# Patient Record
Sex: Male | Born: 1950
Health system: Southern US, Community
[De-identification: ages and names within clinical notes are randomized; demographics above are authoritative.]

## PROBLEM LIST (undated history)

## (undated) DIAGNOSIS — Z860100 Personal history of colon polyps, unspecified: Secondary | ICD-10-CM

## (undated) DIAGNOSIS — T7840XA Allergy, unspecified, initial encounter: Secondary | ICD-10-CM

## (undated) DIAGNOSIS — C801 Malignant (primary) neoplasm, unspecified: Secondary | ICD-10-CM

## (undated) DIAGNOSIS — E119 Type 2 diabetes mellitus without complications: Secondary | ICD-10-CM

## (undated) DIAGNOSIS — L989 Disorder of the skin and subcutaneous tissue, unspecified: Secondary | ICD-10-CM

## (undated) DIAGNOSIS — E785 Hyperlipidemia, unspecified: Secondary | ICD-10-CM

## (undated) DIAGNOSIS — N529 Male erectile dysfunction, unspecified: Secondary | ICD-10-CM

## (undated) DIAGNOSIS — D649 Anemia, unspecified: Secondary | ICD-10-CM

## (undated) DIAGNOSIS — I10 Essential (primary) hypertension: Secondary | ICD-10-CM

## (undated) DIAGNOSIS — K219 Gastro-esophageal reflux disease without esophagitis: Secondary | ICD-10-CM

## (undated) DIAGNOSIS — J309 Allergic rhinitis, unspecified: Secondary | ICD-10-CM

## (undated) DIAGNOSIS — M1990 Unspecified osteoarthritis, unspecified site: Secondary | ICD-10-CM

## (undated) DIAGNOSIS — E669 Obesity, unspecified: Secondary | ICD-10-CM

## (undated) HISTORY — DX: Essential (primary) hypertension: I10

## (undated) HISTORY — PX: TONSILLECTOMY AND ADENOIDECTOMY: SHX28

## (undated) HISTORY — DX: Personal history of colonic polyps: Z86.010

## (undated) HISTORY — DX: Anemia, unspecified: D64.9

## (undated) HISTORY — DX: Male erectile dysfunction, unspecified: N52.9

## (undated) HISTORY — DX: Gastro-esophageal reflux disease without esophagitis: K21.9

## (undated) HISTORY — DX: Type 2 diabetes mellitus without complications: E11.9

## (undated) HISTORY — PX: POLYPECTOMY: SHX149

## (undated) HISTORY — PX: HERNIA REPAIR: SHX51

## (undated) HISTORY — PX: EYE SURGERY: SHX253

## (undated) HISTORY — DX: Obesity, unspecified: E66.9

## (undated) HISTORY — DX: Allergy, unspecified, initial encounter: T78.40XA

## (undated) HISTORY — DX: Hyperlipidemia, unspecified: E78.5

## (undated) HISTORY — PX: APPENDECTOMY: SHX54

## (undated) HISTORY — DX: Malignant (primary) neoplasm, unspecified: C80.1

## (undated) HISTORY — DX: Unspecified osteoarthritis, unspecified site: M19.90

## (undated) HISTORY — DX: Personal history of colon polyps, unspecified: Z86.0100

## (undated) HISTORY — DX: Allergic rhinitis, unspecified: J30.9

## (undated) HISTORY — PX: JOINT REPLACEMENT: SHX530

## (undated) HISTORY — DX: Disorder of the skin and subcutaneous tissue, unspecified: L98.9

## (undated) HISTORY — PX: REPLACEMENT TOTAL KNEE: SUR1224

---

## 1990-08-17 HISTORY — PX: OTHER SURGICAL HISTORY: SHX169

## 2002-01-06 ENCOUNTER — Ambulatory Visit (HOSPITAL_COMMUNITY): Admission: RE | Admit: 2002-01-06 | Discharge: 2002-01-06 | Payer: Self-pay | Admitting: Gastroenterology

## 2002-01-06 ENCOUNTER — Encounter (INDEPENDENT_AMBULATORY_CARE_PROVIDER_SITE_OTHER): Payer: Self-pay

## 2003-04-24 ENCOUNTER — Encounter: Admission: RE | Admit: 2003-04-24 | Discharge: 2003-07-23 | Payer: Self-pay | Admitting: Family Medicine

## 2004-04-26 ENCOUNTER — Emergency Department (HOSPITAL_COMMUNITY): Admission: EM | Admit: 2004-04-26 | Discharge: 2004-04-26 | Payer: Self-pay | Admitting: Emergency Medicine

## 2006-08-17 DIAGNOSIS — C801 Malignant (primary) neoplasm, unspecified: Secondary | ICD-10-CM

## 2006-08-17 HISTORY — DX: Malignant (primary) neoplasm, unspecified: C80.1

## 2006-08-17 HISTORY — PX: OTHER SURGICAL HISTORY: SHX169

## 2006-09-23 ENCOUNTER — Ambulatory Visit (HOSPITAL_BASED_OUTPATIENT_CLINIC_OR_DEPARTMENT_OTHER): Admission: RE | Admit: 2006-09-23 | Discharge: 2006-09-23 | Payer: Self-pay | Admitting: Orthopedic Surgery

## 2007-04-14 DIAGNOSIS — N529 Male erectile dysfunction, unspecified: Secondary | ICD-10-CM

## 2007-04-14 DIAGNOSIS — L989 Disorder of the skin and subcutaneous tissue, unspecified: Secondary | ICD-10-CM

## 2007-04-14 HISTORY — DX: Disorder of the skin and subcutaneous tissue, unspecified: L98.9

## 2007-04-14 HISTORY — DX: Male erectile dysfunction, unspecified: N52.9

## 2007-04-20 ENCOUNTER — Ambulatory Visit: Payer: Self-pay | Admitting: Family Medicine

## 2007-08-18 HISTORY — PX: COLONOSCOPY: SHX174

## 2007-08-18 LAB — HM COLONOSCOPY

## 2010-06-20 ENCOUNTER — Encounter: Admission: RE | Admit: 2010-06-20 | Discharge: 2010-06-25 | Payer: Self-pay | Admitting: Orthopedic Surgery

## 2010-06-25 ENCOUNTER — Encounter: Payer: Self-pay | Admitting: Orthopedic Surgery

## 2010-07-17 ENCOUNTER — Encounter: Payer: Self-pay | Admitting: Orthopedic Surgery

## 2010-08-17 ENCOUNTER — Encounter: Payer: Self-pay | Admitting: Orthopedic Surgery

## 2011-01-02 NOTE — Op Note (Signed)
Joseph Collier, Joseph Collier                ACCOUNT NO.:  [id number]   MEDICAL RECORD NO.:  [id number]          PATIENT TYPE:  AMB   LOCATION:  DSC                          FACILITY:  [facility]   PHYSICIAN:  Rodney A. Mortenson, M.D.DATE OF BIRTH:  [birth year]   DATE OF PROCEDURE:  09/23/2006  DATE OF DISCHARGE:                               OPERATIVE REPORT   JUSTIFICATION:  A 60 year old male who has had intermittent problems  with his left knee for a number of years, but several months ago as he  got out of a chair, he had sudden severe pain along the medial joint  line.  This has persisted along the joint line especially with  weightbearing.  He has trouble going up and down stairs.  Examination  shows hyperflexion of the left knee causes severe pain along medial  joint line.  An MRI was done that shows a vertical displaced tear of the  body and posterior horn of the medial meniscus and displaced fragments.  There is tricompartmental degenerative changes, marked degree in the  medial tibial femoral space.  Because of persistent pain and discomfort,  it is felt that arthroscopic evaluation and treatment was indicated and  necessary.  It was explained to him the arthritic problems could not be  addressed through the arthroscope, but the torn meniscus can be  addressed.  Questions answered and encouraged.  Complications discussed  extensively.  The patient wishes to proceed.   JUSTIFICATION FOR OUTPATIENT SURGERY:  Minimal morbidity.   PREOPERATIVE DIAGNOSIS:  Tear medial of meniscus, left knee.   POSTOPERATIVE DIAGNOSIS:  Complex tear, medial meniscus, left knee;  severe osteoarthritis, medial femoral condyle, left knee.   OPERATION:  Arthroscopy, subtotal medial meniscectomy and debridement of  femoral condyle and tibial plateau.   SURGEON:  Lenard Galloway. Chaney Malling, M.D.   ANESTHESIA:  MAC.   PATHOLOGY:  With the arthroscope in the knee, careful examination was  undertaken.  Patellofemoral  joint appeared fairly normal.  In the  lateral compartment there was a divot in the lateral femoral condyle but  this had a base of some cartilage.  The lateral meniscus appeared normal  as did the lateral tibial plateau.  The ACL was normal.  In the medial  compartment there was total loss of articular cartilage of the  weightbearing area of the medial femoral condyle and the medial tibial  plateau.  There is extensive tearing of the posterior one-half of the  medial meniscus, very complex and multiple tears in multiple directions.   PROCEDURE:  The patient placed on the operating table in supine position  with a pneumatic tourniquet about the left upper thigh.  Left leg placed  in the leg holder.  The entire left lower extremity prepped with  DuraPrep, draped out in the usual manner.  Findings described above.  Infusion cannula was placed in the superior medial pouch and the knee  distended with saline.  Anteromedial, anterolateral portals made.  The  arthroscope was introduced.  Most of the pathology was seen in the  medial compartment.  Through both medial and lateral portals  a series of  baskets was inserted and a very aggressive debridement of the complex  tear of the posterior half the medial meniscus was accomplished.  This  was followed with intra-articular shaver.  A smooth stable rim was then  remaining.  All debris was removed.  A chondroplasty was done of the  medial femoral condyle and medial tibial plateau where was total loss of  articular cartilage and fraying of cartilage off the margins of the  lesion.  Once this was accomplished to my satisfaction, the knee was  filled with Marcaine.  A large bulky pressure compressive dressing  applied.  The patient then returned recovery room in excellent  condition.  Technically this procedure went extremely well.  Some time  in the future, this patient may require a total knee.   DISPOSITION:  1. To my office on Wednesday.  2.  Percocet for pain.           ______________________________  Lenard Galloway. Chaney Malling, M.D.     RAM/MEDQ  D:  09/23/2006  T:  09/23/2006  Job:  [id number]

## 2011-01-02 NOTE — Op Note (Signed)
[facility]  Patient:    Joseph Collier, Joseph Collier Visit Number: [id number] MRN: [id number]          Service Type: END Location: ENDO Attending Physician:  Louie Bun Dictated by:   Everardo All Madilyn Fireman, M.D. Proc. Date: 01/06/02 Admit Date:  01/06/2002 Discharge Date: 01/06/2002   CC:         Dr. Viviann Spare Dobb   Operative Report  INDICATIONS FOR PROCEDURE:  The patient with history of recent rectal bleeding, diarrhea. He is 60 years old and has had no prior colon screening.  PROCEDURE:  The patient was placed in the left lateral decubitus position and placed on the pulse monitor with continuous low-flow oxygen delivered by nasal cannula. He was sedated with 60 mg IV Demerol and 7 mg IV Versed. The Olympus video colonoscope is inserted into the rectum and advanced to the cecum, confirmed by transillumination of McBurneys point and visualization of the ileocecal valve and appendiceal orifice. The prep was excellent. The cecum appeared normal. In the ascending colon there was a sessile, irregular shaped polyp that was removed by snare. This was approximately 1.2 x 0.6 cm in diameter and there was an adjacent 0.6 cm polyp that was fulgurated by hot biopsy. The remainder of the ascending, transverse, descending, sigmoid, and rectum all appeared normal with no further polyps, masses, diverticula, or other mucosal abnormalities. Retroflex view of the anus revealed no obvious internal hemorrhoids. The colonoscope is then withdrawn and the patient returned to the recovery room in stable condition. He tolerated the procedure well and there were no immediate complications.  IMPRESSION:  Two ascending colon polyps were removed by snare and hot biopsy respectively.  PLAN:  Await histology for determination of method and interval for future colon screening. Dictated by:   Everardo All Madilyn Fireman, M.D. Attending Physician:  Louie Bun DD:  01/06/02 TD:  01/09/02 Job:  [id number]

## 2011-09-15 LAB — HEMOGLOBIN A1C: Hgb A1c MFr Bld: 6.1 % — AB (ref 4.0–6.0)

## 2011-09-15 LAB — LIPID PANEL
HDL: 39 mg/dL (ref 35–70)
Triglycerides: 163 mg/dL — AB (ref 40–160)

## 2011-10-07 DIAGNOSIS — Z9889 Other specified postprocedural states: Secondary | ICD-10-CM | POA: Insufficient documentation

## 2012-04-20 ENCOUNTER — Encounter: Payer: Self-pay | Admitting: *Deleted

## 2012-04-21 ENCOUNTER — Encounter: Payer: Self-pay | Admitting: Family Medicine

## 2012-04-25 ENCOUNTER — Encounter: Payer: Self-pay | Admitting: Family Medicine

## 2012-04-25 ENCOUNTER — Ambulatory Visit (INDEPENDENT_AMBULATORY_CARE_PROVIDER_SITE_OTHER): Payer: 59 | Admitting: Family Medicine

## 2012-04-25 VITALS — BP 126/72 | HR 70 | Temp 98.2°F | Resp 16 | Ht 69.0 in | Wt 255.0 lb

## 2012-04-25 DIAGNOSIS — Z23 Encounter for immunization: Secondary | ICD-10-CM

## 2012-04-25 DIAGNOSIS — E7800 Pure hypercholesterolemia, unspecified: Secondary | ICD-10-CM

## 2012-04-25 DIAGNOSIS — R209 Unspecified disturbances of skin sensation: Secondary | ICD-10-CM

## 2012-04-25 DIAGNOSIS — I10 Essential (primary) hypertension: Secondary | ICD-10-CM

## 2012-04-25 DIAGNOSIS — E119 Type 2 diabetes mellitus without complications: Secondary | ICD-10-CM

## 2012-04-25 DIAGNOSIS — R202 Paresthesia of skin: Secondary | ICD-10-CM

## 2012-04-25 DIAGNOSIS — E785 Hyperlipidemia, unspecified: Secondary | ICD-10-CM

## 2012-04-25 LAB — CBC WITH DIFFERENTIAL/PLATELET
Basophils Absolute: 0.1 10*3/uL (ref 0.0–0.1)
Basophils Relative: 1 % (ref 0–1)
HCT: 39.4 % (ref 39.0–52.0)
Hemoglobin: 13.2 g/dL (ref 13.0–17.0)
Lymphocytes Relative: 24 % (ref 12–46)
Lymphs Abs: 2.1 10*3/uL (ref 0.7–4.0)
MCHC: 33.5 g/dL (ref 30.0–36.0)
MCV: 88.5 fL (ref 78.0–100.0)
Neutro Abs: 5.5 10*3/uL (ref 1.7–7.7)
Neutrophils Relative %: 63 % (ref 43–77)
Platelets: 346 10*3/uL (ref 150–400)

## 2012-04-25 LAB — POCT GLYCOSYLATED HEMOGLOBIN (HGB A1C): Hemoglobin A1C: 6.5

## 2012-04-25 NOTE — Progress Notes (Signed)
[address]   [place], [place]  [address]   [phone]  Subjective:    Patient ID: Joseph Collier, male    DOB: [birth year], 61 y.o.   MRN: [id number]  HPI This 61 y.o. male presents for evaluation of the following:    1.  DMII: Last visit three months ago; management at last visit included increasing Glipizide to 10mg  daily.  No side effects to medications; good compliance with medications; good symptoms control.  Not checking sugars since last visit.  Three meters but not checking.  Did not schedule eye exam but will schedule after visit.    2.  Dyslipidemia: Three month follow-up; no changes in management at last visit.  Elevated triglycerides; taking Fish Oil one daily.  Fasting.  Denies CP/palp/SOB/leg swelling/diaphoresis; denies HA/focal weakness/paresthesias/dizziness.  Reports good compliance with medication, good symptom control, and good tolerance of medication.  3.  HTN:  Three month follow-up.  No changes made to management at last visit.  Not checking blood pressure at home.  Good compliance with medication; good tolerance to medication; good symptom control.   4.  Elevated LFTs: New at last visit; advised to decrease alcohol intake and avoid acetaminophen.   Decreased alcohol after last visit; decreased to 3 drinks per week.  Did start exercising; walking some; also has project.   5.  B tingling arms: three month follow-up.   intermittent; cannot cause to occur.  B fingers.  No weakness.  No associated neck pain.    Review of Systems  Constitutional: Negative for fever, chills and fatigue.  HENT: Negative for neck pain and neck stiffness.   Respiratory: Negative for cough, chest tightness, shortness of breath, wheezing and stridor.   Cardiovascular: Negative for chest pain, palpitations and leg swelling.  Gastrointestinal: Negative for nausea, vomiting, abdominal pain, diarrhea and constipation.  Skin: Negative for color change, pallor and rash.  Neurological: Positive  for numbness. Negative for dizziness, syncope, facial asymmetry, weakness, light-headedness and headaches.    Past Medical History  Diagnosis Date  . Type II or unspecified type diabetes mellitus without mention of complication, not stated as uncontrolled   . Essential hypertension, benign   . Other and unspecified hyperlipidemia   . Skin lesion 04/14/2007    melanoma  . Contact with or exposure to unspecified communicable disease   . Anemia, unspecified   . Ingrowing nail   . Obesity, unspecified   . Esophageal reflux   . Impotence of organic origin 04/14/2007  . Tobacco use disorder   . Allergic rhinitis, cause unspecified   . Personal history of colonic polyps   . Essential hypertension, benign     Past Surgical History  Procedure Date  . Appendectomy   . Hernia repair     age 76  . Melanoma of forearm resection 1992    Left        [name]  . Tonsillectomy and adenoidectomy   . Arthroscopy of knee 2008    Left Knee    Prior to Admission medications   Medication Sig Start Date End Date Taking? Authorizing Provider  aspirin EC 81 MG tablet Take 81 mg by mouth daily.   Yes Historical Provider, MD  cetirizine (ZYRTEC) 10 MG tablet Take 10 mg by mouth daily as needed.   Yes Historical Provider, MD  glipiZIDE (GLUCOTROL XL) 5 MG 24 hr tablet Take 5 mg by mouth daily.   Yes Historical Provider, MD  lisinopril (PRINIVIL,ZESTRIL) 40 MG tablet Take 40 mg  by mouth daily.   Yes Historical Provider, MD  Saxagliptin-Metformin (KOMBIGLYZE XR) 2.12-998 MG TB24 Take by mouth. Two daily with evening meal.   Yes Historical Provider, MD  simvastatin (ZOCOR) 20 MG tablet Take 20 mg by mouth every other day.   Yes Historical Provider, MD  tadalafil (CIALIS) 20 MG tablet Take 20 mg by mouth daily as needed.   Yes Historical Provider, MD    Allergies  Allergen Reactions  . Penicillins Hives and Swelling  . Tetracyclines & Related Hives and Swelling    History   Social History  . Marital  Status: Married    Spouse Name: N/A    Number of Children: 1  . Years of Education: 12   Occupational History  . outside sales     engineered woods since 2000   Social History Main Topics  . Smoking status: Former Smoker -- 1.0 packs/day for 25 years    Types: Cigarettes  . Smokeless tobacco: Not on file   Comment: quit 1991  . Alcohol Use: Yes     moderate: drinks hard liquor 7 drinks per month in 08/2011  . Drug Use: No  . Sexually Active: Not on file   Other Topics Concern  . Not on file   Social History Narrative   Married x 22 years, happily, second marriage. 1 son 79 yo. Exercise: Light, walking, 3 times a week. Caffeine use: carbonated beverages; consumes a minimal amount. Always uses seat belts, Guns in the home stored in locked cabinet. Smoke Alarms in the home.    Family History  Problem Relation Age of Onset  . Coronary artery disease Father   . Hypertension Father   . Cancer Mother   . Hypertension Brother        Objective:   Physical Exam  Nursing note and vitals reviewed. Constitutional: He is oriented to person, place, and time. He appears well-developed and well-nourished. No distress.  HENT:  Head: Normocephalic and atraumatic.  Eyes: Conjunctivae normal and EOM are normal. Pupils are equal, round, and reactive to light.  Neck: Normal range of motion. Neck supple. No thyromegaly present.  Cardiovascular: Normal rate, regular rhythm, normal heart sounds and intact distal pulses.  Exam reveals no gallop and no friction rub.   No murmur heard. Pulmonary/Chest: Effort normal and breath sounds normal. No respiratory distress. He has no wheezes. He has no rales.  Abdominal: Soft. Bowel sounds are normal. He exhibits no distension. There is no tenderness. There is no rebound and no guarding.  Musculoskeletal: Normal range of motion. He exhibits no edema and no tenderness.       Cervical back: He exhibits normal range of motion, no tenderness, no bony  tenderness, no swelling, no pain and no spasm.       FULL ROM CERVICAL SPINE WITHOUT PAIN OR LIMITATION; FULL ROM B SHOULDERS WITHOUT PAIN OR LIMITATION; MOTOR 5/5 BUE; GRIP 5/5.  TINEL AND PHALENS NEGATIVE.  Lymphadenopathy:    He has no cervical adenopathy.  Neurological: He is alert and oriented to person, place, and time. He has normal reflexes. No cranial nerve deficit. He exhibits normal muscle tone. Coordination normal.  Skin: Skin is warm and dry. He is not diaphoretic.  Psychiatric: He has a normal mood and affect. His behavior is normal. Judgment and thought content normal.      INFLUENZA VACCINE ADMINISTERED.    Assessment & Plan:   1. Type II or unspecified type diabetes mellitus without mention of complication, not  stated as uncontrolled  POCT glycosylated hemoglobin (Hb A1C)  2. Essential hypertension, benign  CBC with Differential, Comprehensive metabolic panel, CK  3. Other and unspecified hyperlipidemia  Lipid panel  4. Paresthesias    5.  Elevated LFTs 6.  Flu vaccine  1.  DMII: controlled; no change in medications; obtain labs. F/u 3 months. 2.  HTN: controlled; no change in medications; obtain labs.  Goal BP< 130/80 due DMII. 3.  Hyperlipidemia: controlled; no change in medications; obtain labs. 4.  Paresthesias B hand: persistent but intermittent.  Pt declined Cervical spine imaging, referral for NCS.  If worsens, will warrant further evaluation.  5. Liver function tests elevated: New at last visit; repeat today; likely secondary to alcohol overuse.  Has decreased intake of alcohol since last visit and has also lost weight. 6.  S/p flu vaccine.

## 2012-04-26 LAB — COMPREHENSIVE METABOLIC PANEL
AST: 17 U/L (ref 0–37)
Albumin: 4.2 g/dL (ref 3.5–5.2)
Alkaline Phosphatase: 39 U/L (ref 39–117)
CO2: 26 mEq/L (ref 19–32)
Calcium: 9.8 mg/dL (ref 8.4–10.5)
Creat: 0.89 mg/dL (ref 0.50–1.35)
Glucose, Bld: 141 mg/dL — ABNORMAL HIGH (ref 70–99)
Potassium: 4.7 mEq/L (ref 3.5–5.3)
Sodium: 137 mEq/L (ref 135–145)
Total Bilirubin: 0.3 mg/dL (ref 0.3–1.2)
Total Protein: 6.6 g/dL (ref 6.0–8.3)

## 2012-04-26 LAB — LIPID PANEL: Total CHOL/HDL Ratio: 2.5 Ratio

## 2012-05-04 NOTE — Progress Notes (Signed)
Reviewed and agree.

## 2012-07-25 ENCOUNTER — Ambulatory Visit: Payer: 59 | Admitting: Family Medicine

## 2012-09-05 ENCOUNTER — Encounter: Payer: Self-pay | Admitting: Family Medicine

## 2012-09-05 ENCOUNTER — Ambulatory Visit (INDEPENDENT_AMBULATORY_CARE_PROVIDER_SITE_OTHER): Payer: 59 | Admitting: Family Medicine

## 2012-09-05 VITALS — BP 131/97 | HR 94 | Temp 98.4°F | Resp 16 | Ht 69.5 in | Wt 259.0 lb

## 2012-09-05 DIAGNOSIS — R05 Cough: Secondary | ICD-10-CM

## 2012-09-05 DIAGNOSIS — E7800 Pure hypercholesterolemia, unspecified: Secondary | ICD-10-CM

## 2012-09-05 DIAGNOSIS — E1169 Type 2 diabetes mellitus with other specified complication: Secondary | ICD-10-CM | POA: Insufficient documentation

## 2012-09-05 DIAGNOSIS — E119 Type 2 diabetes mellitus without complications: Secondary | ICD-10-CM

## 2012-09-05 DIAGNOSIS — M79675 Pain in left toe(s): Secondary | ICD-10-CM

## 2012-09-05 DIAGNOSIS — L600 Ingrowing nail: Secondary | ICD-10-CM

## 2012-09-05 DIAGNOSIS — E785 Hyperlipidemia, unspecified: Secondary | ICD-10-CM | POA: Insufficient documentation

## 2012-09-05 DIAGNOSIS — I10 Essential (primary) hypertension: Secondary | ICD-10-CM | POA: Insufficient documentation

## 2012-09-05 LAB — LIPID PANEL
Cholesterol: 107 mg/dL (ref 0–200)
LDL Cholesterol: 49 mg/dL (ref 0–99)
Total CHOL/HDL Ratio: 2.9 Ratio
Triglycerides: 105 mg/dL (ref <150)

## 2012-09-05 LAB — COMPREHENSIVE METABOLIC PANEL
AST: 20 U/L (ref 0–37)
Albumin: 4.6 g/dL (ref 3.5–5.2)
BUN: 18 mg/dL (ref 6–23)
CO2: 23 mEq/L (ref 19–32)
Calcium: 9.8 mg/dL (ref 8.4–10.5)
Creat: 1.01 mg/dL (ref 0.50–1.35)
Potassium: 4.9 mEq/L (ref 3.5–5.3)
Sodium: 140 mEq/L (ref 135–145)
Total Bilirubin: 0.6 mg/dL (ref 0.3–1.2)
Total Protein: 6.9 g/dL (ref 6.0–8.3)

## 2012-09-05 LAB — CBC WITH DIFFERENTIAL/PLATELET
Basophils Absolute: 0.1 10*3/uL (ref 0.0–0.1)
Eosinophils Relative: 2 % (ref 0–5)
Hemoglobin: 14.8 g/dL (ref 13.0–17.0)
MCH: 29.7 pg (ref 26.0–34.0)
MCHC: 34.6 g/dL (ref 30.0–36.0)
RBC: 4.99 MIL/uL (ref 4.22–5.81)

## 2012-09-05 LAB — HEMOGLOBIN A1C: Hgb A1c MFr Bld: 7 % — ABNORMAL HIGH (ref <5.7)

## 2012-09-05 MED ORDER — AZITHROMYCIN 250 MG PO TABS: ORAL_TABLET | ORAL | Status: DC

## 2012-09-05 MED ORDER — FLUTICASONE PROPIONATE 50 MCG/ACT NA SUSP: 2.0000 | Freq: Every day | NASAL | Status: DC

## 2012-09-05 NOTE — Assessment & Plan Note (Signed)
New.  RTC tomorrow for nail resection.  Continue with daily soaks.

## 2012-09-05 NOTE — Progress Notes (Signed)
[address]   [place], [place]  [address]   [phone]  Subjective:    Patient ID: Joseph Collier, male    DOB: Sep 15, 1950, 62 y.o.   MRN: [id number]  HPIThis 62 y.o. male presents for evaluation of the following:  1.  Ingrown Toenail:  Intermittent issue for past two years.  Recurrent issue over past five months ago.  L first toe.  Soaking and cleaning it; soaking every third day.  Very painful; bleeding throughout the day.  2.  DMII;  Three month follow-up; checking sugars sporadically 104-140 fasting and random.  S/p flu vaccine last visit.  Denies polyuria, polydipsia.  3.  HTN:  Elevated this morning.  Stressful morning.  Three month follow-up.  No changes to management made at last visit.  Not checking blood pressure at home.  Reports compliance with medication; good tolerance to medication; good symptom control.  Denies chest pain, palpitations, shortness of breath, leg swelling. Denies HA, dizziness, focal weakness, paresthesias.  4.  Elevated LFTs: resolved after last visit.  Has decreased alcohol intake.  5.  Hyperlipidemia:  Fasting this morning.  Three month follow-up; no changes to management made at last visit.  Good compliance; good tolerance; good symptom control.  6.  Coughing:  Cough up sputum; onset two months ago.  Intermittent issue.  Dark yellow.  Daily event.  No fever/chills/sweats.  Occurs in morning and sometimes in afternoon.  No nasal congestion; no rhinorrhea.  No SOB.  No hemoptysis.  No sinus pressure.  +ear congestion.  Similar issues last year and then resolved.      Review of Systems  Constitutional: Negative for fever, chills, diaphoresis and fatigue.  HENT: Positive for congestion and postnasal drip. Negative for rhinorrhea.   Eyes: Negative for visual disturbance.  Respiratory: Positive for cough. Negative for shortness of breath, wheezing and stridor.   Cardiovascular: Negative for chest pain, palpitations and leg swelling.  Gastrointestinal:  Negative for nausea and vomiting.  Skin: Positive for color change and wound. Negative for pallor and rash.  Neurological: Positive for numbness. Negative for dizziness, syncope, facial asymmetry, speech difficulty, weakness, light-headedness and headaches.       Persistent intermittent tingling L hand; unchanged from last visit.        Past Medical History  Diagnosis Date  . Type II or unspecified type diabetes mellitus without mention of complication, not stated as uncontrolled   . Essential hypertension, benign   . Other and unspecified hyperlipidemia   . Skin lesion 04/14/2007    melanoma  . Contact with or exposure to unspecified communicable disease   . Anemia, unspecified   . Ingrowing nail   . Obesity, unspecified   . Esophageal reflux   . Impotence of organic origin 04/14/2007  . Tobacco use disorder   . Allergic rhinitis, cause unspecified   . Personal history of colonic polyps   . Essential hypertension, benign     Past Surgical History  Procedure Date  . Appendectomy   . Hernia repair     age 71  . Melanoma of forearm resection 1992    Left        [name]  . Tonsillectomy and adenoidectomy   . Arthroscopy of knee 2008    Left Knee    Prior to Admission medications   Medication Sig Start Date End Date Taking? Authorizing Provider  aspirin EC 81 MG tablet Take 81 mg by mouth daily.   Yes Historical Provider, MD  cetirizine (ZYRTEC)  10 MG tablet Take 10 mg by mouth daily as needed.   Yes Historical Provider, MD  glipiZIDE (GLUCOTROL XL) 5 MG 24 hr tablet Take 5 mg by mouth daily.   Yes Historical Provider, MD  lisinopril (PRINIVIL,ZESTRIL) 40 MG tablet Take 40 mg by mouth daily.   Yes Historical Provider, MD  Saxagliptin-Metformin (KOMBIGLYZE XR) 2.12-998 MG TB24 Take by mouth. Two daily with evening meal.   Yes Historical Provider, MD  simvastatin (ZOCOR) 20 MG tablet Take 20 mg by mouth every other day.   Yes Historical Provider, MD  tadalafil (CIALIS) 20 MG  tablet Take 20 mg by mouth daily as needed.   Yes Historical Provider, MD  azithromycin (ZITHROMAX) 250 MG tablet Two tablets po q d x 1 day, then one tablet daily x 4 days 09/05/12   Ethelda Chick, MD  fluticasone [facility]) 50 MCG/ACT nasal spray Place 2 sprays into the nose daily. 09/05/12   Ethelda Chick, MD    Allergies  Allergen Reactions  . Penicillins Hives and Swelling  . Tetracyclines & Related Hives and Swelling    History   Social History  . Marital Status: Married    Spouse Name: N/A    Number of Children: 1  . Years of Education: 12   Occupational History  . outside sales     engineered woods since 2000   Social History Main Topics  . Smoking status: Former Smoker -- 1.0 packs/day for 25 years    Types: Cigarettes  . Smokeless tobacco: Not on file     Comment: quit 1991  . Alcohol Use: Yes     Comment: moderate: drinks hard liquor 7 drinks per month in 08/2011  . Drug Use: No  . Sexually Active: Not on file   Other Topics Concern  . Not on file   Social History Narrative   Married x 22 years, happily, second marriage. 1 son 1 yo. Exercise: Light, walking, 3 times a week. Caffeine use: carbonated beverages; consumes a minimal amount. Always uses seat belts, Guns in the home stored in locked cabinet. Smoke Alarms in the home.    Family History  Problem Relation Age of Onset  . Coronary artery disease Father   . Hypertension Father   . Cancer Mother   . Hypertension Brother     Objective:   Physical Exam  Nursing note and vitals reviewed. Constitutional: He is oriented to person, place, and time. He appears well-developed and well-nourished. No distress.  HENT:  Head: Normocephalic and atraumatic.  Right Ear: External ear normal.  Left Ear: External ear normal.  Nose: Nose normal.  Mouth/Throat: Oropharynx is clear and moist.  Eyes: Conjunctivae normal and EOM are normal. Pupils are equal, round, and reactive to light.  Neck: Normal range of  motion. Neck supple. No JVD present. No thyromegaly present.  Cardiovascular: Normal rate, regular rhythm, normal heart sounds and intact distal pulses.  Exam reveals no gallop and no friction rub.   No murmur heard. Pulmonary/Chest: Effort normal and breath sounds normal. He has no wheezes. He has no rales.  Lymphadenopathy:    He has no cervical adenopathy.  Neurological: He is alert and oriented to person, place, and time. No cranial nerve deficit. He exhibits normal muscle tone. Coordination normal.  Skin: He is not diaphoretic. There is erythema.       First toenail with swelling, erythema, no active drainage along lateral edge of nail L.  Assessment & Plan:   1. Type II or unspecified type diabetes mellitus without mention of complication, not stated as uncontrolled  Hemoglobin A1c  2. Pure hypercholesterolemia  Lipid panel  3. Essential hypertension, benign  CBC with Differential, CK, Comprehensive metabolic panel  4. Pain in toe of left foot    5. Ingrown toenail    6. Cough

## 2012-09-05 NOTE — Assessment & Plan Note (Signed)
Moderately controlled today; obtain labs; no change in medication today; if remains elevated, will warrant additional agent.

## 2012-09-05 NOTE — Assessment & Plan Note (Signed)
Controlled; obtain labs; continue current medications. 

## 2012-09-05 NOTE — Assessment & Plan Note (Signed)
New. Duration two months; secondary to sinusitis, PND.  Rx for Flonase, Zpack provided.

## 2012-09-05 NOTE — Progress Notes (Signed)
Reviewed and agree.

## 2012-09-05 NOTE — Patient Instructions (Addendum)
1. Type II or unspecified type diabetes mellitus without mention of complication, not stated as uncontrolled  Hemoglobin A1c  2. Pure hypercholesterolemia  Lipid panel  3. Essential hypertension, benign  CBC with Differential, CK, Comprehensive metabolic panel  4. Pain in toe of left foot    5. Ingrown toenail    6. Cough

## 2012-09-05 NOTE — Assessment & Plan Note (Signed)
New. Secondary to ingrown toenail.

## 2012-09-06 ENCOUNTER — Ambulatory Visit (INDEPENDENT_AMBULATORY_CARE_PROVIDER_SITE_OTHER): Payer: 59 | Admitting: Family Medicine

## 2012-09-06 ENCOUNTER — Encounter: Payer: Self-pay | Admitting: Family Medicine

## 2012-09-06 VITALS — BP 118/74 | HR 91 | Temp 98.7°F | Resp 16 | Ht 70.0 in | Wt 262.2 lb

## 2012-09-06 DIAGNOSIS — L600 Ingrowing nail: Secondary | ICD-10-CM

## 2012-09-06 MED ORDER — HYDROCODONE-ACETAMINOPHEN 5-325 MG PO TABS: ORAL_TABLET | ORAL | Status: DC

## 2012-09-06 NOTE — Patient Instructions (Addendum)
1. Ingrown toenail     1.  Keep bandage on foot during the day for the next week. 2.  Keep yellow gauze on toenail for next ten days.  If yellow gauze falls off before 10 days, it is fine; I would keep bandaid covering the nailbed during the day for one week.   3.  Return immediately for increasing pain, increasing swelling. 4.  Elevate foot while at rest for next 72 hours. 5.  Remove bandage before showering; leave yellow gauze in place with showers.  Avoid taking bath for next week.

## 2012-09-06 NOTE — Progress Notes (Signed)
   [address]   [place], [place]  [address]   [phone]  Subjective:    Patient ID: Joseph Collier, male    DOB: 1950-11-24, 62 y.o.   MRN: [id number]  HPIThis 62 y.o. male presents for toenail resection for ingrown toenail first toe.  Evaluated yesterday; scheduled for procedure today; swelling and pain has worsened since yesterday.     Review of Systems  Constitutional: Negative for fever, chills, diaphoresis and fatigue.  Skin: Positive for color change and wound. Negative for pallor and rash.       Objective:   Physical Exam  Constitutional: He appears well-developed and well-nourished. No distress.  Skin: He is not diaphoretic. There is erythema.       Lateral edge of toenail first with erythema, swelling, induration; no fluctuants.    Psychiatric: He has a normal mood and affect. His behavior is normal.     PROCEDURE: VERBAL CONSENT OBTAINED.  DIGITAL NERVE BLOCK TO FIRST TOE WITH 2% LIDOCAINE PLAIN AT BASE OF FIRST TOE.  TOE PREPPED IN STERILE FASHION.  TOENAIL REMOVER USED TO REMOVE FIRST TOENAIL IN TWO SEPARATE PIECES; PRESSURE APPLIED WITH GOOD HEMOSTASIS.  GAUZE APPLIED TO NAIL BED.  MEDIUM DRESSING PLACED BY CYNTHIA, CMA.  PT TOLERATED PROCEDURE WELL.       Assessment & Plan:   1. Ingrown toenail     1. Ingrown Toenail:  Persistent; s/p toenail resection full in office.  Local wound care reviewed in detail.  Rx for Hydrocodone 5/325 provided for PRN use.  RTC for fever, drainage, increasing pain, increasing swelling, or any concerns.

## 2012-09-07 ENCOUNTER — Encounter: Payer: Self-pay | Admitting: Family Medicine

## 2012-09-08 NOTE — Progress Notes (Signed)
Reviewed and agree.

## 2012-12-05 ENCOUNTER — Ambulatory Visit: Payer: 59 | Admitting: Family Medicine

## 2013-01-24 ENCOUNTER — Encounter: Payer: Self-pay | Admitting: Family Medicine

## 2013-01-24 ENCOUNTER — Ambulatory Visit (INDEPENDENT_AMBULATORY_CARE_PROVIDER_SITE_OTHER): Payer: 59 | Admitting: Family Medicine

## 2013-01-24 VITALS — BP 124/80 | HR 90 | Temp 98.6°F | Resp 16 | Ht 70.0 in | Wt 261.6 lb

## 2013-01-24 DIAGNOSIS — E119 Type 2 diabetes mellitus without complications: Secondary | ICD-10-CM

## 2013-01-24 DIAGNOSIS — I10 Essential (primary) hypertension: Secondary | ICD-10-CM

## 2013-01-24 DIAGNOSIS — E7800 Pure hypercholesterolemia, unspecified: Secondary | ICD-10-CM

## 2013-01-24 DIAGNOSIS — R0982 Postnasal drip: Secondary | ICD-10-CM

## 2013-01-24 DIAGNOSIS — K219 Gastro-esophageal reflux disease without esophagitis: Secondary | ICD-10-CM

## 2013-01-24 DIAGNOSIS — N529 Male erectile dysfunction, unspecified: Secondary | ICD-10-CM

## 2013-01-24 LAB — CBC WITH DIFFERENTIAL/PLATELET
Lymphs Abs: 2.3 10*3/uL (ref 0.7–4.0)
MCHC: 34.8 g/dL (ref 30.0–36.0)
Neutro Abs: 5.6 10*3/uL (ref 1.7–7.7)
Neutrophils Relative %: 62 % (ref 43–77)
RDW: 13.5 % (ref 11.5–15.5)
WBC: 9.1 10*3/uL (ref 4.0–10.5)

## 2013-01-24 LAB — COMPREHENSIVE METABOLIC PANEL
BUN: 16 mg/dL (ref 6–23)
Calcium: 10.7 mg/dL — ABNORMAL HIGH (ref 8.4–10.5)
Creat: 0.99 mg/dL (ref 0.50–1.35)
Glucose, Bld: 237 mg/dL — ABNORMAL HIGH (ref 70–99)
Sodium: 136 mEq/L (ref 135–145)

## 2013-01-24 LAB — LIPID PANEL
Cholesterol: 118 mg/dL (ref 0–200)
LDL Cholesterol: 33 mg/dL (ref 0–99)
VLDL: 50 mg/dL — ABNORMAL HIGH (ref 0–40)

## 2013-01-24 LAB — HEMOGLOBIN A1C
Hgb A1c MFr Bld: 8.1 % — ABNORMAL HIGH (ref <5.7)
Mean Plasma Glucose: 186 mg/dL — ABNORMAL HIGH (ref <117)

## 2013-01-24 MED ORDER — TADALAFIL 20 MG PO TABS: 20.0000 mg | ORAL_TABLET | Freq: Every day | ORAL | Status: DC | PRN

## 2013-01-24 MED ORDER — LISINOPRIL 40 MG PO TABS: 40.0000 mg | ORAL_TABLET | Freq: Every day | ORAL | Status: DC

## 2013-01-24 MED ORDER — SAXAGLIPTIN-METFORMIN ER 2.5-1000 MG PO TB24: 2.0000 | ORAL_TABLET | Freq: Every day | ORAL | Status: DC

## 2013-01-24 MED ORDER — IPRATROPIUM BROMIDE 0.03 % NA SOLN: 2.0000 | Freq: Two times a day (BID) | NASAL | Status: DC

## 2013-01-24 MED ORDER — SIMVASTATIN 20 MG PO TABS: 20.0000 mg | ORAL_TABLET | ORAL | Status: DC

## 2013-01-24 MED ORDER — GLIPIZIDE ER 10 MG PO TB24: 10.0000 mg | ORAL_TABLET | Freq: Every day | ORAL | Status: DC

## 2013-01-24 MED ORDER — OMEPRAZOLE 20 MG PO CPDR: 20.0000 mg | DELAYED_RELEASE_CAPSULE | Freq: Every day | ORAL | Status: DC

## 2013-01-24 NOTE — Progress Notes (Signed)
[address]   [place], [place]  [address]   [phone]  Subjective:    Patient ID: Joseph Collier, male    DOB: 12-May-1951, 62 y.o.   MRN: [id number]  HPI This 62 y.o. male presents for six month follow-up:  1. HTN:  Not checking blood pressure at home.  No changes to management made at last visit.  Reports good compliance, good tolerance to medication; reports good symptom control. Denies CP/palp/SOB/leg swelling.  Not exercising.  2.  DMII:  Not checking sugars.  Reports compliance with medication; good tolerance to medication; good symptom control.  No recent eye exam; cancelled recent appointment due to insurance issues.  3. Hyperlipidemia:  Fasting.  Reports good compliance with medication; good tolerance to medication; good symptom control. Denies HA/dizziness/paresthesias/focal weakness.  4. Coughing: s/p Zpack and Flonase at last visit; no improvement at all.  Cough is better/gone.  Flonase did not slow down PND at all. Yellow drainage.  No nasal congestion; no rhinorrhea.  No ear pain or congestion.  No SOB.  Clearing throat due to PND.  Small amount of PND. Taking Zyrtec as needed; took Zyrtec in February without improvement in PND.   5. Heartburn: daily; increased drinking since last visit; drinking 6-7 drinks per day.     Review of Systems  Constitutional: Negative for fever, chills, diaphoresis, activity change, appetite change, fatigue and unexpected weight change.  HENT: Negative for ear pain, congestion, sore throat, rhinorrhea, sneezing, trouble swallowing, voice change and sinus pressure.   Respiratory: Negative for cough, shortness of breath, wheezing and stridor.   Cardiovascular: Negative for chest pain, palpitations and leg swelling.  Gastrointestinal: Negative for nausea, vomiting, abdominal pain, diarrhea, constipation, blood in stool, abdominal distention and anal bleeding.  Endocrine: Negative for cold intolerance, heat intolerance, polydipsia, polyphagia and  polyuria.  Skin: Negative for color change, pallor, rash and wound.  Neurological: Negative for dizziness, tremors, seizures, syncope, facial asymmetry, speech difficulty, weakness, light-headedness, numbness and headaches.        Past Medical History  Diagnosis Date  . Type II or unspecified type diabetes mellitus without mention of complication, not stated as uncontrolled   . Essential hypertension, benign   . Other and unspecified hyperlipidemia   . Skin lesion 04/14/2007    melanoma  . Contact with or exposure to unspecified communicable disease   . Anemia, unspecified   . Ingrowing nail   . Obesity, unspecified   . Esophageal reflux   . Impotence of organic origin 04/14/2007  . Tobacco use disorder   . Allergic rhinitis, cause unspecified   . Personal history of colonic polyps   . Essential hypertension, benign     Past Surgical History  Procedure Laterality Date  . Appendectomy    . Hernia repair      age 73  . Melanoma of forearm resection  1992    Left        [name]  . Tonsillectomy and adenoidectomy    . Arthroscopy of knee  2008    Left Knee    Prior to Admission medications   Medication Sig Start Date End Date Taking? Authorizing Provider  aspirin EC 81 MG tablet Take 81 mg by mouth daily.   Yes Historical Provider, MD  cetirizine (ZYRTEC) 10 MG tablet Take 10 mg by mouth daily as needed.   Yes Historical Provider, MD  glipiZIDE (GLUCOTROL XL) 5 MG 24 hr tablet Take 10 mg by mouth daily.    Yes Historical  Provider, MD  lisinopril (PRINIVIL,ZESTRIL) 40 MG tablet Take 40 mg by mouth daily.   Yes Historical Provider, MD  Saxagliptin-Metformin (KOMBIGLYZE XR) 2.12-998 MG TB24 Take by mouth. Two daily with evening meal.   Yes Historical Provider, MD  simvastatin (ZOCOR) 20 MG tablet Take 20 mg by mouth every other day.   Yes Historical Provider, MD  tadalafil (CIALIS) 20 MG tablet Take 20 mg by mouth daily as needed.   Yes Historical Provider, MD  fluticasone  (FLONASE) 50 MCG/ACT nasal spray Place 2 sprays into the nose daily. 09/05/12   Ethelda Chick, MD    Allergies  Allergen Reactions  . Penicillins Hives and Swelling  . Tetracyclines & Related Hives and Swelling    History   Social History  . Marital Status: Married    Spouse Name: N/A    Number of Children: 1  . Years of Education: 12   Occupational History  . outside sales     engineered woods since 2000   Social History Main Topics  . Smoking status: Former Smoker -- 1.00 packs/day for 25 years    Types: Cigarettes  . Smokeless tobacco: Not on file     Comment: quit 1991  . Alcohol Use: Yes     Comment: moderate: drinks hard liquor 7 drinks per month in 08/2011  . Drug Use: No  . Sexually Active: Not on file   Other Topics Concern  . Not on file   Social History Narrative   Married x 22 years, happily, second marriage. 1 son 69 yo. Exercise: Light, walking, 3 times a week. Caffeine use: carbonated beverages; consumes a minimal amount. Always uses seat belts, Guns in the home stored in locked cabinet. Smoke Alarms in the home.    Family History  Problem Relation Age of Onset  . Coronary artery disease Father   . Hypertension Father   . Cancer Mother   . Hypertension Brother     Objective:   Physical Exam  Nursing note and vitals reviewed. Constitutional: He appears well-developed and well-nourished. No distress.  HENT:  Head: Normocephalic and atraumatic.  Right Ear: External ear normal.  Left Ear: External ear normal.  Nose: Nose normal.  Mouth/Throat: Oropharynx is clear and moist.  Eyes: Conjunctivae and EOM are normal. Pupils are equal, round, and reactive to light.  Neck: Normal range of motion. Neck supple. No tracheal deviation present. No thyromegaly present.  Cardiovascular: Normal rate, regular rhythm, normal heart sounds and intact distal pulses.  Exam reveals no gallop and no friction rub.   No murmur heard. Pulmonary/Chest: Effort normal and  breath sounds normal. He has no wheezes. He has no rales.  Abdominal: Soft. Bowel sounds are normal. He exhibits no distension. There is no tenderness. There is no rebound and no guarding.  Lymphadenopathy:    He has no cervical adenopathy.  Skin: Skin is warm and dry. No rash noted. He is not diaphoretic. No erythema. No pallor.  Well-healed first toenail.  Psychiatric: He has a normal mood and affect. His behavior is normal. Judgment and thought content normal.        Assessment & Plan:  Type II or unspecified type diabetes mellitus without mention of complication, not stated as uncontrolled - Plan: Hemoglobin A1c, Saxagliptin-Metformin (KOMBIGLYZE XR) 2.12-998 MG TB24, glipiZIDE (GLUCOTROL XL) 10 MG 24 hr tablet  Essential hypertension, benign - Plan: CBC with Differential, CK, Comprehensive metabolic panel, lisinopril (PRINIVIL,ZESTRIL) 40 MG tablet  Pure hypercholesterolemia - Plan: Lipid panel,  simvastatin (ZOCOR) 20 MG tablet  GERD (gastroesophageal reflux disease) - Plan: omeprazole (PRILOSEC) 20 MG capsule  Erectile dysfunction - Plan: tadalafil (CIALIS) 20 MG tablet  Post-nasal drip - Plan: ipratropium (ATROVENT) 0.03 % nasal spray   1.  DMII: moderately controlled; obtain labs; refills provided. 2.  HTN: controlled; obtain labs; refill provided. 3.  Hyperlipidemia: controlled; obtain labs; refill provided. 4.  GERD: new.  Rx for Omeprazole provided; decrease alcohol intake.  Dietary modification and weight loss encouraged. 5.  PND: persistent despite recent Zpack, Flonase and Zyrtec. Rx for Atrovent nasal spray provided. 6. ED: stable; refill of Cialis provided.  Meds ordered this encounter  Medications  . tadalafil (CIALIS) 20 MG tablet    Sig: Take 1 tablet (20 mg total) by mouth daily as needed.    Dispense:  10 tablet    Refill:  11  . simvastatin (ZOCOR) 20 MG tablet    Sig: Take 1 tablet (20 mg total) by mouth every other day.    Dispense:  30 tablet     Refill:  11  . Saxagliptin-Metformin (KOMBIGLYZE XR) 2.12-998 MG TB24    Sig: Take 2 tablets by mouth daily after supper. Two daily with evening meal.    Dispense:  60 tablet    Refill:  11  . lisinopril (PRINIVIL,ZESTRIL) 40 MG tablet    Sig: Take 1 tablet (40 mg total) by mouth daily.    Dispense:  30 tablet    Refill:  11  . glipiZIDE (GLUCOTROL XL) 10 MG 24 hr tablet    Sig: Take 1 tablet (10 mg total) by mouth daily.    Dispense:  30 tablet    Refill:  11  . omeprazole (PRILOSEC) 20 MG capsule    Sig: Take 1 capsule (20 mg total) by mouth daily.    Dispense:  30 capsule    Refill:  11  . ipratropium (ATROVENT) 0.03 % nasal spray    Sig: Place 2 sprays into the nose 2 (two) times daily.    Dispense:  30 mL    Refill:  5

## 2013-01-27 ENCOUNTER — Encounter: Payer: Self-pay | Admitting: Family Medicine

## 2013-05-01 ENCOUNTER — Ambulatory Visit (INDEPENDENT_AMBULATORY_CARE_PROVIDER_SITE_OTHER): Payer: 59 | Admitting: Family Medicine

## 2013-05-01 ENCOUNTER — Encounter: Payer: Self-pay | Admitting: Family Medicine

## 2013-05-01 VITALS — BP 126/66 | HR 83 | Temp 99.0°F | Resp 16 | Ht 69.0 in | Wt 243.2 lb

## 2013-05-01 DIAGNOSIS — Z23 Encounter for immunization: Secondary | ICD-10-CM

## 2013-05-01 DIAGNOSIS — E119 Type 2 diabetes mellitus without complications: Secondary | ICD-10-CM

## 2013-05-01 DIAGNOSIS — Z0000 Encounter for general adult medical examination without abnormal findings: Secondary | ICD-10-CM

## 2013-05-01 LAB — CBC WITH DIFFERENTIAL/PLATELET
HCT: 42.8 % (ref 39.0–52.0)
Hemoglobin: 15.2 g/dL (ref 13.0–17.0)
Lymphocytes Relative: 23 % (ref 12–46)
Lymphs Abs: 2.2 10*3/uL (ref 0.7–4.0)
MCV: 84.9 fL (ref 78.0–100.0)
Monocytes Absolute: 0.9 10*3/uL (ref 0.1–1.0)
Monocytes Relative: 9 % (ref 3–12)
Neutro Abs: 6.4 10*3/uL (ref 1.7–7.7)
Neutrophils Relative %: 66 % (ref 43–77)
RDW: 13.3 % (ref 11.5–15.5)

## 2013-05-01 LAB — POCT URINALYSIS DIPSTICK
Blood, UA: NEGATIVE
Glucose, UA: NEGATIVE
Spec Grav, UA: >=1.03
Urobilinogen, UA: 0.2
pH, UA: 5.5

## 2013-05-01 LAB — COMPREHENSIVE METABOLIC PANEL
AST: 19 U/L (ref 0–37)
Albumin: 4.7 g/dL (ref 3.5–5.2)
Alkaline Phosphatase: 44 U/L (ref 39–117)
CO2: 26 mEq/L (ref 19–32)
Calcium: 9.9 mg/dL (ref 8.4–10.5)
Glucose, Bld: 186 mg/dL — ABNORMAL HIGH (ref 70–99)
Total Bilirubin: 0.9 mg/dL (ref 0.3–1.2)
Total Protein: 7.2 g/dL (ref 6.0–8.3)

## 2013-05-01 LAB — LIPID PANEL
HDL: 33 mg/dL — ABNORMAL LOW (ref >39)
Total CHOL/HDL Ratio: 2.5 Ratio
VLDL: 14 mg/dL (ref 0–40)

## 2013-05-01 LAB — PSA: PSA: 0.8 ng/mL (ref <=4.00)

## 2013-05-01 NOTE — Progress Notes (Signed)
[address]   [place], [place]  [address]   [phone]  Subjective:    Patient ID: Joseph Collier, male    DOB: 1950-12-07, 62 y.o.   MRN: [id number]  HPI This 62 y.o. male presents for evaluation of CPE.   Last CPE- 1/13. Colonoscopy- 2009 Dr. Madilyn Fireman (no abnormalities).   Tetanus- 2013 pneumovax- 2009 Flu vaccine- had today Eye- hasn't been in 12 years, saw Dr. Shea Evans.  Agreeable to referral.  +glasses. Dentist- goes every 6 months. Surgery- no new in past year.  Lost 18 pounds in past three months by decreasing alcohol, fatty foods and increasing exercise. Doesn't check blood sugars or BP at home. Feels better with weight loss.  Heartburn relieved with daily Prilosec and weight loss. Cialis working ok. Married 24 years. Son teaching in [place] since July. Coming home for Christmas.  His work picking up.   Alcohol- sometimes a couple of beers per night, no whiskey.  Exercise- walking every day for 2 miles and building.   Review of Systems Occasional ringing in ears, not new. Sometimes feels "a little rush" for a few seconds when bending down. No headache No mouth sores, neck swelling,  No chest pain, no palpitations, no SOB, cough improved, some phlegm. Occasional neck pain, no shoulder pain. Fell off ladder. Left great toenail doing well after removal last year- still having problem with bill. Bowels ok, no constipation, no diarrhea, no frequent stomach pain, no fevers, no night sweats.  Occasional snoring. Getting up to void 1x night. Stream maybe a little weaker. Goes to bed earliest 9:30, maybe 2:30 am on weekends. Sleeps 6 hours regularly. Feels rested. Never had sleep study.  Emotionally, "so far, so good." No anger or anxiety.  No prostate CA in family. Mother lung CA, Father heart attack.     Past Medical History  Diagnosis Date  . Type II or unspecified type diabetes mellitus without mention of complication, not stated as uncontrolled   . Essential  hypertension, benign   . Other and unspecified hyperlipidemia   . Skin lesion 04/14/2007    melanoma  . Anemia, unspecified   . Obesity, unspecified   . Esophageal reflux   . Impotence of organic origin 04/14/2007  . Allergic rhinitis, cause unspecified   . Personal history of colonic polyps   . Essential hypertension, benign   . Cancer 08/17/2006    Melanoma    Past Surgical History  Procedure Laterality Date  . Appendectomy    . Hernia repair      age 83  . Melanoma of forearm resection  1992    Left        [name]  . Tonsillectomy and adenoidectomy    . Arthroscopy of knee  2008    Left Knee  . Colonoscopy  08/18/2007    normal. Madilyn Fireman.    Prior to Admission medications   Medication Sig Start Date End Date Taking? Authorizing Provider  aspirin EC 81 MG tablet Take 81 mg by mouth daily.   Yes Historical Provider, MD  cetirizine (ZYRTEC) 10 MG tablet Take 10 mg by mouth daily as needed.   Yes Historical Provider, MD  glipiZIDE (GLUCOTROL XL) 10 MG 24 hr tablet Take 1 tablet (10 mg total) by mouth daily. 01/24/13  Yes Ethelda Chick, MD  ipratropium (ATROVENT) 0.03 % nasal spray Place 2 sprays into the nose 2 (two) times daily. 01/24/13  Yes Ethelda Chick, MD  lisinopril (PRINIVIL,ZESTRIL) 40 MG tablet Take  1 tablet (40 mg total) by mouth daily. 01/24/13  Yes Ethelda Chick, MD  omeprazole (PRILOSEC) 20 MG capsule Take 1 capsule (20 mg total) by mouth daily. 01/24/13  Yes Ethelda Chick, MD  Saxagliptin-Metformin (KOMBIGLYZE XR) 2.12-998 MG TB24 Take 2 tablets by mouth daily after supper. Two daily with evening meal. 01/24/13  Yes Ethelda Chick, MD  simvastatin (ZOCOR) 20 MG tablet Take 1 tablet (20 mg total) by mouth every other day. 01/24/13  Yes Ethelda Chick, MD  tadalafil (CIALIS) 20 MG tablet Take 1 tablet (20 mg total) by mouth daily as needed. 01/24/13  Yes Ethelda Chick, MD  fluticasone (FLONASE) 50 MCG/ACT nasal spray Place 2 sprays into the nose daily. 09/05/12   Ethelda Chick, MD    Allergies  Allergen Reactions  . Penicillins Hives and Swelling  . Tetracyclines & Related Hives and Swelling    History   Social History  . Marital Status: Married    Spouse Name: Iona Hansen    Number of Children: 1  . Years of Education: 12   Occupational History  . outside sales     engineered woods since 2000  .     Social History Main Topics  . Smoking status: Former Smoker -- 1.00 packs/day for 25 years    Types: Cigarettes  . Smokeless tobacco: Not on file     Comment: quit 1991  . Alcohol Use: 2.4 oz/week    4 Cans of beer per week     Comment: moderate: drinks hard liquor 7 drinks per month in 08/2011/4 drinks 4 a week  . Drug Use: No  . Sexual Activity: Yes    Birth Control/ Protection: Post-menopausal   Other Topics Concern  . Not on file   Social History Narrative   Marital status:  Married x 22 years, happily, second marriage.       Children:  1 son 74 yo. 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      Lives with wife.      Employment:  Airline pilot.       Exercise: Light, walking, 3 times a week.       Caffeine use: carbonated beverages; consumes a minimal amount.        Always uses seat belts,       Guns in the home stored in locked cabinet.       Smoke Alarms in the home.        Education: [employer].    Family History  Problem Relation Age of Onset  . Coronary artery disease Father   . Hypertension Father   . Heart disease Father   . Cancer Mother 38    lung  . Hypertension Brother     Objective:   Physical Exam  Nursing note and vitals reviewed. Constitutional: He is oriented to person, place, and time. He appears well-developed and well-nourished. No distress.  HENT:  Head: Normocephalic and atraumatic.  Right Ear: External ear normal.  Left Ear: External ear normal.  Nose: Nose normal.  Mouth/Throat: Oropharynx is clear and moist.  Eyes: Conjunctivae and EOM are normal. Pupils are equal, round, and reactive to light.   Neck: Normal range of motion. Neck supple. No JVD present. Carotid bruit is not present. No thyromegaly present.  Cardiovascular: Normal rate, regular rhythm, normal heart sounds and intact distal pulses.  Exam reveals no gallop and no friction rub.   No murmur heard. Pulmonary/Chest: Effort normal and  breath sounds normal. He has no wheezes. He has no rales.  Abdominal: Soft. Bowel sounds are normal. He exhibits no distension and no mass. There is no tenderness. There is no rebound and no guarding. Hernia confirmed negative in the right inguinal area and confirmed negative in the left inguinal area.  Genitourinary: Rectum normal, prostate normal, testes normal and penis normal. Right testis shows no mass, no swelling and no tenderness. Left testis shows no mass, no swelling and no tenderness. Circumcised.  Musculoskeletal: Normal range of motion.  Lymphadenopathy:    He has no cervical adenopathy.       Right: No inguinal adenopathy present.       Left: No inguinal adenopathy present.  Neurological: He is alert and oriented to person, place, and time. He has normal reflexes. No cranial nerve deficit. He exhibits normal muscle tone. Coordination normal.  Skin: Skin is warm and dry. No rash noted. He is not diaphoretic. No erythema. No pallor.  Psychiatric: He has a normal mood and affect. His behavior is normal. Judgment and thought content normal.   INFLUENZA VACCINE ADMINISTERED IN OFFICE.  EKG: NSR; NO ST CHANGES.  Results for orders placed in visit on 05/01/13  POCT URINALYSIS DIPSTICK      Result Value Range   Color, UA yellow     Clarity, UA clear     Glucose, UA neg     Bilirubin, UA neg     Ketones, UA 40     Spec Grav, UA >=1.030     Blood, UA neg     pH, UA 5.5     Protein, UA neg     Urobilinogen, UA 0.2     Nitrite, UA neg     Leukocytes, UA Negative         Assessment & Plan:  Need for prophylactic vaccination and inoculation against influenza - Plan: Flu Vaccine  QUAD 36+ mos IM, Microalbumin, urine, Vitamin B12, Vit D  25 hydroxy (rtn osteoporosis monitoring), CBC with Differential, CK, Comprehensive metabolic panel, Hemoglobin A1c, Lipid panel, PSA, TSH, EKG 12-Lead, POCT urinalysis dipstick  Routine general medical examination at a health care facility  Type II or unspecified type diabetes mellitus without mention of complication, not stated as uncontrolled - Plan: Ambulatory referral to Ophthalmology  1.  CPE:  Anticipatory guidance --- further weight loss, exercise, alcohol limitation.  Colonoscopy UTD.  Immunizations UTD: recommend contacting insurance regarding Zostavax coverage.  Obtain labs.  2.  S/p flu vaccine. 3.  DMII: worsening; has lost 18 pounds in past three months.  Refer for diabetic eye exam; educated on importance of yearly eye exam.

## 2013-05-01 NOTE — Patient Instructions (Signed)
1.  Call insurance regarding shingles vaccine (zostavax) coverage.

## 2013-05-01 NOTE — Progress Notes (Signed)
  Subjective:    Patient ID: Joseph Collier, male    DOB: [birth year], 62 y.o.   MRN: [id number]  HPI    Review of Systems  Constitutional: Negative.   HENT: Positive for tinnitus.   Eyes: Negative.   Respiratory: Negative.   Cardiovascular: Negative.   Gastrointestinal: Negative.   Endocrine: Negative.   Genitourinary: Negative.   Musculoskeletal: Negative.   Skin: Negative.   Allergic/Immunologic: Negative.   Neurological: Positive for dizziness.  Hematological: Negative.   Psychiatric/Behavioral: Negative.        Objective:   Physical Exam        Assessment & Plan:

## 2013-05-02 LAB — VITAMIN B12: Vitamin B-12: 524 pg/mL (ref 211–911)

## 2013-05-02 LAB — VITAMIN D 25 HYDROXY (VIT D DEFICIENCY, FRACTURES): Vit D, 25-Hydroxy: 50 ng/mL (ref 30–89)

## 2013-05-04 ENCOUNTER — Encounter: Payer: Self-pay | Admitting: Family Medicine

## 2013-07-31 ENCOUNTER — Ambulatory Visit (INDEPENDENT_AMBULATORY_CARE_PROVIDER_SITE_OTHER): Payer: 59 | Admitting: Family Medicine

## 2013-07-31 ENCOUNTER — Encounter: Payer: Self-pay | Admitting: Family Medicine

## 2013-07-31 ENCOUNTER — Ambulatory Visit: Payer: 59

## 2013-07-31 VITALS — BP 122/68 | HR 80 | Temp 98.3°F | Resp 16 | Ht 69.5 in | Wt 235.8 lb

## 2013-07-31 DIAGNOSIS — E7800 Pure hypercholesterolemia, unspecified: Secondary | ICD-10-CM

## 2013-07-31 DIAGNOSIS — M542 Cervicalgia: Secondary | ICD-10-CM

## 2013-07-31 DIAGNOSIS — E119 Type 2 diabetes mellitus without complications: Secondary | ICD-10-CM

## 2013-07-31 DIAGNOSIS — Z23 Encounter for immunization: Secondary | ICD-10-CM

## 2013-07-31 DIAGNOSIS — I10 Essential (primary) hypertension: Secondary | ICD-10-CM

## 2013-07-31 LAB — LIPID PANEL
Cholesterol: 105 mg/dL (ref 0–200)
HDL: 35 mg/dL — ABNORMAL LOW (ref >39)
LDL Cholesterol: 56 mg/dL (ref 0–99)
Total CHOL/HDL Ratio: 3 Ratio
Triglycerides: 68 mg/dL (ref <150)
VLDL: 14 mg/dL (ref 0–40)

## 2013-07-31 LAB — CBC WITH DIFFERENTIAL/PLATELET
Eosinophils Absolute: 0.2 10*3/uL (ref 0.0–0.7)
Eosinophils Relative: 2 % (ref 0–5)
Hemoglobin: 14.2 g/dL (ref 13.0–17.0)
Lymphs Abs: 2.3 10*3/uL (ref 0.7–4.0)
MCH: 29.6 pg (ref 26.0–34.0)
MCV: 85.2 fL (ref 78.0–100.0)
Monocytes Relative: 11 % (ref 3–12)
Platelets: 307 10*3/uL (ref 150–400)
RBC: 4.79 MIL/uL (ref 4.22–5.81)

## 2013-07-31 LAB — COMPLETE METABOLIC PANEL WITH GFR
BUN: 14 mg/dL (ref 6–23)
CO2: 27 mEq/L (ref 19–32)
Creat: 0.96 mg/dL (ref 0.50–1.35)
GFR, Est African American: >89 mL/min
GFR, Est Non African American: 86 mL/min
Glucose, Bld: 127 mg/dL — ABNORMAL HIGH (ref 70–99)
Total Bilirubin: 0.8 mg/dL (ref 0.3–1.2)

## 2013-07-31 LAB — HEMOGLOBIN A1C: Mean Plasma Glucose: 131 mg/dL — ABNORMAL HIGH (ref <117)

## 2013-07-31 MED ORDER — ZOSTER VACCINE LIVE 19400 UNT/0.65ML ~~LOC~~ SOLR: 0.6500 mL | Freq: Once | SUBCUTANEOUS | Status: DC

## 2013-07-31 MED ORDER — MELOXICAM 15 MG PO TABS: 15.0000 mg | ORAL_TABLET | Freq: Every day | ORAL | Status: DC

## 2013-07-31 MED ORDER — CYCLOBENZAPRINE HCL 5 MG PO TABS: 5.0000 mg | ORAL_TABLET | Freq: Every day | ORAL | Status: DC

## 2013-07-31 NOTE — Patient Instructions (Signed)
1. Call insurance regarding shingles vaccine coverage; if your insurance covers the shingles vaccine, you can take prescription to local pharmacy to receive vaccine.

## 2013-07-31 NOTE — Progress Notes (Signed)
Subjective:    Patient ID: Joseph Collier, male    DOB: [birth year], 62 y.o.   MRN: [id number]  HPI This 62 y.o. male presents for three month follow-up:  1.  DMII:  No changes to management at last visit; not checking sugars; HgbA1c still elevated; recommended continued weight loss; if HgbA1c still elevated at this visit, recommend adding insulin.  S/p eye exam; no daibetic retinopathy.  Dr. London Sheer.  Not eating big cheeseburgers anymore; eating English muffin and water for breakfast; fruit for lunch (banana, apple, orange); normal supper at home.  Water or diet Dr. Reino Kent.  Drinking no whiskey; 6-8 beers per week.    2. HTN:  Three month follow-up; no changes to management made at last visit; reports compliance with medication; good tolerance to medication; good symptom control. Denies CP/palp/SOB/leg swelling.  3. Hyperlipidemia:  Three month follow-up; no changes to management made at last visit; reports good tolerance to medication, good compliance with medication; good symptom control.  Denies HA/vision changes/new paresthesias/focal weakness.  4.  Health Maintenance: s/p flu vaccine 04/2013 at CPE.  Did not check with insurance regarding Zostavax.  5. Cold: onset one week ago; now improving.  Tons of phlegm.  No sinus pressure now.    6.  Neck pain:  L sided; no radiation into arms; persistent L hand n/t.  No weakness.  Taking Ibuprofen twice daily; also using BenGay; applying heat to area.  Soreness.  7. GERD: well controlled on Omeprazole; denies n/v/d/c; denies abdominal pain; denies melena or bloody stools.    Review of Systems  Constitutional: Negative for fever, chills, diaphoresis and fatigue.  HENT: Positive for congestion, postnasal drip and rhinorrhea. Negative for ear pain, sinus pressure, sore throat, trouble swallowing and voice change.   Respiratory: Negative for cough, shortness of breath and wheezing.   Cardiovascular: Negative for chest pain, palpitations and leg  swelling.  Gastrointestinal: Negative for nausea, vomiting, abdominal pain and diarrhea.   Past Medical History  Diagnosis Date  . Type II or unspecified type diabetes mellitus without mention of complication, not stated as uncontrolled   . Essential hypertension, benign   . Other and unspecified hyperlipidemia   . Skin lesion 04/14/2007    melanoma  . Anemia, unspecified   . Obesity, unspecified   . Esophageal reflux   . Impotence of organic origin 04/14/2007  . Allergic rhinitis, cause unspecified   . Personal history of colonic polyps   . Essential hypertension, benign   . Cancer 08/17/2006    Melanoma   Allergies  Allergen Reactions  . Penicillins Hives and Swelling  . Tetracyclines & Related Hives and Swelling   Current Outpatient Prescriptions on File Prior to Visit  Medication Sig Dispense Refill  . aspirin EC 81 MG tablet Take 81 mg by mouth daily.      . cetirizine (ZYRTEC) 10 MG tablet Take 10 mg by mouth daily as needed.      Marland Kitchen glipiZIDE (GLUCOTROL XL) 10 MG 24 hr tablet Take 1 tablet (10 mg total) by mouth daily.  30 tablet  11  . ipratropium (ATROVENT) 0.03 % nasal spray Place 2 sprays into the nose 2 (two) times daily.  30 mL  5  . lisinopril (PRINIVIL,ZESTRIL) 40 MG tablet Take 1 tablet (40 mg total) by mouth daily.  30 tablet  11  . omeprazole (PRILOSEC) 20 MG capsule Take 1 capsule (20 mg total) by mouth daily.  30 capsule  11  . Saxagliptin-Metformin (KOMBIGLYZE XR)  2.12-998 MG TB24 Take 2 tablets by mouth daily after supper. Two daily with evening meal.  60 tablet  11  . simvastatin (ZOCOR) 20 MG tablet Take 1 tablet (20 mg total) by mouth every other day.  30 tablet  11  . tadalafil (CIALIS) 20 MG tablet Take 1 tablet (20 mg total) by mouth daily as needed.  10 tablet  11   No current facility-administered medications on file prior to visit.   History   Social History  . Marital Status: Married    Spouse Name: Iona Hansen    Number of Children: 1  . Years of  Education: 12   Occupational History  . outside sales     engineered woods since 2000  .     Social History Main Topics  . Smoking status: Former Smoker -- 1.00 packs/day for 25 years    Types: Cigarettes  . Smokeless tobacco: Not on file     Comment: quit 1991  . Alcohol Use: 2.4 oz/week    4 Cans of beer per week     Comment: moderate: drinks hard liquor 7 drinks per month in 08/2011/4 drinks 4 a week  . Drug Use: No  . Sexual Activity: Yes    Birth Control/ Protection: Post-menopausal   Other Topics Concern  . Not on file   Social History Narrative   Marital status:  Married x 22 years, happily, second marriage.       Children:  1 son 23 yo. 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      Lives with wife.      Employment:  Airline pilot.       Exercise: Light, walking, 3 times a week.       Caffeine use: carbonated beverages; consumes a minimal amount.        Always uses seat belts,       Guns in the home stored in locked cabinet.       Smoke Alarms in the home.        Education: [employer].       Objective:   Physical Exam  Nursing note and vitals reviewed. Constitutional: He is oriented to person, place, and time. He appears well-developed and well-nourished. No distress.  HENT:  Head: Normocephalic and atraumatic.  Right Ear: External ear normal.  Left Ear: External ear normal.  Nose: Nose normal.  Mouth/Throat: Oropharynx is clear and moist.  Eyes: Conjunctivae and EOM are normal. Pupils are equal, round, and reactive to light.  Neck: Normal range of motion. Neck supple. Muscular tenderness present. No spinous process tenderness present. Carotid bruit is not present. No rigidity. No edema, no erythema and normal range of motion present. No mass and no thyromegaly present.  Cardiovascular: Normal rate, regular rhythm, normal heart sounds and intact distal pulses.   No murmur heard. Pulmonary/Chest: Effort normal and breath sounds normal. He has no wheezes. He has  no rales.  Abdominal: Soft. Bowel sounds are normal. He exhibits no distension and no mass. There is no tenderness. There is no rebound and no guarding.  Musculoskeletal:       Cervical back: He exhibits tenderness and pain. He exhibits normal range of motion, no bony tenderness, no swelling and no spasm.  +TTP L paraspinal region.  Pain with rotation side to side and lateral bending; no pain with flexion/extension; motor 5/5 BUE. Grip 5/5.  Lymphadenopathy:    He has no cervical adenopathy.  Neurological: He is alert and oriented  to person, place, and time.  Skin: Skin is warm and dry. No rash noted. He is not diaphoretic.  Psychiatric: He has a normal mood and affect. His behavior is normal.   [facility] reading (PRIMARY) by  Dr. Katrinka Blazing. C-SPINE:  Degenerative changes present C5-6.      Assessment & Plan:  Type II or unspecified type diabetes mellitus without mention of complication, not stated as uncontrolled - Plan: HM Diabetes Foot Exam, COMPLETE METABOLIC PANEL WITH GFR, Hemoglobin A1c  Pure hypercholesterolemia - Plan: Lipid panel  Essential hypertension, benign - Plan: CBC with Differential, COMPLETE METABOLIC PANEL WITH GFR  Need for Zostavax administration - Plan: zoster vaccine live, PF, (ZOSTAVAX) 45409 UNT/0.65ML injection  Neck pain on left side - Plan: DG Cervical Spine Complete  1. DMII: uncontrolled yet has further weight loss since last visit; repeat labs; patient very reluctant to start insulin therapy.  S/p eye exam since last visit; no diabetic retinopathy. 2.  HTN: controlled; obtain labs; no change in management. 3.  Hyperlipidemia: controlled; no change in management; obtain labs. 4.  Neck pain/strain:  New.  Rx for Meloxicam and Flexeril provided; continue heat to area qhs for fifteen minutes; home exercise program reviewed and recommended to perform daily.   5.  Zostavax rx provided. 6. GERD: controlled on current medication.  Meds ordered this encounter    Medications  . PRESCRIPTION MEDICATION    Sig: Clindamycin 150 mg taking 4 tabs for dental work.  . zoster vaccine live, PF, (ZOSTAVAX) 81191 UNT/0.65ML injection    Sig: Inject 19,400 Units into the skin once.    Dispense:  1 each    Refill:  0  . meloxicam (MOBIC) 15 MG tablet    Sig: Take 1 tablet (15 mg total) by mouth daily.    Dispense:  30 tablet    Refill:  0  . cyclobenzaprine (FLEXERIL) 5 MG tablet    Sig: Take 1 tablet (5 mg total) by mouth at bedtime.    Dispense:  30 tablet    Refill:  0

## 2013-08-13 ENCOUNTER — Encounter: Payer: Self-pay | Admitting: Family Medicine

## 2013-08-24 ENCOUNTER — Other Ambulatory Visit: Payer: Self-pay | Admitting: Family Medicine

## 2013-08-25 NOTE — Telephone Encounter (Signed)
Spoke to pt. He states the problem has resolved while taking these two medications. He is going to stop taking them and see if the neck pain persists. He will RTC or contact us if the pain returns. He does not want the Rx refilled at this time.

## 2013-10-30 ENCOUNTER — Ambulatory Visit (INDEPENDENT_AMBULATORY_CARE_PROVIDER_SITE_OTHER): Payer: 59 | Admitting: Family Medicine

## 2013-10-30 ENCOUNTER — Encounter: Payer: Self-pay | Admitting: Family Medicine

## 2013-10-30 VITALS — BP 116/62 | HR 76 | Temp 98.2°F | Resp 16 | Ht 69.0 in | Wt 234.0 lb

## 2013-10-30 DIAGNOSIS — M542 Cervicalgia: Secondary | ICD-10-CM

## 2013-10-30 DIAGNOSIS — E119 Type 2 diabetes mellitus without complications: Secondary | ICD-10-CM

## 2013-10-30 DIAGNOSIS — I10 Essential (primary) hypertension: Secondary | ICD-10-CM

## 2013-10-30 DIAGNOSIS — E7800 Pure hypercholesterolemia, unspecified: Secondary | ICD-10-CM

## 2013-10-30 DIAGNOSIS — M5030 Other cervical disc degeneration, unspecified cervical region: Secondary | ICD-10-CM

## 2013-10-30 LAB — COMPLETE METABOLIC PANEL WITH GFR
ALT: 20 U/L (ref 0–53)
AST: 17 U/L (ref 0–37)
Albumin: 4.6 g/dL (ref 3.5–5.2)
Alkaline Phosphatase: 40 U/L (ref 39–117)
BUN: 15 mg/dL (ref 6–23)
CALCIUM: 9.7 mg/dL (ref 8.4–10.5)
CO2: 29 meq/L (ref 19–32)
Chloride: 103 mEq/L (ref 96–112)
Creat: 0.89 mg/dL (ref 0.50–1.35)
GFR, Est Non African American: >89 mL/min
Glucose, Bld: 140 mg/dL — ABNORMAL HIGH (ref 70–99)
Potassium: 4.9 mEq/L (ref 3.5–5.3)
SODIUM: 140 meq/L (ref 135–145)
TOTAL PROTEIN: 6.7 g/dL (ref 6.0–8.3)
Total Bilirubin: 0.5 mg/dL (ref 0.2–1.2)

## 2013-10-30 LAB — LIPID PANEL
Cholesterol: 115 mg/dL (ref 0–200)
HDL: 40 mg/dL (ref >39)
LDL CALC: 63 mg/dL (ref 0–99)
TRIGLYCERIDES: 58 mg/dL (ref <150)
Total CHOL/HDL Ratio: 2.9 Ratio
VLDL: 12 mg/dL (ref 0–40)

## 2013-10-30 LAB — POCT GLYCOSYLATED HEMOGLOBIN (HGB A1C): HEMOGLOBIN A1C: 6

## 2013-10-30 LAB — CBC WITH DIFFERENTIAL/PLATELET
BASOS ABS: 0 10*3/uL (ref 0.0–0.1)
Basophils Relative: 0 % (ref 0–1)
Eosinophils Absolute: 0.2 10*3/uL (ref 0.0–0.7)
Eosinophils Relative: 2 % (ref 0–5)
HCT: 42.2 % (ref 39.0–52.0)
Hemoglobin: 14.6 g/dL (ref 13.0–17.0)
Lymphocytes Relative: 27 % (ref 12–46)
Lymphs Abs: 2 10*3/uL (ref 0.7–4.0)
MCH: 29.7 pg (ref 26.0–34.0)
MCHC: 34.6 g/dL (ref 30.0–36.0)
MCV: 85.8 fL (ref 78.0–100.0)
Monocytes Absolute: 0.7 10*3/uL (ref 0.1–1.0)
Monocytes Relative: 9 % (ref 3–12)
NEUTROS ABS: 4.7 10*3/uL (ref 1.7–7.7)
NEUTROS PCT: 62 % (ref 43–77)
Platelets: 292 10*3/uL (ref 150–400)
RBC: 4.92 MIL/uL (ref 4.22–5.81)
RDW: 13.4 % (ref 11.5–15.5)
WBC: 7.5 10*3/uL (ref 4.0–10.5)

## 2013-10-30 MED ORDER — PREDNISONE 20 MG PO TABS: ORAL_TABLET | ORAL | Status: DC

## 2013-10-30 MED ORDER — GLIPIZIDE ER 5 MG PO TB24: 5.0000 mg | ORAL_TABLET | Freq: Every day | ORAL | Status: DC

## 2013-10-30 NOTE — Progress Notes (Signed)
Subjective:   Patient ID: Joseph Collier, male    DOB: [birth year], 63 y.o.   MRN: [id number]  HPI  This chart was scribed for [facility]. Joseph Julian, MD, by Joseph Collier, ED Scribe. This patient was seen in room 21 and the patient's care was started at 9:47 AM.  HPI Comments: Joseph Collier is a 63 y.o. male who presents to the [facility] for a three month follow-up appointment. His last appointment was a three-month follow-up for DM-II that occurred on 07/31/2013. During that time, no changes were made to DM-II management and patient was recommended to continue weight loss. Additionally, no changes were made to patient's HTN, cholesterol, or hyperlipidemia management as patient reported good compliance with treatment plan and no complications to medications or concerning symptoms. Additionally, during this previous visit, patient reported neck pain and was prescribed meloxicam 15mg  and cyclobenzaprine 5mg . Patient was also prescribed a zoster vaccine for shingles.  Patient reports today with some planned weight loss; he has lost one additional pound in the past three months. He reports his HTN is under control and HbgA1c today recorded as 6.0 today. Patient reports he is checking his blood glucose 3-4 times per day, with an average reading of 100 in the mornings. He reports that 3-4 times/ month, he feels weak during the afternoon and reads a low blood glucose recording around 67. He states this usually occurs after working outside or with exertion, or when he accidentally skips a meal. He states that he records his BP 1/month. He denies any CP, palpitations, SOB or prolonged coughing.  Patient reports that his neck pain is still present. He states that with meloxicam and flexeril, his pain improved; however, when he finished the prescription, his pain returned and has been constant and non changing. He states that there is no radiation, but that there is a numbness in his upper  extremities with associated paresthesias that occurs a few times per month. He states that x-ray findings indicated a possible arthritic condition. Patient denies any weakness in the extremities.   He denies any other med conditions. He walks every day and states he has reduced his walking regimen slightly. Patient reports that he is planning on walking 3-4 miles per day when the weather warms. Patient states he takes zyrtec for seasonal allergies. Patient states his insurance will not cover the zoster vaccination.   Past Medical History  Diagnosis Date  . Type II or unspecified type diabetes mellitus without mention of complication, not stated as uncontrolled   . Essential hypertension, benign   . Other and unspecified hyperlipidemia   . Skin lesion 04/14/2007    melanoma  . Anemia, unspecified   . Obesity, unspecified   . Esophageal reflux   . Impotence of organic origin 04/14/2007  . Allergic rhinitis, cause unspecified   . Personal history of colonic polyps   . Essential hypertension, benign   . Cancer 08/17/2006    Melanoma    Past Surgical History  Procedure Laterality Date  . Appendectomy    . Hernia repair      age 39  . Melanoma of forearm resection  1992    Left        Joseph Collier  . Tonsillectomy and adenoidectomy    . Arthroscopy of knee  2008    Left Knee  . Colonoscopy  08/18/2007    normal. Joseph Collier.    Family History  Problem Relation Age of Onset  . Coronary  artery disease Father   . Hypertension Father   . Heart disease Father   . Cancer Mother 56    lung  . Hypertension Brother     History   Social History  . Marital Status: Married    Spouse Name: Joseph Collier    Number of Children: 1  . Years of Education: 12   Occupational History  . outside sales     engineered woods since 2000  .     Social History Main Topics  . Smoking status: Former Smoker -- 1.00 packs/day for 25 years    Types: Cigarettes  . Smokeless tobacco: Not on file     Comment: quit 1991    . Alcohol Use: 2.4 oz/week    4 Cans of beer per week     Comment: moderate: drinks hard liquor 7 drinks per month in 08/2011/4 drinks 4 a week  . Drug Use: No  . Sexual Activity: Yes    Birth Control/ Protection: Post-menopausal   Other Topics Concern  . Not on file   Social History Narrative   Marital status:  Married x 22 years, happily, second marriage.       Children:  1 son 57 yo. 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      Lives with wife.      Employment:  Press photographer.       Exercise: Light, walking, 3 times a week.       Caffeine use: carbonated beverages; consumes a minimal amount.        Always uses seat belts,       Guns in the home stored in locked cabinet.       Smoke Alarms in the home.        Education: [employer].    Allergies  Allergen Reactions  . Penicillins Hives and Swelling  . Tetracyclines & Related Hives and Swelling    Patient Active Problem List   Diagnosis Date Noted  . Type II or unspecified type diabetes mellitus without mention of complication, not stated as uncontrolled 09/05/2012  . Pure hypercholesterolemia 09/05/2012  . Essential hypertension, benign 09/05/2012  . Pain in toe of left foot 09/05/2012  . Ingrown toenail 09/05/2012  . Cough 09/05/2012      Current Outpatient Prescriptions on File Prior to Visit  Medication Sig Dispense Refill  . aspirin EC 81 MG tablet Take 81 mg by mouth daily.      . cetirizine (ZYRTEC) 10 MG tablet Take 10 mg by mouth daily as needed.      Marland Kitchen ipratropium (ATROVENT) 0.03 % nasal spray Place 2 sprays into the nose 2 (two) times daily.  30 mL  5  . lisinopril (PRINIVIL,ZESTRIL) 40 MG tablet Take 1 tablet (40 mg total) by mouth daily.  30 tablet  11  . omeprazole (PRILOSEC) 20 MG capsule Take 1 capsule (20 mg total) by mouth daily.  30 capsule  11  . PRESCRIPTION MEDICATION Clindamycin 150 mg taking 4 tabs for dental work.      . Saxagliptin-Metformin (KOMBIGLYZE XR) 2.12-998 MG TB24 Take 2 tablets  by mouth daily after supper. Two daily with evening meal.  60 tablet  11  . simvastatin (ZOCOR) 20 MG tablet Take 1 tablet (20 mg total) by mouth every other day.  30 tablet  11  . tadalafil (CIALIS) 20 MG tablet Take 1 tablet (20 mg total) by mouth daily as needed.  10 tablet  11  . cyclobenzaprine (FLEXERIL)  5 MG tablet Take 1 tablet (5 mg total) by mouth at bedtime.  30 tablet  0  . meloxicam (MOBIC) 15 MG tablet Take 1 tablet (15 mg total) by mouth daily.  30 tablet  0  . zoster vaccine live, PF, (ZOSTAVAX) 16109 UNT/0.65ML injection Inject 19,400 Units into the skin once.  1 each  0   No current facility-administered medications on file prior to visit.   Triage Vitals: BP 116/62  Pulse 76  Temp(Src) 98.2 F (36.8 C) (Oral)  Resp 16  Ht 5\' 9"  (1.753 m)  Wt 234 lb (106.142 kg)  BMI 34.54 kg/m2  SpO2 97%  Review of Systems  Constitutional: Negative for fever and chills.  HENT: Positive for congestion and sneezing.   Eyes: Negative for visual disturbance.  Respiratory: Negative for cough and shortness of breath.   Cardiovascular: Negative for chest pain, palpitations and leg swelling.  Gastrointestinal: Negative for nausea, vomiting, abdominal pain, diarrhea and constipation.  Endocrine: Negative for cold intolerance, heat intolerance, polydipsia, polyphagia and polyuria.  Musculoskeletal: Positive for neck pain.  Skin: Negative for rash.  Neurological: Positive for numbness. Negative for dizziness, facial asymmetry, weakness and headaches.  All other systems reviewed and are negative.   Objective:  Physical Exam  Nursing note and vitals reviewed. Constitutional: He is oriented to person, place, and time. He appears well-developed and well-nourished. No distress.  HENT:  Head: Normocephalic and atraumatic.  Right Ear: External ear normal.  Left Ear: External ear normal.  Nose: Nose normal.  Mouth/Throat: Oropharynx is clear and moist.  Eyes: Conjunctivae and EOM are  normal. Pupils are equal, round, and reactive to light.  Neck: Full passive range of motion without pain. Neck supple. Muscular tenderness present. No spinous process tenderness present. No tracheal deviation and normal range of motion present. No thyromegaly present.  Cardiovascular: Normal rate, regular rhythm, normal heart sounds and intact distal pulses.  Exam reveals no gallop and no friction rub.   No murmur heard. Pulmonary/Chest: Effort normal and breath sounds normal. No respiratory distress. He has no wheezes. He has no rales.  Abdominal: Soft. Bowel sounds are normal. He exhibits no distension. There is no tenderness. There is no rebound.  Musculoskeletal: Normal range of motion. He exhibits tenderness.       Right shoulder: Normal.       Left shoulder: Normal.       Cervical back: He exhibits tenderness and pain. He exhibits normal range of motion and no spasm.  TTP to L trapezius region. No midline tenderness of cervical spine. FROM cervical spine  Lymphadenopathy:    He has no cervical adenopathy.  Neurological: He is alert and oriented to person, place, and time. No cranial nerve deficit. He exhibits normal muscle tone. Coordination normal.  Skin: Skin is warm and dry. No rash noted. He is not diaphoretic.  L first toe mild swelling and erythema along nail bed. Mild callous formation along heel region, bilaterally.   Psychiatric: He has a normal mood and affect. His behavior is normal.    Assessment & Plan:  Type II or unspecified type diabetes mellitus without mention of complication, not stated as uncontrolled - Plan: POCT glycosylated hemoglobin (Hb A1C), HM Diabetes Foot Exam, glipiZIDE (GLUCOTROL XL) 5 MG 24 hr tablet  Pure hypercholesterolemia - Plan: Lipid panel  Essential hypertension, benign - Plan: COMPLETE METABOLIC PANEL WITH GFR, CBC with Differential  Neck pain on left side  Degenerative disc disease, cervical   1. DMII: controlled  with HgbA1c of 6.0;  decrease Glipizide to 5mg  daily to avoid hypoglycemia.  Obtain labs. 2.  HTN: controlled; obtain labs; continue current medications. 3.  Hyperlipidemia: controlled; obtain labs; continue current medications. 4.  DDD cervical spine with persistent pain:  Unchanged; now with intermittent radiculopathy with paresthesias.  Pt declined referral to ortho for further management due to expense; will reevaluate at follow-up in three months.  Rx for Prednisone provided.   Meds ordered this encounter  Medications  . glipiZIDE (GLUCOTROL XL) 5 MG 24 hr tablet    Sig: Take 1 tablet (5 mg total) by mouth daily.    Dispense:  30 tablet    Refill:  11  . predniSONE (DELTASONE) 20 MG tablet    Sig: Three tablets daily x 1 day then two tablets daily x 5 days then one tablet daily x 5 days    Dispense:  18 tablet    Refill:  0    COORDINATION OF CARE: 9:52 AM-Discussed reducing medications (glipizide 10mg  to 5mg ) following positive lab results of HTN and HbgA1C. Discussed seeing a specialist for neck pain and paresthesias. Patient discussed financial limitations of seeking a specialist currently. Discussed trying Prednisone as an alternative treatment plan. Will F/U with patient in three months. Treatment plan discussed with patient and patient agrees.  I personally performed the services described in this documentation, which was scribed in my presence. The recorded information has been reviewed and is accurate.  Reginia Forts, M.D.  [facility] [address] [place], [place]  [address] [phone] phone [phone] fax

## 2013-11-03 ENCOUNTER — Encounter: Payer: Self-pay | Admitting: Family Medicine

## 2014-01-22 ENCOUNTER — Other Ambulatory Visit: Payer: Self-pay | Admitting: Family Medicine

## 2014-01-23 NOTE — Telephone Encounter (Signed)
Dr Tamala Julian, you saw pt in March for chronic issues but don't see this med discussed. Can we RF?

## 2014-01-25 ENCOUNTER — Other Ambulatory Visit: Payer: Self-pay | Admitting: Family Medicine

## 2014-02-05 ENCOUNTER — Ambulatory Visit (INDEPENDENT_AMBULATORY_CARE_PROVIDER_SITE_OTHER): Payer: 59 | Admitting: Family Medicine

## 2014-02-05 ENCOUNTER — Other Ambulatory Visit: Payer: Self-pay | Admitting: Family Medicine

## 2014-02-05 ENCOUNTER — Encounter: Payer: Self-pay | Admitting: Family Medicine

## 2014-02-05 VITALS — BP 113/68 | HR 86 | Temp 98.5°F | Resp 16 | Ht 69.0 in | Wt 231.4 lb

## 2014-02-05 DIAGNOSIS — N529 Male erectile dysfunction, unspecified: Secondary | ICD-10-CM

## 2014-02-05 DIAGNOSIS — K219 Gastro-esophageal reflux disease without esophagitis: Secondary | ICD-10-CM

## 2014-02-05 DIAGNOSIS — E119 Type 2 diabetes mellitus without complications: Secondary | ICD-10-CM

## 2014-02-05 DIAGNOSIS — R0982 Postnasal drip: Secondary | ICD-10-CM

## 2014-02-05 DIAGNOSIS — I10 Essential (primary) hypertension: Secondary | ICD-10-CM

## 2014-02-05 DIAGNOSIS — L570 Actinic keratosis: Secondary | ICD-10-CM

## 2014-02-05 DIAGNOSIS — Z85820 Personal history of malignant melanoma of skin: Secondary | ICD-10-CM

## 2014-02-05 DIAGNOSIS — E7800 Pure hypercholesterolemia, unspecified: Secondary | ICD-10-CM

## 2014-02-05 DIAGNOSIS — M5030 Other cervical disc degeneration, unspecified cervical region: Secondary | ICD-10-CM

## 2014-02-05 LAB — CBC WITH DIFFERENTIAL/PLATELET
Basophils Absolute: 0 10*3/uL (ref 0.0–0.1)
Basophils Relative: 0 % (ref 0–1)
Eosinophils Absolute: 0.2 10*3/uL (ref 0.0–0.7)
Eosinophils Relative: 2 % (ref 0–5)
HCT: 43 % (ref 39.0–52.0)
Hemoglobin: 15.1 g/dL (ref 13.0–17.0)
LYMPHS ABS: 2 10*3/uL (ref 0.7–4.0)
LYMPHS PCT: 22 % (ref 12–46)
MCH: 30 pg (ref 26.0–34.0)
MCHC: 35.1 g/dL (ref 30.0–36.0)
MCV: 85.5 fL (ref 78.0–100.0)
Monocytes Absolute: 0.8 10*3/uL (ref 0.1–1.0)
Monocytes Relative: 9 % (ref 3–12)
NEUTROS ABS: 6.1 10*3/uL (ref 1.7–7.7)
NEUTROS PCT: 67 % (ref 43–77)
PLATELETS: 314 10*3/uL (ref 150–400)
RBC: 5.03 MIL/uL (ref 4.22–5.81)
RDW: 13.6 % (ref 11.5–15.5)
WBC: 9.1 10*3/uL (ref 4.0–10.5)

## 2014-02-05 LAB — COMPREHENSIVE METABOLIC PANEL
ALT: 17 U/L (ref 0–53)
AST: 16 U/L (ref 0–37)
Albumin: 4.7 g/dL (ref 3.5–5.2)
Alkaline Phosphatase: 38 U/L — ABNORMAL LOW (ref 39–117)
BUN: 16 mg/dL (ref 6–23)
CALCIUM: 9.7 mg/dL (ref 8.4–10.5)
CHLORIDE: 103 meq/L (ref 96–112)
CO2: 26 meq/L (ref 19–32)
CREATININE: 0.88 mg/dL (ref 0.50–1.35)
Glucose, Bld: 172 mg/dL — ABNORMAL HIGH (ref 70–99)
Potassium: 4.9 mEq/L (ref 3.5–5.3)
SODIUM: 139 meq/L (ref 135–145)
TOTAL PROTEIN: 7.1 g/dL (ref 6.0–8.3)
Total Bilirubin: 0.6 mg/dL (ref 0.2–1.2)

## 2014-02-05 LAB — LIPID PANEL
CHOL/HDL RATIO: 3.2 ratio
CHOLESTEROL: 140 mg/dL (ref 0–200)
HDL: 44 mg/dL (ref >39)
LDL Cholesterol: 80 mg/dL (ref 0–99)
Triglycerides: 80 mg/dL (ref <150)
VLDL: 16 mg/dL (ref 0–40)

## 2014-02-05 MED ORDER — OMEPRAZOLE 20 MG PO CPDR: 20.0000 mg | DELAYED_RELEASE_CAPSULE | Freq: Every day | ORAL | Status: DC

## 2014-02-05 MED ORDER — LISINOPRIL 40 MG PO TABS: 40.0000 mg | ORAL_TABLET | Freq: Every day | ORAL | Status: DC

## 2014-02-05 MED ORDER — GLIPIZIDE ER 5 MG PO TB24: 5.0000 mg | ORAL_TABLET | Freq: Every day | ORAL | Status: DC

## 2014-02-05 MED ORDER — SIMVASTATIN 20 MG PO TABS: 20.0000 mg | ORAL_TABLET | ORAL | Status: DC

## 2014-02-05 MED ORDER — IPRATROPIUM BROMIDE 0.03 % NA SOLN
2.0000 | Freq: Two times a day (BID) | NASAL | Status: DC
Start: 2014-02-05 — End: 2014-05-10

## 2014-02-05 MED ORDER — SAXAGLIPTIN-METFORMIN ER 2.5-1000 MG PO TB24: ORAL_TABLET | ORAL | Status: DC

## 2014-02-05 MED ORDER — TADALAFIL 20 MG PO TABS: 20.0000 mg | ORAL_TABLET | Freq: Every day | ORAL | Status: DC | PRN

## 2014-02-05 NOTE — Progress Notes (Signed)
Subjective:    Patient ID: Joseph Collier, male    DOB: 1951/02/09, 63 y.o.   MRN: [id number]  02/05/2014  Follow-up, Medication Refill, Hyperlipidemia, Hypertension and Diabetes   HPI This 63 y.o. male presents for three month follow-up of the following:  1. DMII: three month follow-up; management made at last visit included decreasing Glipizide ER to 31m daily; HgbA1c at last visit improved at 6.0.  Patient reports good compliance with medication, good tolerance to medication, and good symptom control.  Weight down 3 more pounds from last visit.  Was checking sugars at home but ran out of test strips. No low blood sugars.  Uses [facility].  Has never had rx.  Denies numbness or tingling in feet.  2. HTN: no changes to management made at last visit.  Patient reports good compliance with medication, good tolerance to medication, and good symptom control.  Does not check BP at home.  Denies CP/palp/SOB/leg swelling.  3. Hyperlipidemia: no changes to management made at last visit.  Patient reports poor compliance with medication, good tolerance to medication, and good symptom control.  Denies frequent HA/focal weakness/paresthesias.  Pharmacy did not refill Simvastatin at last visit.  4.  Neck pain with radiculopathy: rx for Prednisone provided at last visit.  Pt declined referral to ortho due to expense.  Prednisone helped while taking medication and then symptoms recurred.  Still not ready to see ortho.    5.  Facial skin lesion: R sided; last dermatology consultation two years ago.  [facility].  No bleeding; +crusty.  History of melanoma L forearm.  6.  Insurance does not cover Zostavax; did not get.    7. GERD: well controlled with PPI daily; denies n/v/d/c; denies bloody stools or melena.  Review of Systems  Constitutional: Negative for chills, diaphoresis, activity change, appetite change and fatigue.  Eyes: Negative for visual disturbance.  Respiratory: Negative for cough and  shortness of breath.   Cardiovascular: Negative for chest pain, palpitations and leg swelling.  Gastrointestinal: Negative for nausea, vomiting, abdominal pain, diarrhea, constipation and blood in stool.  Endocrine: Negative for cold intolerance, heat intolerance, polydipsia, polyphagia and polyuria.  Musculoskeletal: Positive for neck pain and neck stiffness.  Skin: Positive for color change and wound.  Neurological: Positive for numbness. Negative for dizziness, tremors, syncope, facial asymmetry, speech difficulty, weakness, light-headedness and headaches.    Past Medical History  Diagnosis Date  . Type II or unspecified type diabetes mellitus without mention of complication, not stated as uncontrolled   . Essential hypertension, benign   . Other and unspecified hyperlipidemia   . Skin lesion 04/14/2007    melanoma  . Anemia, unspecified   . Obesity, unspecified   . Esophageal reflux   . Impotence of organic origin 04/14/2007  . Allergic rhinitis, cause unspecified   . Personal history of colonic polyps   . Essential hypertension, benign   . Cancer 08/17/2006    Melanoma   Past Surgical History  Procedure Laterality Date  . Appendectomy    . Hernia repair      age 63 . Melanoma of forearm resection  1992    Left        [name]  . Tonsillectomy and adenoidectomy    . Arthroscopy of knee  2008    Left Knee  . Colonoscopy  08/18/2007    normal. HAmedeo Plenty    Allergies  Allergen Reactions  . Penicillins Hives and Swelling  . Tetracyclines & Related Hives and Swelling  Current Outpatient Prescriptions  Medication Sig Dispense Refill  . aspirin EC 81 MG tablet Take 81 mg by mouth daily.      Marland Kitchen glipiZIDE (GLUCOTROL XL) 5 MG 24 hr tablet Take 1 tablet (5 mg total) by mouth daily.  30 tablet  11  . ipratropium (ATROVENT) 0.03 % nasal spray Place 2 sprays into the nose 2 (two) times daily.  30 mL  5  . lisinopril (PRINIVIL,ZESTRIL) 40 MG tablet Take 1 tablet (40 mg total) by mouth  daily.  30 tablet  11  . omeprazole (PRILOSEC) 20 MG capsule TAKE 1 CAPSULE (20 MG TOTAL) BY MOUTH DAILY.  30 capsule  11  . Saxagliptin-Metformin (KOMBIGLYZE XR) 2.12-998 MG TB24 Take 2 tablets daily after evening meal.  60 tablet  5  . simvastatin (ZOCOR) 20 MG tablet Take 1 tablet (20 mg total) by mouth every other day.  30 tablet  11  . tadalafil (CIALIS) 20 MG tablet Take 1 tablet (20 mg total) by mouth daily as needed.  10 tablet  11  . cetirizine (ZYRTEC) 10 MG tablet Take 10 mg by mouth daily as needed.      Marland Kitchen PRESCRIPTION MEDICATION Clindamycin 150 mg taking 4 tabs for dental work.      . zoster vaccine live, PF, (ZOSTAVAX) 75102 UNT/0.65ML injection Inject 19,400 Units into the skin once.  1 each  0   No current facility-administered medications for this visit.   History   Social History  . Marital Status: Married    Spouse Name: [name]    Number of Children: 1  . Years of Education: 12   Occupational History  . outside sales     engineered woods since 2000  .     Social History Main Topics  . Smoking status: Former Smoker -- 1.00 packs/day for 25 years    Types: Cigarettes  . Smokeless tobacco: Not on file     Comment: quit 1991  . Alcohol Use: 2.4 oz/week    4 Cans of beer per week     Comment: moderate: drinks hard liquor 7 drinks per month in 08/2011/4 drinks 4 a week  . Drug Use: No  . Sexual Activity: Yes    Birth Control/ Protection: Post-menopausal   Other Topics Concern  . Not on file   Social History Narrative   Marital status:  Married x 22 years, happily, second marriage.       Children:  1 son 28 yo. 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      Lives with wife.      Employment:  Press photographer.       Exercise: Light, walking, 3 times a week.       Caffeine use: carbonated beverages; consumes a minimal amount.        Always uses seat belts,       Guns in the home stored in locked cabinet.       Smoke Alarms in the home.        Education: [employer].       Objective:    BP 113/68  Pulse 86  Temp(Src) 98.5 F (36.9 C) (Oral)  Resp 16  Ht _0  (1.753 m)  Wt 231 lb 6.4 oz (104.962 kg)  BMI 34.16 kg/m2  SpO2 100% Physical Exam  Nursing note and vitals reviewed. Constitutional: He is oriented to person, place, and time. He appears well-developed and well-nourished. No distress.  HENT:  Head: Normocephalic and  atraumatic.  Right Ear: External ear normal.  Left Ear: External ear normal.  Nose: Nose normal.  Mouth/Throat: Oropharynx is clear and moist.  Eyes: Conjunctivae and EOM are normal. Pupils are equal, round, and reactive to light.  Neck: Normal range of motion. Neck supple. Carotid bruit is not present. No thyromegaly present.  Cardiovascular: Normal rate, regular rhythm, normal heart sounds and intact distal pulses.  Exam reveals no gallop and no friction rub.   No murmur heard. No LE edema.  Pulmonary/Chest: Effort normal and breath sounds normal. He has no wheezes. He has no rales.  Abdominal: Soft. Bowel sounds are normal. He exhibits no distension and no mass. There is no tenderness. There is no rebound and no guarding.  Lymphadenopathy:    He has no cervical adenopathy.  Neurological: He is alert and oriented to person, place, and time. No cranial nerve deficit.  Skin: Skin is warm and dry. No rash noted. He is not diaphoretic.  R facial region at cheek with scaling lesion 56m x 3 mm.  Psychiatric: He has a normal mood and affect. His behavior is normal.   Results for orders placed in visit on 10/30/13  COMPLETE METABOLIC PANEL WITH GFR      Result Value Ref Range   Sodium 140  135 - 145 mEq/L   Potassium 4.9  3.5 - 5.3 mEq/L   Chloride 103  96 - 112 mEq/L   CO2 29  19 - 32 mEq/L   Glucose, Bld 140 (*) 70 - 99 mg/dL   BUN 15  6 - 23 mg/dL   Creat 0.89  0.50 - 1.35 mg/dL   Total Bilirubin 0.5  0.2 - 1.2 mg/dL   Alkaline Phosphatase 40  39 - 117 U/L   AST 17  0 - 37 U/L   ALT 20  0 - 53 U/L   Total  Protein 6.7  6.0 - 8.3 g/dL   Albumin 4.6  3.5 - 5.2 g/dL   Calcium 9.7  8.4 - 10.5 mg/dL   GFR, Est African American >89     GFR, Est Non African American >89    CBC WITH DIFFERENTIAL      Result Value Ref Range   WBC 7.5  4.0 - 10.5 K/uL   RBC 4.92  4.22 - 5.81 MIL/uL   Hemoglobin 14.6  13.0 - 17.0 g/dL   HCT 42.2  39.0 - 52.0 %   MCV 85.8  78.0 - 100.0 fL   MCH 29.7  26.0 - 34.0 pg   MCHC 34.6  30.0 - 36.0 g/dL   RDW 13.4  11.5 - 15.5 %   Platelets 292  150 - 400 K/uL   Neutrophils Relative % 62  43 - 77 %   Neutro Abs 4.7  1.7 - 7.7 K/uL   Lymphocytes Relative 27  12 - 46 %   Lymphs Abs 2.0  0.7 - 4.0 K/uL   Monocytes Relative 9  3 - 12 %   Monocytes Absolute 0.7  0.1 - 1.0 K/uL   Eosinophils Relative 2  0 - 5 %   Eosinophils Absolute 0.2  0.0 - 0.7 K/uL   Basophils Relative 0  0 - 1 %   Basophils Absolute 0.0  0.0 - 0.1 K/uL   Smear Review Criteria for review not met    LIPID PANEL      Result Value Ref Range   Cholesterol 115  0 - 200 mg/dL   Triglycerides 58  <150  mg/dL   HDL 40  >39 mg/dL   Total CHOL/HDL Ratio 2.9     VLDL 12  0 - 40 mg/dL   LDL Cholesterol 63  0 - 99 mg/dL  POCT GLYCOSYLATED HEMOGLOBIN (HGB A1C)      Result Value Ref Range   Hemoglobin A1C 6.0         Assessment & Plan:  Type II or unspecified type diabetes mellitus without mention of complication, not stated as uncontrolled - Plan: CBC with Differential, Comprehensive metabolic panel, Lipid panel  Pure hypercholesterolemia - Plan: CBC with Differential, Comprehensive metabolic panel, Lipid panel  Essential hypertension, benign - Plan: CBC with Differential, Comprehensive metabolic panel, Lipid panel  Degenerative disc disease, cervical  Actinic keratosis  History of melanoma  Gastroesophageal reflux disease, esophagitis presence not specified  1.  DMII: controlled; obtain labs; continue current medications. 2.  HTN: controlled; obtain labs; continue current medications. 3.   Hyperlipidemia: likely uncontrolled due to non-compliance with statin; will refill Simvastatin. 4.  DDD cervical: persistent pain with intermittent radicular symptoms; improved temporarily with Prednisone taper; patient declined referral to ortho today again due to expense.   5.  Actinic keratosis facial R: New.  Recommend evaluation by dermatology; pt to call for appointment. 6.  H/o melanoma: due for annual skin exam; to call dermatology for appointment. 7. GERD: controlled; no change in management.  No orders of the defined types were placed in this encounter.    Return in about 3 months (around 05/08/2014) for complete physical examiniation.    Reginia Forts, M.D.  [facility] [address] [place], [place]  [address] [phone] phone [phone] fax

## 2014-02-06 ENCOUNTER — Encounter: Payer: Self-pay | Admitting: Family Medicine

## 2014-02-06 LAB — HEMOGLOBIN A1C
Hgb A1c MFr Bld: 6.4 % — ABNORMAL HIGH (ref <5.7)
Mean Plasma Glucose: 137 mg/dL — ABNORMAL HIGH (ref <117)

## 2014-02-08 ENCOUNTER — Encounter: Payer: Self-pay | Admitting: Family Medicine

## 2014-02-22 ENCOUNTER — Other Ambulatory Visit: Payer: Self-pay | Admitting: Family Medicine

## 2014-05-09 ENCOUNTER — Ambulatory Visit (INDEPENDENT_AMBULATORY_CARE_PROVIDER_SITE_OTHER): Payer: 59 | Admitting: Family Medicine

## 2014-05-09 ENCOUNTER — Encounter: Payer: Self-pay | Admitting: Family Medicine

## 2014-05-09 VITALS — BP 110/60 | HR 78 | Temp 98.1°F | Resp 16 | Ht 69.0 in | Wt 230.4 lb

## 2014-05-09 DIAGNOSIS — R0982 Postnasal drip: Secondary | ICD-10-CM

## 2014-05-09 DIAGNOSIS — N529 Male erectile dysfunction, unspecified: Secondary | ICD-10-CM

## 2014-05-09 DIAGNOSIS — K219 Gastro-esophageal reflux disease without esophagitis: Secondary | ICD-10-CM

## 2014-05-09 DIAGNOSIS — E119 Type 2 diabetes mellitus without complications: Secondary | ICD-10-CM

## 2014-05-09 DIAGNOSIS — E7800 Pure hypercholesterolemia, unspecified: Secondary | ICD-10-CM

## 2014-05-09 DIAGNOSIS — Z0000 Encounter for general adult medical examination without abnormal findings: Secondary | ICD-10-CM

## 2014-05-09 DIAGNOSIS — Z23 Encounter for immunization: Secondary | ICD-10-CM

## 2014-05-09 DIAGNOSIS — Z125 Encounter for screening for malignant neoplasm of prostate: Secondary | ICD-10-CM

## 2014-05-09 DIAGNOSIS — I10 Essential (primary) hypertension: Secondary | ICD-10-CM

## 2014-05-09 LAB — TSH: TSH: 1.731 u[IU]/mL (ref 0.350–4.500)

## 2014-05-09 LAB — CBC WITH DIFFERENTIAL/PLATELET
BASOS ABS: 0.1 10*3/uL (ref 0.0–0.1)
Basophils Relative: 1 % (ref 0–1)
Eosinophils Absolute: 0.4 10*3/uL (ref 0.0–0.7)
Eosinophils Relative: 4 % (ref 0–5)
HCT: 44.4 % (ref 39.0–52.0)
Hemoglobin: 15.4 g/dL (ref 13.0–17.0)
LYMPHS PCT: 25 % (ref 12–46)
Lymphs Abs: 2.3 10*3/uL (ref 0.7–4.0)
MCH: 29.6 pg (ref 26.0–34.0)
MCHC: 34.7 g/dL (ref 30.0–36.0)
MCV: 85.4 fL (ref 78.0–100.0)
Monocytes Absolute: 0.8 10*3/uL (ref 0.1–1.0)
Monocytes Relative: 9 % (ref 3–12)
Neutro Abs: 5.7 10*3/uL (ref 1.7–7.7)
Neutrophils Relative %: 61 % (ref 43–77)
Platelets: 321 10*3/uL (ref 150–400)
RBC: 5.2 MIL/uL (ref 4.22–5.81)
RDW: 13.3 % (ref 11.5–15.5)
WBC: 9.3 10*3/uL (ref 4.0–10.5)

## 2014-05-09 LAB — COMPLETE METABOLIC PANEL WITH GFR
ALK PHOS: 48 U/L (ref 39–117)
ALT: 20 U/L (ref 0–53)
AST: 17 U/L (ref 0–37)
Albumin: 4.7 g/dL (ref 3.5–5.2)
BUN: 13 mg/dL (ref 6–23)
CO2: 28 meq/L (ref 19–32)
Calcium: 9.6 mg/dL (ref 8.4–10.5)
Chloride: 101 mEq/L (ref 96–112)
Creat: 0.99 mg/dL (ref 0.50–1.35)
GFR, Est Non African American: 82 mL/min
Glucose, Bld: 142 mg/dL — ABNORMAL HIGH (ref 70–99)
Potassium: 4.9 mEq/L (ref 3.5–5.3)
Sodium: 137 mEq/L (ref 135–145)
TOTAL PROTEIN: 7 g/dL (ref 6.0–8.3)
Total Bilirubin: 1.1 mg/dL (ref 0.2–1.2)

## 2014-05-09 LAB — LIPID PANEL
Cholesterol: 109 mg/dL (ref 0–200)
HDL: 43 mg/dL (ref >39)
LDL CALC: 48 mg/dL (ref 0–99)
Total CHOL/HDL Ratio: 2.5 Ratio
Triglycerides: 90 mg/dL (ref <150)
VLDL: 18 mg/dL (ref 0–40)

## 2014-05-09 LAB — POCT URINALYSIS DIPSTICK
Bilirubin, UA: NEGATIVE
GLUCOSE UA: NEGATIVE
Ketones, UA: NEGATIVE
Leukocytes, UA: NEGATIVE
NITRITE UA: NEGATIVE
PROTEIN UA: NEGATIVE
RBC UA: NEGATIVE
Spec Grav, UA: 1.01
UROBILINOGEN UA: 0.2
pH, UA: 5.5

## 2014-05-09 LAB — MICROALBUMIN, URINE: MICROALB UR: 0.3 mg/dL (ref <2.0)

## 2014-05-09 LAB — HEMOGLOBIN A1C
Hgb A1c MFr Bld: 6.6 % — ABNORMAL HIGH (ref <5.7)
Mean Plasma Glucose: 143 mg/dL — ABNORMAL HIGH (ref <117)

## 2014-05-09 NOTE — Patient Instructions (Signed)

## 2014-05-09 NOTE — Progress Notes (Signed)
Subjective:    Patient ID: Joseph Collier, male    DOB: 22-Apr-1951, 63 y.o.   MRN: [id number]  This chart was scribed for Wardell Honour, MD by Rosary Lively, ED scribe. This patient was seen in room Room/bed 21 and the patient's care was started at 9:22 AM.  HPI HPI Comments:  Joseph Collier is a 63 y.o. male who presents to [facility] for a complete physical exam. Pt's last physical was on 04/30/2013. His weight is down 1 lbs from his visit 3 months ago. Pt has lost 30 lbs in the past year because he was about to go on insulin for diabetes.   DMII: Pt checks his sugars and reports that he has not had anything over 170. Pt reports that his brother is diabetic.  Patient reports good compliance with medication, good tolerance to medication, and good symptom control.    Medications: Pt reports that he is taking aspirin, and all medications as prescribed. Pt is allergic to penicillin and tetracycline.  Diet and Exercise: Pt reports that he drinks beer approximately 3 days a week. Pt walks at his job everyday, but is unable to walk as exercise due to work schedule. Pt is currently working 65 hours a week.   Eye Exam: Referred to Ophthalmology last year at physical.  Pt reports that he saw eye doctor last year, Dr. Idolina Primer. Pt reports that he was supposed to visit 6 months ago, but did not attend.  Agreeable to follow-up now with Dr. Idolina Primer.  Dental Exam: Pt reports that he went to an appointment 3 months ago.  Facial skin lesion/Dermatologist: Pt reports that he has an appointment scheduled.  Snoring: Pt has not had a sleep study performed. Pt is unsure if he snores. Pt does not have any trouble falling asleep, or staying asleep. He feels refreshed upon awakening. He does not nap frequently.  Emotions: Pt reports that he gets anxious everyday, but it is work related.  Colonoscopy: 2009 by Dr. [name] and it was normal. Tetanus: 2013 Pneumovax: 2009 Flu Vaccine: 2014, Pt denies getting this year's vaccine  at any other location. Shingles Vaccine: Insurance does not pay for shingles vaccine.   Review of Systems  Constitutional: Negative for fever, chills, diaphoresis, activity change, appetite change, fatigue and unexpected weight change.  HENT: Positive for tinnitus. Negative for congestion, dental problem, drooling, ear discharge, ear pain, facial swelling, hearing loss, mouth sores, nosebleeds, postnasal drip, rhinorrhea, sinus pressure, sneezing, sore throat, trouble swallowing and voice change.   Eyes: Negative for photophobia, pain, discharge, redness, itching and visual disturbance.  Respiratory: Positive for shortness of breath (Climbing a great deal of stairs.). Negative for apnea, cough, choking, chest tightness, wheezing and stridor.   Cardiovascular: Negative for chest pain, palpitations and leg swelling.  Gastrointestinal: Negative for nausea, vomiting, abdominal pain, diarrhea, constipation, blood in stool, abdominal distention, anal bleeding and rectal pain.  Endocrine: Negative for cold intolerance, heat intolerance, polydipsia, polyphagia and polyuria.  Genitourinary: Negative for dysuria, urgency, frequency, hematuria, flank pain, decreased urine volume, discharge, penile swelling, scrotal swelling, enuresis, difficulty urinating, genital sores, penile pain and testicular pain.  Musculoskeletal: Positive for neck pain and neck stiffness. Negative for arthralgias, back pain, gait problem, joint swelling and myalgias.  Skin: Negative for color change, pallor, rash and wound.  Allergic/Immunologic: Positive for environmental allergies. Negative for food allergies and immunocompromised state.  Neurological: Positive for headaches. Negative for dizziness, tremors, seizures, syncope, facial asymmetry, speech difficulty, weakness, light-headedness and numbness.  Hematological: Negative for adenopathy. Does not bruise/bleed easily.  Psychiatric/Behavioral: Negative for suicidal ideas,  hallucinations, behavioral problems, confusion, sleep disturbance, self-injury, dysphoric mood, decreased concentration and agitation. The patient is nervous/anxious. The patient is not hyperactive.        Objective:   Physical Exam  Nursing note and vitals reviewed. Constitutional: He is oriented to person, place, and time. He appears well-developed and well-nourished. No distress.  HENT:  Head: Normocephalic and atraumatic.  Right Ear: External ear normal.  Left Ear: External ear normal.  Nose: Nose normal.  Mouth/Throat: Oropharynx is clear and moist.  Eyes: Conjunctivae and EOM are normal. Pupils are equal, round, and reactive to light.  Neck: Normal range of motion. Neck supple. Carotid bruit is not present. No thyromegaly present.  Cardiovascular: Normal rate, regular rhythm, normal heart sounds and intact distal pulses.  Exam reveals no gallop and no friction rub.   No murmur heard. Pulmonary/Chest: Effort normal and breath sounds normal. He has no wheezes. He has no rales.  Abdominal: Soft. Bowel sounds are normal. He exhibits no distension and no mass. There is no tenderness. There is no rebound and no guarding. Hernia confirmed negative in the right inguinal area and confirmed negative in the left inguinal area.  Genitourinary: Rectum normal, prostate normal, testes normal and penis normal. Prostate is not enlarged and not tender. Right testis shows no mass, no swelling and no tenderness. Left testis shows no mass, no swelling and no tenderness. Circumcised.  Musculoskeletal: Normal range of motion.       Right shoulder: Normal.       Left shoulder: Normal.       Cervical back: Normal.  Lymphadenopathy:    He has no cervical adenopathy.       Right: No inguinal adenopathy present.       Left: No inguinal adenopathy present.  Neurological: He is alert and oriented to person, place, and time. He has normal reflexes. No cranial nerve deficit. He exhibits normal muscle tone.  Coordination normal.  Skin: Skin is warm and dry. No rash noted. He is not diaphoretic.  Psychiatric: He has a normal mood and affect. His behavior is normal. Judgment and thought content normal.   INFLUENZA VACCINE ADMINISTERED.    Assessment & Plan:  Type II or unspecified type diabetes mellitus without mention of complication, not stated as uncontrolled - Plan: CBC with Differential, COMPLETE METABOLIC PANEL WITH GFR, Microalbumin, urine, Hemoglobin A1c, HM Diabetes Foot Exam, glipiZIDE (GLUCOTROL XL) 5 MG 24 hr tablet, Saxagliptin-Metformin (KOMBIGLYZE XR) 2.12-998 MG TB24  Routine general medical examination at a health care facility  Pure hypercholesterolemia - Plan: Lipid panel, simvastatin (ZOCOR) 20 MG tablet  Essential hypertension, benign - Plan: COMPLETE METABOLIC PANEL WITH GFR, POCT urinalysis dipstick, TSH, lisinopril (PRINIVIL,ZESTRIL) 40 MG tablet  Screening for prostate cancer - Plan: PSA  Need for prophylactic vaccination and inoculation against influenza - Plan: Flu Vaccine QUAD 36+ mos IM  Post-nasal drip - Plan: ipratropium (ATROVENT) 0.03 % nasal spray  Erectile dysfunction, unspecified erectile dysfunction type - Plan: tadalafil (CIALIS) 20 MG tablet  Gastroesophageal reflux disease, esophagitis presence not specified - Plan: omeprazole (PRILOSEC) 20 MG capsule   1. Complete Physical Examination: anticipatory guidance --- continued weight loss, exercise.  Colonoscopy UTD.  Immunizations reviewed; s/p flu vaccine; insurance does not cover Zostavax. 2.  DMII: controlled; obtain labs; refills provided. 3. HTN; controlled; obtain labs,u/a; refills provided. 4.  Hyperlipidemia: controlled; obtain labs; continue current medications. 5.  Screening Prostate  cancer: DRE completed; obtain PSA. 6.  S/p flu vaccine. 7. GERD:controlled; refill provided. 8.  ED: controlled with Cialis.   Meds ordered this encounter  Medications  . glipiZIDE (GLUCOTROL XL) 5 MG 24 hr  tablet    Sig: Take 1 tablet (5 mg total) by mouth daily.    Dispense:  30 tablet    Refill:  11  . ipratropium (ATROVENT) 0.03 % nasal spray    Sig: Place 2 sprays into the nose 2 (two) times daily.    Dispense:  30 mL    Refill:  5  . lisinopril (PRINIVIL,ZESTRIL) 40 MG tablet    Sig: Take 1 tablet (40 mg total) by mouth daily.    Dispense:  30 tablet    Refill:  11  . omeprazole (PRILOSEC) 20 MG capsule    Sig: Take 1 capsule (20 mg total) by mouth daily.    Dispense:  30 capsule    Refill:  11  . Saxagliptin-Metformin (KOMBIGLYZE XR) 2.12-998 MG TB24    Sig: Take 2 tablets daily after evening meal.    Dispense:  60 tablet    Refill:  11  . simvastatin (ZOCOR) 20 MG tablet    Sig: Take 1 tablet (20 mg total) by mouth every other day.    Dispense:  30 tablet    Refill:  11  . tadalafil (CIALIS) 20 MG tablet    Sig: Take 1 tablet (20 mg total) by mouth daily as needed.    Dispense:  10 tablet    Refill:  11   I personally performed the services described in this documentation, which was scribed in my presence.  The recorded information has been reviewed and is accurate.  Reginia Forts, M.D.  [facility] [address] [place], [place]  [phone] phone [phone] fax

## 2014-05-10 DIAGNOSIS — N529 Male erectile dysfunction, unspecified: Secondary | ICD-10-CM | POA: Insufficient documentation

## 2014-05-10 LAB — PSA: PSA: 0.95 ng/mL (ref <=4.00)

## 2014-05-10 MED ORDER — LISINOPRIL 40 MG PO TABS: 40.0000 mg | ORAL_TABLET | Freq: Every day | ORAL | Status: DC

## 2014-05-10 MED ORDER — TADALAFIL 20 MG PO TABS: 20.0000 mg | ORAL_TABLET | Freq: Every day | ORAL | Status: DC | PRN

## 2014-05-10 MED ORDER — SAXAGLIPTIN-METFORMIN ER 2.5-1000 MG PO TB24: ORAL_TABLET | ORAL | Status: DC

## 2014-05-10 MED ORDER — SIMVASTATIN 20 MG PO TABS: 20.0000 mg | ORAL_TABLET | ORAL | Status: DC

## 2014-05-10 MED ORDER — GLIPIZIDE ER 5 MG PO TB24: 5.0000 mg | ORAL_TABLET | Freq: Every day | ORAL | Status: DC

## 2014-05-10 MED ORDER — IPRATROPIUM BROMIDE 0.03 % NA SOLN: 2.0000 | Freq: Two times a day (BID) | NASAL | Status: DC

## 2014-05-10 MED ORDER — OMEPRAZOLE 20 MG PO CPDR: 20.0000 mg | DELAYED_RELEASE_CAPSULE | Freq: Every day | ORAL | Status: DC

## 2014-07-29 ENCOUNTER — Other Ambulatory Visit: Payer: Self-pay | Admitting: Family Medicine

## 2014-08-08 ENCOUNTER — Ambulatory Visit: Payer: 59 | Admitting: Family Medicine

## 2014-08-15 ENCOUNTER — Encounter: Payer: Self-pay | Admitting: Family Medicine

## 2014-08-15 ENCOUNTER — Ambulatory Visit (INDEPENDENT_AMBULATORY_CARE_PROVIDER_SITE_OTHER): Payer: 59 | Admitting: Family Medicine

## 2014-08-15 VITALS — BP 110/70 | HR 93 | Temp 98.5°F | Resp 16 | Ht 68.75 in | Wt 240.0 lb

## 2014-08-15 DIAGNOSIS — E119 Type 2 diabetes mellitus without complications: Secondary | ICD-10-CM

## 2014-08-15 DIAGNOSIS — I10 Essential (primary) hypertension: Secondary | ICD-10-CM | POA: Diagnosis not present

## 2014-08-15 DIAGNOSIS — E7800 Pure hypercholesterolemia, unspecified: Secondary | ICD-10-CM

## 2014-08-15 DIAGNOSIS — S39012A Strain of muscle, fascia and tendon of lower back, initial encounter: Secondary | ICD-10-CM | POA: Diagnosis not present

## 2014-08-15 LAB — CBC WITH DIFFERENTIAL/PLATELET
Basophils Absolute: 0 10*3/uL (ref 0.0–0.1)
Basophils Relative: 0 % (ref 0–1)
EOS ABS: 0.2 10*3/uL (ref 0.0–0.7)
Eosinophils Relative: 2 % (ref 0–5)
HEMATOCRIT: 40.8 % (ref 39.0–52.0)
HEMOGLOBIN: 14.1 g/dL (ref 13.0–17.0)
Lymphocytes Relative: 22 % (ref 12–46)
Lymphs Abs: 1.9 10*3/uL (ref 0.7–4.0)
MCH: 29.6 pg (ref 26.0–34.0)
MCHC: 34.6 g/dL (ref 30.0–36.0)
MCV: 85.5 fL (ref 78.0–100.0)
MONOS PCT: 9 % (ref 3–12)
MPV: 9.2 fL (ref 8.6–12.4)
Monocytes Absolute: 0.8 10*3/uL (ref 0.1–1.0)
NEUTROS PCT: 67 % (ref 43–77)
Neutro Abs: 5.8 10*3/uL (ref 1.7–7.7)
Platelets: 298 10*3/uL (ref 150–400)
RBC: 4.77 MIL/uL (ref 4.22–5.81)
RDW: 13.5 % (ref 11.5–15.5)
WBC: 8.7 10*3/uL (ref 4.0–10.5)

## 2014-08-15 LAB — COMPLETE METABOLIC PANEL WITH GFR
ALBUMIN: 4.5 g/dL (ref 3.5–5.2)
ALT: 25 U/L (ref 0–53)
AST: 18 U/L (ref 0–37)
Alkaline Phosphatase: 35 U/L — ABNORMAL LOW (ref 39–117)
BILIRUBIN TOTAL: 0.6 mg/dL (ref 0.2–1.2)
BUN: 10 mg/dL (ref 6–23)
CALCIUM: 9.9 mg/dL (ref 8.4–10.5)
CHLORIDE: 103 meq/L (ref 96–112)
CO2: 28 meq/L (ref 19–32)
Creat: 0.97 mg/dL (ref 0.50–1.35)
GFR, Est Non African American: 84 mL/min
Glucose, Bld: 159 mg/dL — ABNORMAL HIGH (ref 70–99)
Potassium: 5.1 mEq/L (ref 3.5–5.3)
SODIUM: 141 meq/L (ref 135–145)
TOTAL PROTEIN: 6.7 g/dL (ref 6.0–8.3)

## 2014-08-15 LAB — LIPID PANEL
CHOL/HDL RATIO: 2.8 ratio
Cholesterol: 107 mg/dL (ref 0–200)
HDL: 38 mg/dL — AB (ref >39)
LDL Cholesterol: 48 mg/dL (ref 0–99)
Triglycerides: 105 mg/dL (ref <150)
VLDL: 21 mg/dL (ref 0–40)

## 2014-08-15 MED ORDER — METHOCARBAMOL 500 MG PO TABS: 500.0000 mg | ORAL_TABLET | Freq: Four times a day (QID) | ORAL | Status: DC

## 2014-08-15 MED ORDER — MELOXICAM 15 MG PO TABS: 15.0000 mg | ORAL_TABLET | Freq: Every day | ORAL | Status: DC

## 2014-08-15 NOTE — Progress Notes (Addendum)
Subjective:    Patient ID: Joseph Collier, male    DOB: 1950/08/20, 63 y.o.   MRN: [id number]  08/15/2014  Follow-up   HPI This 63 y.o. male presents for three month follow-up of the following:  1. DMII: no changes to management made at last visit.  Patient reports good compliance with medication, good tolerance to medication, and good symptom control.  Takes Saxagliptin-Metformin two at supper time around 6:00pm. Takes Glipizide every morning.  Fasting sugars 200.  Then may have a sugar later in the day as low as 68.   2.  Hyperlipidemia:  Patient reports good compliance with medication, good tolerance to medication, and good symptom control.  Denies headaches, dizziness, focal weakness, paresthesias.   3. HTN: Patient reports good compliance with medication, good tolerance to medication, and good symptom control.  Does not check BP at home.  Denies CP/palp/SOB/leg swelling.  4. R sided lower back pain: onset last week with slipping on walkways.No radiation into legs; no n/t/w.  No saddle paresthesias. No b/b dysfunction.   Review of Systems  Constitutional: Negative for fever, chills, diaphoresis, activity change, appetite change and fatigue.  Eyes: Negative for visual disturbance.  Respiratory: Negative for cough and shortness of breath.   Cardiovascular: Negative for chest pain, palpitations and leg swelling.  Endocrine: Negative for cold intolerance, heat intolerance, polydipsia, polyphagia and polyuria.  Musculoskeletal: Positive for myalgias and back pain.  Neurological: Negative for dizziness, tremors, seizures, syncope, facial asymmetry, speech difficulty, weakness, light-headedness, numbness and headaches.    Past Medical History  Diagnosis Date  . Type II or unspecified type diabetes mellitus without mention of complication, not stated as uncontrolled   . Essential hypertension, benign   . Other and unspecified hyperlipidemia   . Skin lesion 04/14/2007    melanoma    . Anemia, unspecified   . Obesity, unspecified   . Esophageal reflux   . Impotence of organic origin 04/14/2007  . Allergic rhinitis, cause unspecified   . Personal history of colonic polyps   . Essential hypertension, benign   . Cancer 08/17/2006    Melanoma  . Arthritis    Past Surgical History  Procedure Laterality Date  . Appendectomy    . Hernia repair      age 68  . Melanoma of forearm resection  1992    Left        [name]  . Tonsillectomy and adenoidectomy    . Arthroscopy of knee  2008    Left Knee  . Colonoscopy  08/18/2007    normal. [name].  . Joint replacement     Allergies  Allergen Reactions  . Penicillins Hives and Swelling  . Tetracyclines & Related Hives and Swelling        Objective:    BP 110/70 mmHg  Pulse 93  Temp(Src) 98.5 F (36.9 C) (Oral)  Resp 16  Ht 5' 8.75" (1.746 m)  Wt 240 lb (108.863 kg)  BMI 35.71 kg/m2  SpO2 96% Physical Exam  Constitutional: He is oriented to person, place, and time. He appears well-developed and well-nourished. No distress.  HENT:  Head: Normocephalic and atraumatic.  Right Ear: External ear normal.  Left Ear: External ear normal.  Nose: Nose normal.  Mouth/Throat: Oropharynx is clear and moist.  Eyes: Conjunctivae and EOM are normal. Pupils are equal, round, and reactive to light.  Neck: Normal range of motion. Neck supple. Carotid bruit is not present. No thyromegaly present.  Cardiovascular: Normal rate, regular rhythm, normal  heart sounds and intact distal pulses.  Exam reveals no gallop and no friction rub.   No murmur heard. Pulmonary/Chest: Effort normal and breath sounds normal. He has no wheezes. He has no rales.  Abdominal: Soft. Bowel sounds are normal. He exhibits no distension and no mass. There is no tenderness. There is no rebound and no guarding.  Musculoskeletal:       Lumbar back: He exhibits tenderness, pain and spasm. He exhibits normal range of motion, no bony tenderness and normal pulse.   Lumbar spine:  Non-tender midline; + paraspinal regions R.  Straight leg raises negative B; toe and heel walking intact; marching intact; motor 5/5 BLE.  Full ROM lumbar spine without limitation.   Lymphadenopathy:    He has no cervical adenopathy.  Neurological: He is alert and oriented to person, place, and time. No cranial nerve deficit.  Skin: Skin is warm and dry. No rash noted. He is not diaphoretic.  Psychiatric: He has a normal mood and affect. His behavior is normal.  Nursing note and vitals reviewed.       Assessment & Plan:   1. Diabetes mellitus without complication   2. Essential hypertension, benign   3. Elevated cholesterol   4. Lumbar strain, initial encounter     1. DMII: controlled; obtain labs; continue current medications. 2.  HTN: controlled; obtain labs; continue current medications. 3.  Hyperlipidemia: controlled; obtain labs; continue current medications. 4. Lumbar strain R:  New.  After slipping on ice.  Rx for Robaxin and Mobic provided; home exercise program provided.  Call in two weeks if no improvement for ortho referral.    Meds ordered this encounter  Medications  . methocarbamol (ROBAXIN) 500 MG tablet    Sig: Take 1 tablet (500 mg total) by mouth 4 (four) times daily.    Dispense:  40 tablet    Refill:  0  . meloxicam (MOBIC) 15 MG tablet    Sig: Take 1 tablet (15 mg total) by mouth daily.    Dispense:  30 tablet    Refill:  0    Return in about 3 months (around 11/14/2014) for recheck.    Chidubem Chaires Elayne Guerin, M.D. [facility] [address] [place],   [address] [phone] phone [phone] fax

## 2014-08-15 NOTE — Patient Instructions (Signed)

## 2014-08-16 LAB — HEMOGLOBIN A1C
Hgb A1c MFr Bld: 7 % — ABNORMAL HIGH (ref <5.7)
Mean Plasma Glucose: 154 mg/dL — ABNORMAL HIGH (ref <117)

## 2014-11-14 ENCOUNTER — Ambulatory Visit: Payer: 59 | Admitting: Family Medicine

## 2015-02-06 ENCOUNTER — Ambulatory Visit (INDEPENDENT_AMBULATORY_CARE_PROVIDER_SITE_OTHER): Payer: 59 | Admitting: Family Medicine

## 2015-02-06 ENCOUNTER — Encounter: Payer: Self-pay | Admitting: Family Medicine

## 2015-02-06 ENCOUNTER — Ambulatory Visit (INDEPENDENT_AMBULATORY_CARE_PROVIDER_SITE_OTHER): Payer: 59

## 2015-02-06 VITALS — BP 119/73 | HR 16 | Temp 98.3°F | Resp 98 | Ht 68.5 in | Wt 242.4 lb

## 2015-02-06 DIAGNOSIS — N529 Male erectile dysfunction, unspecified: Secondary | ICD-10-CM | POA: Diagnosis not present

## 2015-02-06 DIAGNOSIS — R0982 Postnasal drip: Secondary | ICD-10-CM | POA: Diagnosis not present

## 2015-02-06 DIAGNOSIS — K219 Gastro-esophageal reflux disease without esophagitis: Secondary | ICD-10-CM

## 2015-02-06 DIAGNOSIS — E7800 Pure hypercholesterolemia, unspecified: Secondary | ICD-10-CM

## 2015-02-06 DIAGNOSIS — E119 Type 2 diabetes mellitus without complications: Secondary | ICD-10-CM | POA: Diagnosis not present

## 2015-02-06 DIAGNOSIS — I10 Essential (primary) hypertension: Secondary | ICD-10-CM | POA: Diagnosis not present

## 2015-02-06 DIAGNOSIS — S39012D Strain of muscle, fascia and tendon of lower back, subsequent encounter: Secondary | ICD-10-CM

## 2015-02-06 LAB — CBC WITH DIFFERENTIAL/PLATELET
BASOS ABS: 0 10*3/uL (ref 0.0–0.1)
Basophils Relative: 0 % (ref 0–1)
Eosinophils Absolute: 0.3 10*3/uL (ref 0.0–0.7)
Eosinophils Relative: 3 % (ref 0–5)
HCT: 43.4 % (ref 39.0–52.0)
Hemoglobin: 14.6 g/dL (ref 13.0–17.0)
LYMPHS PCT: 28 % (ref 12–46)
Lymphs Abs: 2.8 10*3/uL (ref 0.7–4.0)
MCH: 29.6 pg (ref 26.0–34.0)
MCHC: 33.6 g/dL (ref 30.0–36.0)
MCV: 88 fL (ref 78.0–100.0)
MPV: 9.9 fL (ref 8.6–12.4)
Monocytes Absolute: 1 10*3/uL (ref 0.1–1.0)
Monocytes Relative: 10 % (ref 3–12)
Neutro Abs: 5.8 10*3/uL (ref 1.7–7.7)
Neutrophils Relative %: 59 % (ref 43–77)
Platelets: 312 10*3/uL (ref 150–400)
RBC: 4.93 MIL/uL (ref 4.22–5.81)
RDW: 13.2 % (ref 11.5–15.5)
WBC: 9.9 10*3/uL (ref 4.0–10.5)

## 2015-02-06 LAB — LIPID PANEL
CHOLESTEROL: 104 mg/dL (ref 0–200)
HDL: 38 mg/dL — ABNORMAL LOW (ref >=40)
LDL Cholesterol: 48 mg/dL (ref 0–99)
Total CHOL/HDL Ratio: 2.7 Ratio
Triglycerides: 91 mg/dL (ref <150)
VLDL: 18 mg/dL (ref 0–40)

## 2015-02-06 LAB — COMPREHENSIVE METABOLIC PANEL
ALT: 28 U/L (ref 0–53)
AST: 18 U/L (ref 0–37)
Albumin: 4.7 g/dL (ref 3.5–5.2)
Alkaline Phosphatase: 40 U/L (ref 39–117)
BILIRUBIN TOTAL: 1 mg/dL (ref 0.2–1.2)
BUN: 13 mg/dL (ref 6–23)
CO2: 24 mEq/L (ref 19–32)
Calcium: 9.9 mg/dL (ref 8.4–10.5)
Chloride: 103 mEq/L (ref 96–112)
Creat: 0.95 mg/dL (ref 0.50–1.35)
Glucose, Bld: 127 mg/dL — ABNORMAL HIGH (ref 70–99)
Potassium: 4.4 mEq/L (ref 3.5–5.3)
Sodium: 141 mEq/L (ref 135–145)
Total Protein: 7 g/dL (ref 6.0–8.3)

## 2015-02-06 LAB — HEMOGLOBIN A1C
Hgb A1c MFr Bld: 7.4 % — ABNORMAL HIGH (ref <5.7)
Mean Plasma Glucose: 166 mg/dL — ABNORMAL HIGH (ref <117)

## 2015-02-06 MED ORDER — OMEPRAZOLE 20 MG PO CPDR: 20.0000 mg | DELAYED_RELEASE_CAPSULE | Freq: Every day | ORAL | Status: DC

## 2015-02-06 MED ORDER — LISINOPRIL 40 MG PO TABS: 40.0000 mg | ORAL_TABLET | Freq: Every day | ORAL | Status: DC

## 2015-02-06 MED ORDER — IPRATROPIUM BROMIDE 0.03 % NA SOLN: 2.0000 | Freq: Two times a day (BID) | NASAL | Status: DC

## 2015-02-06 MED ORDER — TADALAFIL 20 MG PO TABS: 20.0000 mg | ORAL_TABLET | Freq: Every day | ORAL | Status: DC | PRN

## 2015-02-06 MED ORDER — SIMVASTATIN 20 MG PO TABS: 20.0000 mg | ORAL_TABLET | ORAL | Status: DC

## 2015-02-06 MED ORDER — SAXAGLIPTIN-METFORMIN ER 2.5-1000 MG PO TB24: ORAL_TABLET | ORAL | Status: DC

## 2015-02-06 NOTE — Patient Instructions (Signed)
Diabetes and Exercise Exercising regularly is important. It is not just about losing weight. It has many health benefits, such as:  Improving your overall fitness, flexibility, and endurance.  Increasing your bone density.  Helping with weight control.  Decreasing your body fat.  Increasing your muscle strength.  Reducing stress and tension.  Improving your overall health. People with diabetes who exercise gain additional benefits because exercise:  Reduces appetite.  Improves the body's use of blood sugar (glucose).  Helps lower or control blood glucose.  Decreases blood pressure.  Helps control blood lipids (such as cholesterol and triglycerides).  Improves the body's use of the hormone insulin by:  Increasing the body's insulin sensitivity.  Reducing the body's insulin needs.  Decreases the risk for heart disease because exercising:  Lowers cholesterol and triglycerides levels.  Increases the levels of good cholesterol (such as high-density lipoproteins [HDL]) in the body.  Lowers blood glucose levels. YOUR ACTIVITY PLAN  Choose an activity that you enjoy and set realistic goals. Your health care provider or diabetes educator can help you make an activity plan that works for you. Exercise regularly as directed by your health care provider. This includes:  Performing resistance training twice a week such as push-ups, sit-ups, lifting weights, or using resistance bands.  Performing 150 minutes of cardio exercises each week such as walking, running, or playing sports.  Staying active and spending no more than 90 minutes at one time being inactive. Even short bursts of exercise are good for you. Three 10-minute sessions spread throughout the day are just as beneficial as a single 30-minute session. Some exercise ideas include:  Taking the dog for a walk.  Taking the stairs instead of the elevator.  Dancing to your favorite song.  Doing an exercise  video.  Doing your favorite exercise with a friend. RECOMMENDATIONS FOR EXERCISING WITH TYPE 1 OR TYPE 2 DIABETES   Check your blood glucose before exercising. If blood glucose levels are greater than 240 mg/dL, check for urine ketones. Do not exercise if ketones are present.  Avoid injecting insulin into areas of the body that are going to be exercised. For example, avoid injecting insulin into:  The arms when playing tennis.  The legs when jogging.  Keep a record of:  Food intake before and after you exercise.  Expected peak times of insulin action.  Blood glucose levels before and after you exercise.  The type and amount of exercise you have done.  Review your records with your health care provider. Your health care provider will help you to develop guidelines for adjusting food intake and insulin amounts before and after exercising.  If you take insulin or oral hypoglycemic agents, watch for signs and symptoms of hypoglycemia. They include:  Dizziness.  Shaking.  Sweating.  Chills.  Confusion.  Drink plenty of water while you exercise to prevent dehydration or heat stroke. Body water is lost during exercise and must be replaced.  Talk to your health care provider before starting an exercise program to make sure it is safe for you. Remember, almost any type of activity is better than none. Document Released: 10/24/2003 Document Revised: 12/18/2013 Document Reviewed: 01/10/2013 ExitCare Patient Information 2015 [employer]. This information is not intended to replace advice given to you by your health care provider. Make sure you discuss any questions you have with your health care provider.  

## 2015-02-06 NOTE — Progress Notes (Signed)
Subjective:    Patient ID: Joseph Collier, male    DOB: 01-24-1951, 64 y.o.   MRN: [id number]  02/06/2015  Diabetes and Hypertension   HPI This 64 y.o. male presents for six month follow-up:  Went to [place] in March; stayed for 3 weeks.  Visiting son.    1. DMII:  Patient reports good compliance with medication, good tolerance to medication, and good symptom control.  Has gained weight since last visit.  Walking for exercise; no other exercise.  Sugars running  100-200.    Lumbar strain: improved but persistent.  Hurts every morning.  After morning, pain resolves.  Stiffness in morning. Can feel it while sliding out of truck.    2. HTN: Patient reports good compliance with medication, good tolerance to medication, and good symptom control.     3. Hyperlipidemia:  Patient reports good compliance with medication, good tolerance to medication, and good symptom control.    4. GERD: Patient reports good compliance with medication, good tolerance to medication, and good symptom control.      Review of Systems  Constitutional: Negative for fever, chills, diaphoresis, activity change, appetite change and fatigue.  Respiratory: Negative for cough and shortness of breath.   Cardiovascular: Negative for chest pain, palpitations and leg swelling.  Gastrointestinal: Negative for nausea, vomiting, abdominal pain and diarrhea.  Endocrine: Negative for cold intolerance, heat intolerance, polydipsia, polyphagia and polyuria.  Musculoskeletal: Positive for myalgias and back pain.  Skin: Negative for color change, rash and wound.  Neurological: Negative for dizziness, tremors, seizures, syncope, facial asymmetry, speech difficulty, weakness, light-headedness, numbness and headaches.  Psychiatric/Behavioral: Negative for sleep disturbance and dysphoric mood. The patient is not nervous/anxious.     Past Medical History  Diagnosis Date  . Type II or unspecified type diabetes mellitus without  mention of complication, not stated as uncontrolled   . Essential hypertension, benign   . Other and unspecified hyperlipidemia   . Skin lesion 04/14/2007    melanoma  . Anemia, unspecified   . Obesity, unspecified   . Esophageal reflux   . Impotence of organic origin 04/14/2007  . Allergic rhinitis, cause unspecified   . Personal history of colonic polyps   . Essential hypertension, benign   . Cancer 08/17/2006    Melanoma  . Arthritis    Past Surgical History  Procedure Laterality Date  . Appendectomy    . Hernia repair      age 55  . Melanoma of forearm resection  1992    Left        [name]  . Tonsillectomy and adenoidectomy    . Arthroscopy of knee  2008    Left Knee  . Colonoscopy  08/18/2007    normal. [name].  . Joint replacement     Allergies  Allergen Reactions  . Penicillins Hives and Swelling  . Tetracyclines & Related Hives and Swelling   Current Outpatient Prescriptions  Medication Sig Dispense Refill  . aspirin EC 81 MG tablet Take 81 mg by mouth daily.    . cetirizine (ZYRTEC) 10 MG tablet Take 10 mg by mouth daily as needed.    Marland Kitchen glipiZIDE (GLUCOTROL XL) 10 MG 24 hr tablet Take 1 tablet (10 mg total) by mouth daily. 30 tablet 11  . ipratropium (ATROVENT) 0.03 % nasal spray Place 2 sprays into the nose 2 (two) times daily. 30 mL 5  . lisinopril (PRINIVIL,ZESTRIL) 40 MG tablet Take 1 tablet (40 mg total) by mouth daily. 30 tablet  11  . omeprazole (PRILOSEC) 20 MG capsule Take 1 capsule (20 mg total) by mouth daily. 30 capsule 11  . PRESCRIPTION MEDICATION Clindamycin 150 mg taking 4 tabs for dental work.    . Saxagliptin-Metformin (KOMBIGLYZE XR) 2.12-998 MG TB24 Take 2 tablets daily after evening meal. 60 tablet 11  . simvastatin (ZOCOR) 20 MG tablet Take 1 tablet (20 mg total) by mouth daily. 30 tablet 11  . tadalafil (CIALIS) 20 MG tablet Take 1 tablet (20 mg total) by mouth daily as needed. 10 tablet 11  . zoster vaccine live, PF, (ZOSTAVAX) 63785 UNT/0.65ML  injection Inject 19,400 Units into the skin once. (Patient not taking: Reported on 02/06/2015) 1 each 0   No current facility-administered medications for this visit.   History   Social History  . Marital Status: Married    Spouse Name: [name]  . Number of Children: 1  . Years of Education: 12   Occupational History  . outside sales     engineered woods since 2000  .     Social History Main Topics  . Smoking status: Former Smoker -- 1.00 packs/day for 25 years    Types: Cigarettes  . Smokeless tobacco: Not on file     Comment: quit 1991  . Alcohol Use: 3.0 oz/week    5 Cans of beer per week     Comment: moderate: drinks hard liquor 7 drinks per month in 08/2011/4 drinks 4 a week  . Drug Use: No  . Sexual Activity: Yes    Birth Control/ Protection: Post-menopausal   Other Topics Concern  . Not on file   Social History Narrative   Marital status:  Married x 23 years, happily, second marriage.       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       Lives with wife.       Employment:  Press photographer.      Tobacco: none       Alcohol:         Drugs: none       Exercise: Light, walking, 3 times a week.        Caffeine use: carbonated beverages; consumes a minimal amount.        Seatbelt:  Always uses seat belts,        Guns:  Guns in the home stored in locked cabinet.        Home Safety:  Smoke Alarms in the home.        Education: [employer].        Objective:    BP 119/73 mmHg  Pulse 16  Temp(Src) 98.3 F (36.8 C) (Oral)  Resp 98  Ht 5' 8.5" (1.74 m)  Wt 242 lb 6.4 oz (109.952 kg)  BMI 36.32 kg/m2  SpO2 97% Physical Exam  Constitutional: He is oriented to person, place, and time. He appears well-developed and well-nourished. No distress.  HENT:  Head: Normocephalic and atraumatic.  Right Ear: External ear normal.  Left Ear: External ear normal.  Nose: Nose normal.  Mouth/Throat: Oropharynx is clear and moist.  Eyes: Conjunctivae and  EOM are normal. Pupils are equal, round, and reactive to light.  Neck: Normal range of motion. Neck supple. Carotid bruit is not present. No thyromegaly present.  Cardiovascular: Normal rate, regular rhythm, normal heart sounds and intact distal pulses.  Exam reveals no gallop and no friction rub.   No murmur heard. Pulmonary/Chest: Effort normal and breath  sounds normal. He has no wheezes. He has no rales.  Abdominal: Soft. Bowel sounds are normal. He exhibits no distension and no mass. There is no tenderness. There is no rebound and no guarding.  Musculoskeletal:       Lumbar back: Normal. He exhibits normal range of motion, no tenderness, no bony tenderness, no pain and no spasm.  Lumbar spine:  Non-tender midline; non-tender paraspinal regions B.  Straight leg raises negative B; toe and heel walking intact; marching intact; motor 5/5 BLE.  Full ROM lumbar spine without limitation.   Lymphadenopathy:    He has no cervical adenopathy.  Neurological: He is alert and oriented to person, place, and time. No cranial nerve deficit.  Skin: Skin is warm and dry. No rash noted. He is not diaphoretic.  Psychiatric: He has a normal mood and affect. His behavior is normal.  Nursing note and vitals reviewed.  Results for orders placed or performed in visit on 02/06/15  CBC with Differential/Platelet  Result Value Ref Range   WBC 9.9 4.0 - 10.5 K/uL   RBC 4.93 4.22 - 5.81 MIL/uL   Hemoglobin 14.6 13.0 - 17.0 g/dL   HCT 43.4 39.0 - 52.0 %   MCV 88.0 78.0 - 100.0 fL   MCH 29.6 26.0 - 34.0 pg   MCHC 33.6 30.0 - 36.0 g/dL   RDW 13.2 11.5 - 15.5 %   Platelets 312 150 - 400 K/uL   MPV 9.9 8.6 - 12.4 fL   Neutrophils Relative % 59 43 - 77 %   Neutro Abs 5.8 1.7 - 7.7 K/uL   Lymphocytes Relative 28 12 - 46 %   Lymphs Abs 2.8 0.7 - 4.0 K/uL   Monocytes Relative 10 3 - 12 %   Monocytes Absolute 1.0 0.1 - 1.0 K/uL   Eosinophils Relative 3 0 - 5 %   Eosinophils Absolute 0.3 0.0 - 0.7 K/uL   Basophils  Relative 0 0 - 1 %   Basophils Absolute 0.0 0.0 - 0.1 K/uL   Smear Review Criteria for review not met   Comprehensive metabolic panel  Result Value Ref Range   Sodium 141 135 - 145 mEq/L   Potassium 4.4 3.5 - 5.3 mEq/L   Chloride 103 96 - 112 mEq/L   CO2 24 19 - 32 mEq/L   Glucose, Bld 127 (H) 70 - 99 mg/dL   BUN 13 6 - 23 mg/dL   Creat 0.95 0.50 - 1.35 mg/dL   Total Bilirubin 1.0 0.2 - 1.2 mg/dL   Alkaline Phosphatase 40 39 - 117 U/L   AST 18 0 - 37 U/L   ALT 28 0 - 53 U/L   Total Protein 7.0 6.0 - 8.3 g/dL   Albumin 4.7 3.5 - 5.2 g/dL   Calcium 9.9 8.4 - 10.5 mg/dL  Hemoglobin A1c  Result Value Ref Range   Hgb A1c MFr Bld 7.4 (H) <5.7 %   Mean Plasma Glucose 166 (H) <117 mg/dL  Lipid panel  Result Value Ref Range   Cholesterol 104 0 - 200 mg/dL   Triglycerides 91 <150 mg/dL   HDL 38 (L) >=40 mg/dL   Total CHOL/HDL Ratio 2.7 Ratio   VLDL 18 0 - 40 mg/dL   LDL Cholesterol 48 0 - 99 mg/dL   [facility] reading (PRIMARY) by  Dr. Tamala Julian.  LUMBAR SPINE FILMS: NAD      Assessment & Plan:   1. Type 2 diabetes mellitus without complication   2. Erectile dysfunction, unspecified  erectile dysfunction type   3. Gastroesophageal reflux disease, esophagitis presence not specified   4. Pure hypercholesterolemia   5. Essential hypertension, benign   6. Post-nasal drip   7. Lumbar strain, subsequent encounter     Meds ordered this encounter  Medications  . ipratropium (ATROVENT) 0.03 % nasal spray    Sig: Place 2 sprays into the nose 2 (two) times daily.    Dispense:  30 mL    Refill:  5  . tadalafil (CIALIS) 20 MG tablet    Sig: Take 1 tablet (20 mg total) by mouth daily as needed.    Dispense:  10 tablet    Refill:  11  . omeprazole (PRILOSEC) 20 MG capsule    Sig: Take 1 capsule (20 mg total) by mouth daily.    Dispense:  30 capsule    Refill:  11  . DISCONTD: simvastatin (ZOCOR) 20 MG tablet    Sig: Take 1 tablet (20 mg total) by mouth every other day.    Dispense:  30  tablet    Refill:  11  . lisinopril (PRINIVIL,ZESTRIL) 40 MG tablet    Sig: Take 1 tablet (40 mg total) by mouth daily.    Dispense:  30 tablet    Refill:  11  . Saxagliptin-Metformin (KOMBIGLYZE XR) 2.12-998 MG TB24    Sig: Take 2 tablets daily after evening meal.    Dispense:  60 tablet    Refill:  11  . glipiZIDE (GLUCOTROL XL) 10 MG 24 hr tablet    Sig: Take 1 tablet (10 mg total) by mouth daily.    Dispense:  30 tablet    Refill:  11  . simvastatin (ZOCOR) 20 MG tablet    Sig: Take 1 tablet (20 mg total) by mouth daily.    Dispense:  30 tablet    Refill:  11    Return in about 3 months (around 05/09/2015) for complete physical examiniation.     Yilia Sacca Elayne Guerin, M.D. [facility] [address] [place], [place]  [address] [phone] phone [phone] fax

## 2015-02-07 MED ORDER — SIMVASTATIN 20 MG PO TABS: 20.0000 mg | ORAL_TABLET | Freq: Every day | ORAL | Status: DC

## 2015-02-07 MED ORDER — GLIPIZIDE ER 10 MG PO TB24: 10.0000 mg | ORAL_TABLET | Freq: Every day | ORAL | Status: DC

## 2015-03-06 IMAGING — CR DG CERVICAL SPINE COMPLETE 4+V
7 series · 7 of 7 positions shown · non-contrast
Comparison: None.

CLINICAL DATA: Neck pain.

EXAM:
CERVICAL SPINE  4+ VIEWS

[oblique (1 of 2)]
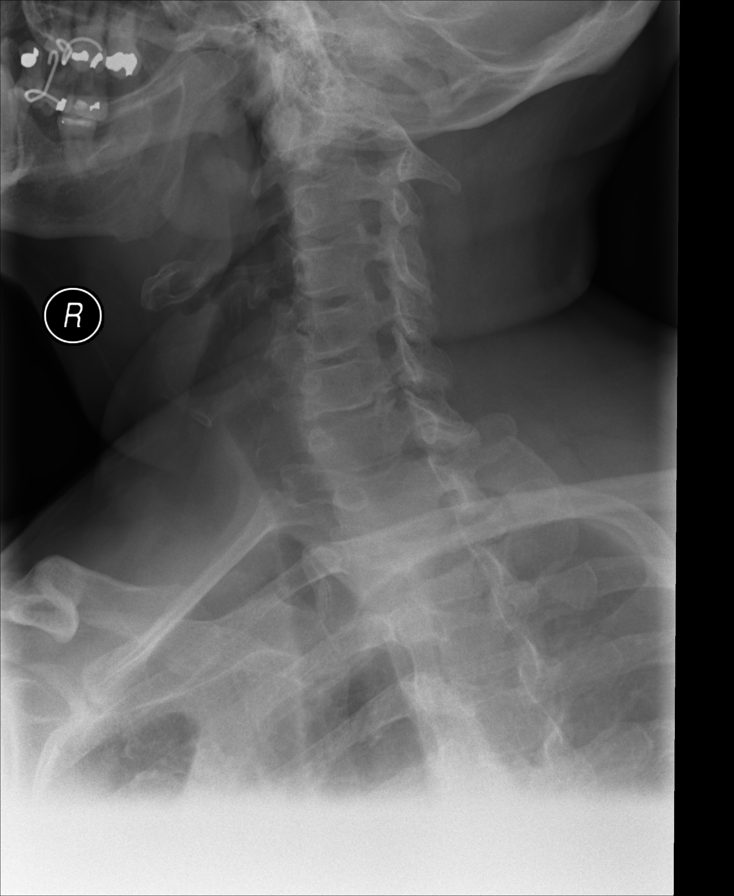

[oblique (2 of 2)]
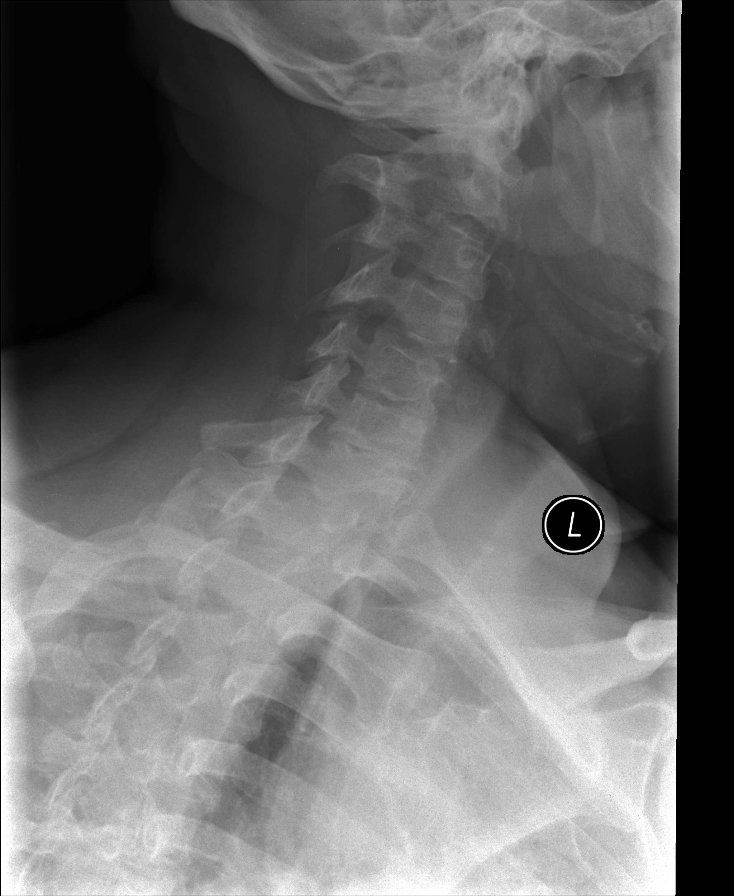

[lateral (1 of 2)]
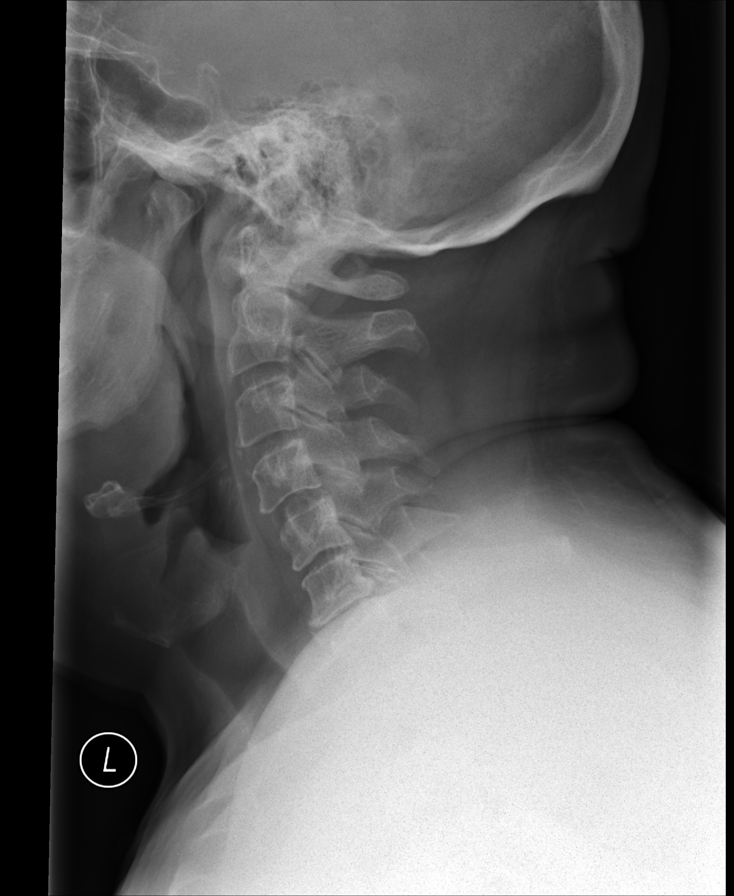

[AP (1 of 2)]
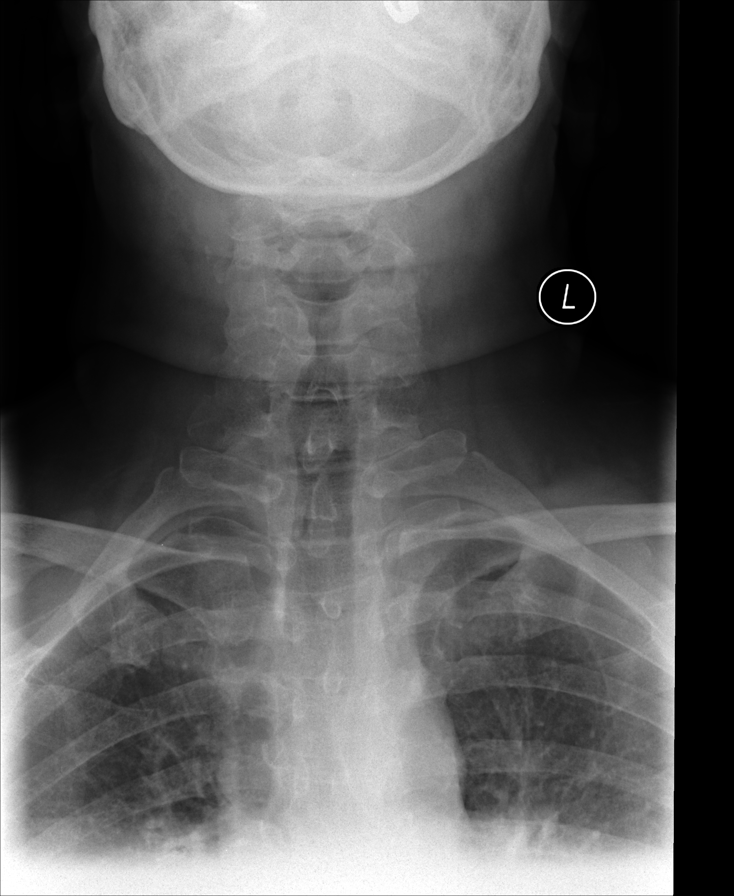

[AP (2 of 2)]
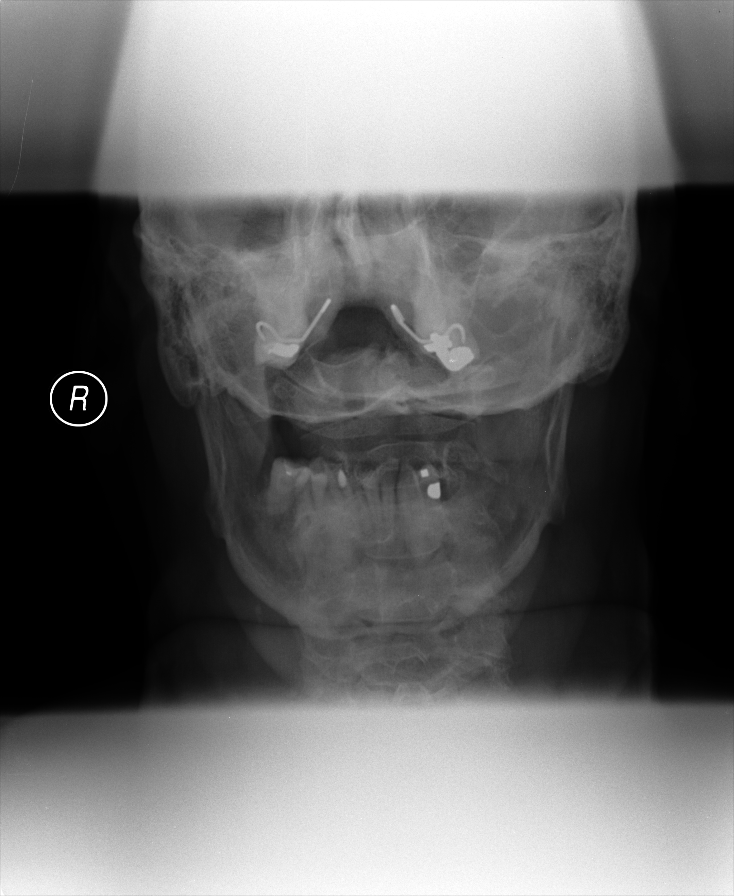

[lateral (2 of 2)]
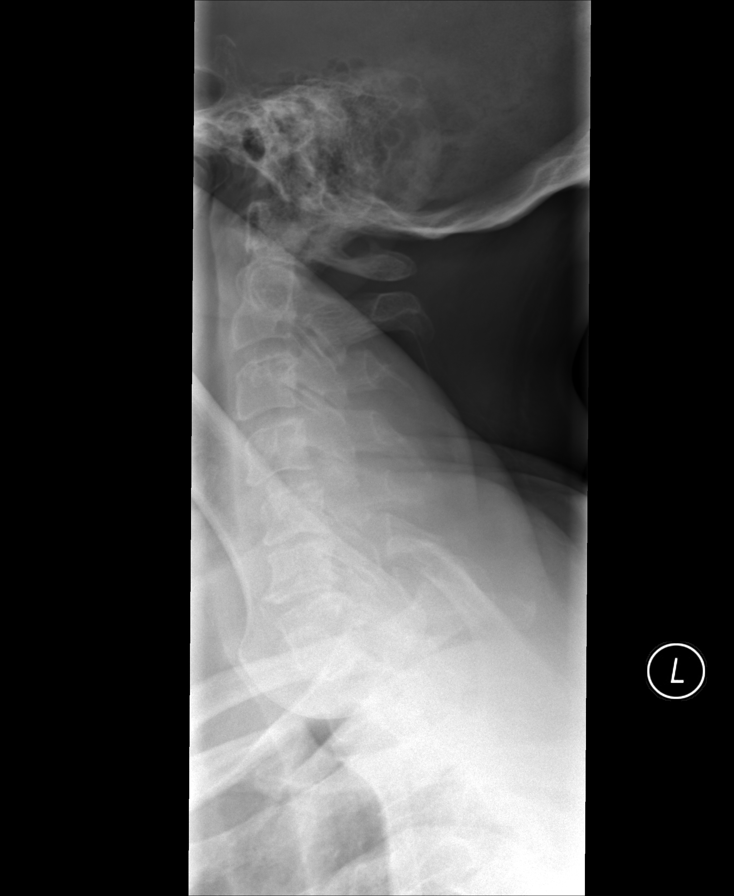

[other]
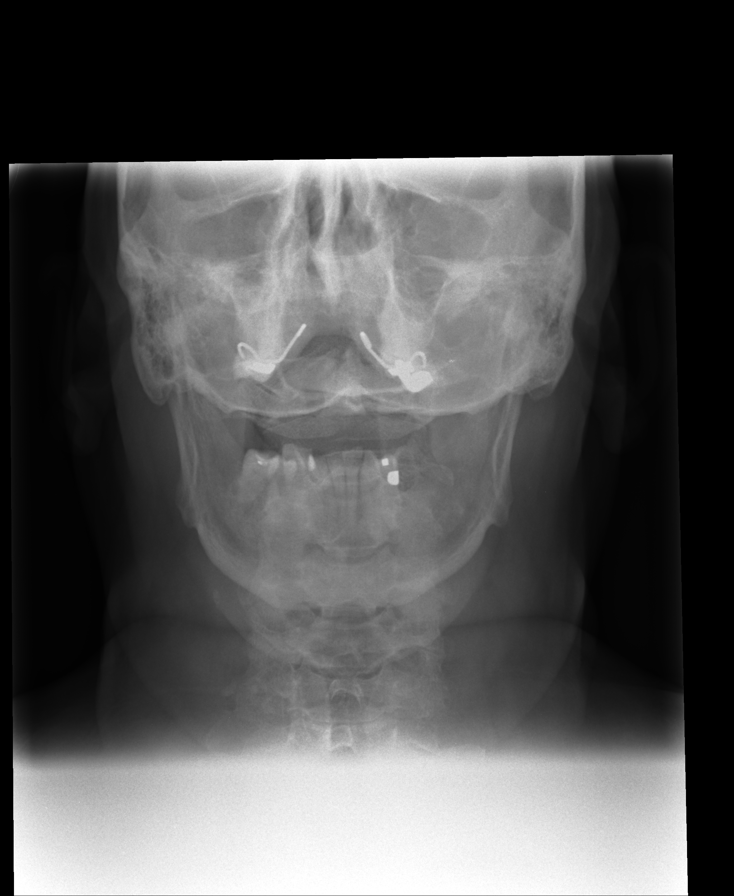

[7 of 7 positions shown; findings below may reference images not displayed]

FINDINGS: Moderate degenerative cervical spondylosis with multilevel disc
disease and facet disease. Mild multilevel degenerative subluxations
are noted. No acute bony findings or abnormal prevertebral soft
tissue swelling. The neural foramen are grossly patent except for
moderate bony foraminal narrowing on the left at C6-7. The C1-2
articulations are maintained. The lung apices are clear.
IMPRESSION: Degenerative cervical spondylosis.

Left-sided foraminal narrowing at C6-7.

## 2015-05-22 ENCOUNTER — Encounter: Payer: 59 | Admitting: Family Medicine

## 2015-05-24 ENCOUNTER — Ambulatory Visit (INDEPENDENT_AMBULATORY_CARE_PROVIDER_SITE_OTHER): Payer: 59 | Admitting: Family Medicine

## 2015-05-24 ENCOUNTER — Encounter: Payer: Self-pay | Admitting: Family Medicine

## 2015-05-24 VITALS — BP 133/83 | HR 87 | Temp 98.7°F | Resp 16 | Ht 69.0 in | Wt 244.0 lb

## 2015-05-24 DIAGNOSIS — Z86010 Personal history of colonic polyps: Secondary | ICD-10-CM | POA: Diagnosis not present

## 2015-05-24 DIAGNOSIS — Z8249 Family history of ischemic heart disease and other diseases of the circulatory system: Secondary | ICD-10-CM | POA: Diagnosis not present

## 2015-05-24 DIAGNOSIS — E1169 Type 2 diabetes mellitus with other specified complication: Secondary | ICD-10-CM | POA: Insufficient documentation

## 2015-05-24 DIAGNOSIS — Z0000 Encounter for general adult medical examination without abnormal findings: Secondary | ICD-10-CM

## 2015-05-24 DIAGNOSIS — Z23 Encounter for immunization: Secondary | ICD-10-CM | POA: Diagnosis not present

## 2015-05-24 DIAGNOSIS — Z114 Encounter for screening for human immunodeficiency virus [HIV]: Secondary | ICD-10-CM | POA: Diagnosis not present

## 2015-05-24 DIAGNOSIS — K219 Gastro-esophageal reflux disease without esophagitis: Secondary | ICD-10-CM | POA: Diagnosis not present

## 2015-05-24 DIAGNOSIS — Z85820 Personal history of malignant melanoma of skin: Secondary | ICD-10-CM

## 2015-05-24 DIAGNOSIS — R0982 Postnasal drip: Secondary | ICD-10-CM

## 2015-05-24 DIAGNOSIS — E7800 Pure hypercholesterolemia, unspecified: Secondary | ICD-10-CM

## 2015-05-24 DIAGNOSIS — E119 Type 2 diabetes mellitus without complications: Secondary | ICD-10-CM

## 2015-05-24 DIAGNOSIS — E669 Obesity, unspecified: Secondary | ICD-10-CM

## 2015-05-24 DIAGNOSIS — M5030 Other cervical disc degeneration, unspecified cervical region: Secondary | ICD-10-CM | POA: Diagnosis not present

## 2015-05-24 DIAGNOSIS — N529 Male erectile dysfunction, unspecified: Secondary | ICD-10-CM

## 2015-05-24 DIAGNOSIS — I10 Essential (primary) hypertension: Secondary | ICD-10-CM | POA: Diagnosis not present

## 2015-05-24 DIAGNOSIS — Z6836 Body mass index (BMI) 36.0-36.9, adult: Secondary | ICD-10-CM

## 2015-05-24 LAB — POCT URINALYSIS DIP (MANUAL ENTRY)
BILIRUBIN UA: NEGATIVE
Glucose, UA: NEGATIVE
LEUKOCYTES UA: NEGATIVE
Nitrite, UA: NEGATIVE
PH UA: 5
PROTEIN UA: NEGATIVE
RBC UA: NEGATIVE
Spec Grav, UA: 1.02
Urobilinogen, UA: 0.2

## 2015-05-24 LAB — CBC WITH DIFFERENTIAL/PLATELET
BASOS PCT: 0 % (ref 0–1)
Basophils Absolute: 0 10*3/uL (ref 0.0–0.1)
EOS ABS: 0.3 10*3/uL (ref 0.0–0.7)
EOS PCT: 3 % (ref 0–5)
HCT: 42.2 % (ref 39.0–52.0)
Hemoglobin: 15.1 g/dL (ref 13.0–17.0)
LYMPHS ABS: 2.8 10*3/uL (ref 0.7–4.0)
Lymphocytes Relative: 26 % (ref 12–46)
MCH: 30.6 pg (ref 26.0–34.0)
MCHC: 35.8 g/dL (ref 30.0–36.0)
MCV: 85.4 fL (ref 78.0–100.0)
MONOS PCT: 10 % (ref 3–12)
MPV: 9.5 fL (ref 8.6–12.4)
Monocytes Absolute: 1.1 10*3/uL — ABNORMAL HIGH (ref 0.1–1.0)
NEUTROS PCT: 61 % (ref 43–77)
Neutro Abs: 6.5 10*3/uL (ref 1.7–7.7)
PLATELETS: 335 10*3/uL (ref 150–400)
RBC: 4.94 MIL/uL (ref 4.22–5.81)
RDW: 13 % (ref 11.5–15.5)
WBC: 10.7 10*3/uL — ABNORMAL HIGH (ref 4.0–10.5)

## 2015-05-24 LAB — COMPREHENSIVE METABOLIC PANEL
ALT: 29 U/L (ref 9–46)
AST: 19 U/L (ref 10–35)
Albumin: 4.6 g/dL (ref 3.6–5.1)
Alkaline Phosphatase: 39 U/L — ABNORMAL LOW (ref 40–115)
BUN: 15 mg/dL (ref 7–25)
CO2: 27 mmol/L (ref 20–31)
Calcium: 9.8 mg/dL (ref 8.6–10.3)
Chloride: 103 mmol/L (ref 98–110)
Creat: 0.9 mg/dL (ref 0.70–1.25)
Glucose, Bld: 157 mg/dL — ABNORMAL HIGH (ref 65–99)
POTASSIUM: 4.7 mmol/L (ref 3.5–5.3)
Sodium: 140 mmol/L (ref 135–146)
TOTAL PROTEIN: 6.9 g/dL (ref 6.1–8.1)
Total Bilirubin: 0.6 mg/dL (ref 0.2–1.2)

## 2015-05-24 LAB — LIPID PANEL
CHOL/HDL RATIO: 3 ratio (ref <=5.0)
Cholesterol: 99 mg/dL — ABNORMAL LOW (ref 125–200)
HDL: 33 mg/dL — ABNORMAL LOW (ref >=40)
LDL CALC: 42 mg/dL (ref <130)
TRIGLYCERIDES: 121 mg/dL (ref <150)
VLDL: 24 mg/dL (ref <30)

## 2015-05-24 LAB — HEMOGLOBIN A1C
HEMOGLOBIN A1C: 6.9 % — AB (ref <5.7)
Mean Plasma Glucose: 151 mg/dL — ABNORMAL HIGH (ref <117)

## 2015-05-24 LAB — HIV ANTIBODY (ROUTINE TESTING W REFLEX): HIV: NONREACTIVE

## 2015-05-24 LAB — TSH: TSH: 0.861 u[IU]/mL (ref 0.350–4.500)

## 2015-05-24 MED ORDER — TADALAFIL 20 MG PO TABS: 20.0000 mg | ORAL_TABLET | Freq: Every day | ORAL | Status: DC | PRN

## 2015-05-24 MED ORDER — OMEPRAZOLE 20 MG PO CPDR: 20.0000 mg | DELAYED_RELEASE_CAPSULE | Freq: Every day | ORAL | Status: DC

## 2015-05-24 MED ORDER — SAXAGLIPTIN-METFORMIN ER 2.5-1000 MG PO TB24: ORAL_TABLET | ORAL | Status: DC

## 2015-05-24 MED ORDER — IPRATROPIUM BROMIDE 0.03 % NA SOLN: 2.0000 | Freq: Two times a day (BID) | NASAL | Status: DC

## 2015-05-24 MED ORDER — SIMVASTATIN 20 MG PO TABS: 20.0000 mg | ORAL_TABLET | Freq: Every day | ORAL | Status: DC

## 2015-05-24 MED ORDER — GLIPIZIDE ER 10 MG PO TB24: 10.0000 mg | ORAL_TABLET | Freq: Every day | ORAL | Status: DC

## 2015-05-24 NOTE — Progress Notes (Signed)
Subjective:    Patient ID: Joseph Collier, male    DOB: 1951/07/31, 64 y.o.   MRN: [id number]  HPI This 64 y.o. male presents for Complete Physical Examination.  Last physical:  05-09-2014 Colonoscopy:  2009; Dr. [name]; repeat 5 years. ? TDAP:  2013 Pneumovax:  2008 Zostavax:  Never; insurance does not cover it. Influenza: today; agreeable Eye exam:  2013; +glasses; Alois Cliche.  High deductible. Dental exam:  Every three to six months.   DMII: increased Glucotrol XL to 10mg  daily. Sugars running 110-200.  Hypercholesterolemia: increased statin to daily use.   Melanoma: followed yearly; [facility] at [facility] in [place] off of [address].   No prostate cancer in family.  Nocuturia x 1 only; no decrease in urinary stream.  Review of Systems  Constitutional: Negative.  Negative for fever, chills, diaphoresis, activity change, appetite change, fatigue and unexpected weight change.  HENT: Positive for tinnitus. Negative for congestion, dental problem, drooling, ear discharge, ear pain, facial swelling, hearing loss, mouth sores, nosebleeds, postnasal drip, rhinorrhea, sinus pressure, sneezing, sore throat, trouble swallowing and voice change.   Eyes: Negative.  Negative for photophobia, pain, discharge, redness, itching and visual disturbance.  Respiratory: Negative.  Negative for apnea, cough, choking, chest tightness, shortness of breath, wheezing and stridor.   Cardiovascular: Negative.  Negative for chest pain, palpitations and leg swelling.  Gastrointestinal: Negative.  Negative for nausea, vomiting, abdominal pain, diarrhea, constipation, blood in stool, abdominal distention, anal bleeding and rectal pain.  Endocrine: Positive for polydipsia and polyuria. Negative for cold intolerance, heat intolerance and polyphagia.  Genitourinary: Negative for dysuria, urgency, frequency, hematuria, flank pain, decreased urine volume, discharge, penile swelling, scrotal swelling, enuresis, difficulty  urinating, genital sores, penile pain and testicular pain.  Musculoskeletal: Positive for back pain. Negative for myalgias, joint swelling, arthralgias, gait problem, neck pain and neck stiffness.  Skin: Negative.  Negative for color change, pallor, rash and wound.  Allergic/Immunologic: Positive for environmental allergies. Negative for food allergies and immunocompromised state.  Neurological: Negative.  Negative for dizziness, tremors, seizures, syncope, facial asymmetry, speech difficulty, weakness, light-headedness, numbness and headaches.  Hematological: Negative.  Negative for adenopathy. Does not bruise/bleed easily.  Psychiatric/Behavioral: Negative.  Negative for suicidal ideas, hallucinations, behavioral problems, confusion, sleep disturbance, self-injury, dysphoric mood, decreased concentration and agitation. The patient is not nervous/anxious and is not hyperactive.    Past Medical History  Diagnosis Date  . Type II or unspecified type diabetes mellitus without mention of complication, not stated as uncontrolled   . Essential hypertension, benign   . Other and unspecified hyperlipidemia   . Skin lesion 04/14/2007    melanoma  . Anemia, unspecified   . Obesity, unspecified   . Esophageal reflux   . Impotence of organic origin 04/14/2007  . Allergic rhinitis, cause unspecified   . Personal history of colonic polyps   . Essential hypertension, benign   . Cancer ([place]) 08/17/2006    Melanoma  . Arthritis    Past Surgical History  Procedure Laterality Date  . Appendectomy    . Hernia repair      age 6  . Melanoma of forearm resection  1992    Left        [name]  . Tonsillectomy and adenoidectomy    . Arthroscopy of knee  2008    Left Knee  . Colonoscopy  08/18/2007    normal. [name].  . Joint replacement     Allergies  Allergen Reactions  . Penicillins Hives and  Swelling  . Tetracyclines & Related Hives and Swelling   Social History   Social History  . Marital Status:  Married    Spouse Name: [name]  . Number of Children: 1  . Years of Education: 12   Occupational History  . outside sales     engineered woods since 2000  .     Social History Main Topics  . Smoking status: Former Smoker -- 1.00 packs/day for 25 years    Types: Cigarettes  . Smokeless tobacco: Not on file     Comment: quit 1991  . Alcohol Use: 1.8 oz/week    3 Cans of beer per week     Comment: moderate: drinks hard liquor 7 drinks per month in 08/2011/4 drinks 4 a week  . Drug Use: No  . Sexual Activity: Yes    Birth Control/ Protection: Post-menopausal   Other Topics Concern  . Not on file   Social History Narrative   Marital status:  Married x 25 years, happily, second marriage.       Children:  1 son (97 yo.)  Son in [place], [place].       Lives with wife.       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      Tobacco: none       Alcohol:    3 drinks per night every other night.       Drugs: none       Exercise: Light, walking, 3 times a week.        Caffeine use: carbonated beverages; consumes a minimal amount.        Seatbelt:  Always uses seat belts, no texting.       Guns:  Guns in the home stored in locked cabinet.        Home Safety:  Smoke Alarms in the home.        Education: [employer].   Family History  Problem Relation Age of Onset  . Coronary artery disease Father   . Hypertension Father   . Heart disease Father     AMI cause of death  . Diabetes Father   . Cancer Mother 46    lung  . Hypertension Brother   . Diabetes Brother        Objective:   Physical Exam  Constitutional: He is oriented to person, place, and time. He appears well-developed and well-nourished. No distress.  HENT:  Head: Normocephalic and atraumatic.  Right Ear: External ear normal.  Left Ear: External ear normal.  Nose: Nose normal.  Mouth/Throat: Oropharynx is clear and moist.  Eyes: Conjunctivae and EOM are normal. Pupils  are equal, round, and reactive to light.  Neck: Normal range of motion. Neck supple. Carotid bruit is not present. No thyromegaly present.  Cardiovascular: Normal rate, regular rhythm, normal heart sounds and intact distal pulses.  Exam reveals no gallop and no friction rub.   No murmur heard. Pulmonary/Chest: Effort normal and breath sounds normal. He has no wheezes. He has no rales.  Abdominal: Soft. Bowel sounds are normal. He exhibits no distension and no mass. There is no tenderness. There is no rebound and no guarding. Hernia confirmed negative in the right inguinal area and confirmed negative in the left inguinal area.  Genitourinary: Testes normal and penis normal. Right testis shows no mass, no swelling and no tenderness. Left testis shows no mass, no swelling and no tenderness. Circumcised.  Musculoskeletal:  Right shoulder: Normal.       Left shoulder: Normal.       Cervical back: Normal.  Lymphadenopathy:    He has no cervical adenopathy.       Right: No inguinal adenopathy present.       Left: No inguinal adenopathy present.  Neurological: He is alert and oriented to person, place, and time. He has normal reflexes. No cranial nerve deficit. He exhibits normal muscle tone. Coordination normal.  Skin: Skin is warm and dry. No rash noted. He is not diaphoretic.  Psychiatric: He has a normal mood and affect. His behavior is normal. Judgment and thought content normal.    EKG  Pneumovax and influenza vaccines administered.     Assessment & Plan:   1. Routine physical examination   2. Pure hypercholesterolemia   3. History of melanoma   4. Need for prophylactic vaccination and inoculation against influenza   5. Gastroesophageal reflux disease, esophagitis presence not specified   6. Erectile dysfunction, unspecified erectile dysfunction type   7. Essential hypertension, benign   8. Degenerative disc disease, cervical   9. Screening for HIV (human immunodeficiency virus)    10. Type 2 diabetes mellitus without complication, without long-term current use of insulin ([place])   11. History of colonic polyps   12. Family history of cardiovascular disease   13. Need for prophylactic vaccination against Streptococcus pneumoniae (pneumococcus)   14. Post-nasal drip      1. Complete Physical Examination: anticipatory guidance --- weight loss, exercise, low fat food choices.  Refer for colonoscopy.  Counseled patient on new prostate cancer guidelines.  No family history of prostate cancer.  S/p Pneumovax and influenza vaccines. INSURANCE WILL NOT COVER ZOSTAVAX.   2.  Hypercholesterolemia: controlled; obtain labs; refill provided; pt tolerating daily statin. 3.  History of melanoma: stable; followed by dermatology annually. 4.  S/p flu vaccine and pneumovax. 5.  GERD: controlled; refill provided. 6.  HTN: controlled; obtain labs; refill of Lisinopril provided. 7.  DDD cervical and lumbar: stable; recommend Tylenol PM qhs. 8.  Screening HIV: per current CDC guidelines, obtain HIV. 9.  Colon polyp hx: overdue for colonoscopy; refer for colonoscopy. 10.  Family history of early CAD: asymptomatic; stable EKG. 11.  PND: stable; refill of Atrovent provided. 12.  DMII: uncontrolled; obtain labs; refills provided; HIGH DEDUCTIBLE; NON-COMPLIANT WITH EYE EXAM. 13. Obesity/BMI 36: encourage weight loss, exercise, low-fat and low-caloric food choices.   Meds ordered this encounter  Medications  . glipiZIDE (GLUCOTROL XL) 10 MG 24 hr tablet    Sig: Take 1 tablet (10 mg total) by mouth daily.    Dispense:  30 tablet    Refill:  11  . ipratropium (ATROVENT) 0.03 % nasal spray    Sig: Place 2 sprays into the nose 2 (two) times daily.    Dispense:  30 mL    Refill:  11  . omeprazole (PRILOSEC) 20 MG capsule    Sig: Take 1 capsule (20 mg total) by mouth daily.    Dispense:  30 capsule    Refill:  11  . Saxagliptin-Metformin (KOMBIGLYZE XR) 2.12-998 MG TB24    Sig: Take 2  tablets daily after evening meal.    Dispense:  60 tablet    Refill:  11  . simvastatin (ZOCOR) 20 MG tablet    Sig: Take 1 tablet (20 mg total) by mouth daily.    Dispense:  30 tablet    Refill:  11  . tadalafil (CIALIS)  20 MG tablet    Sig: Take 1 tablet (20 mg total) by mouth daily as needed.    Dispense:  10 tablet    Refill:  11   Orders Placed This Encounter  Procedures  . Flu Vaccine QUAD 36+ mos IM  . Pneumococcal polysaccharide vaccine 23-valent greater than or equal to 2yo subcutaneous/IM  . CBC with Differential/Platelet  . Comprehensive metabolic panel    Order Specific Question:  Has the patient fasted?    Answer:  Yes  . Hemoglobin A1c  . Lipid panel    Order Specific Question:  Has the patient fasted?    Answer:  Yes  . TSH  . HIV antibody  . Microalbumin, urine  . Ambulatory referral to Gastroenterology    Referral Priority:  Routine    Referral Type:  Consultation    Referral Reason:  Specialty Services Required    Referred to Provider:  Gatha Mayer, MD    Number of Visits Requested:  1  . POCT urinalysis dipstick  . EKG 12-Lead   Norwood Levo, M.D. [facility] [address] [place], [place]  [address] [phone] phone [phone] fax

## 2015-05-24 NOTE — Patient Instructions (Signed)

## 2015-05-25 LAB — MICROALBUMIN, URINE: Microalb, Ur: 0.3 mg/dL (ref <2.0)

## 2015-08-26 ENCOUNTER — Ambulatory Visit: Payer: 59 | Admitting: Family Medicine

## 2015-11-18 ENCOUNTER — Telehealth: Payer: Self-pay | Admitting: Family Medicine

## 2015-11-18 DIAGNOSIS — E119 Type 2 diabetes mellitus without complications: Secondary | ICD-10-CM

## 2015-11-18 NOTE — Telephone Encounter (Signed)
Pt has an appt in Aug for a DM f/u he states that he may need a refill of his DM before his appt please respond

## 2015-11-19 NOTE — Telephone Encounter (Signed)
His appt is in August. Can we refill until then or does pt have to come into the walk in?

## 2015-11-19 NOTE — Telephone Encounter (Signed)
He was given refills for a year in 05/2015 so he should have enough

## 2015-11-21 NOTE — Telephone Encounter (Signed)
LM for pt on VM that he should have RFs left from last Oct to cover him until after appt. Advised to call pharm and speak to someone to have them check for the Rxs sent on that date, and to CB if they do not have the RFs on file.

## 2016-02-04 ENCOUNTER — Other Ambulatory Visit: Payer: Self-pay | Admitting: Family Medicine

## 2016-02-18 ENCOUNTER — Other Ambulatory Visit: Payer: Self-pay | Admitting: Family Medicine

## 2016-02-26 ENCOUNTER — Other Ambulatory Visit: Payer: Self-pay | Admitting: Family Medicine

## 2016-02-27 ENCOUNTER — Ambulatory Visit (INDEPENDENT_AMBULATORY_CARE_PROVIDER_SITE_OTHER): Payer: Commercial Managed Care - HMO | Admitting: Family Medicine

## 2016-02-27 ENCOUNTER — Encounter: Payer: Self-pay | Admitting: Family Medicine

## 2016-02-27 VITALS — BP 143/85 | HR 80 | Ht 68.0 in | Wt 251.3 lb

## 2016-02-27 DIAGNOSIS — Z86010 Personal history of colonic polyps: Secondary | ICD-10-CM

## 2016-02-27 DIAGNOSIS — I10 Essential (primary) hypertension: Secondary | ICD-10-CM

## 2016-02-27 DIAGNOSIS — Z87891 Personal history of nicotine dependence: Secondary | ICD-10-CM | POA: Diagnosis not present

## 2016-02-27 DIAGNOSIS — E7800 Pure hypercholesterolemia, unspecified: Secondary | ICD-10-CM | POA: Diagnosis not present

## 2016-02-27 DIAGNOSIS — E119 Type 2 diabetes mellitus without complications: Secondary | ICD-10-CM

## 2016-02-27 DIAGNOSIS — K219 Gastro-esophageal reflux disease without esophagitis: Secondary | ICD-10-CM

## 2016-02-27 LAB — POCT GLYCOSYLATED HEMOGLOBIN (HGB A1C): Hemoglobin A1C: 8.3

## 2016-02-27 MED ORDER — LISINOPRIL 40 MG PO TABS: 40.0000 mg | ORAL_TABLET | Freq: Every day | ORAL | Status: DC

## 2016-02-27 MED ORDER — SIMVASTATIN 20 MG PO TABS: 20.0000 mg | ORAL_TABLET | Freq: Every day | ORAL | Status: DC

## 2016-02-27 MED ORDER — GLIPIZIDE ER 10 MG PO TB24: 10.0000 mg | ORAL_TABLET | Freq: Every day | ORAL | Status: DC

## 2016-02-27 MED ORDER — OMEPRAZOLE 20 MG PO CPDR: 20.0000 mg | DELAYED_RELEASE_CAPSULE | Freq: Every day | ORAL | Status: DC

## 2016-02-27 MED ORDER — SAXAGLIPTIN-METFORMIN ER 2.5-1000 MG PO TB24: 1.0000 | ORAL_TABLET | Freq: Two times a day (BID) | ORAL | Status: DC

## 2016-02-27 NOTE — Progress Notes (Signed)
Joseph Collier, D.O. Primary care at [facility]:    Chief Complaint  Patient presents with  . Establish Care   New pt, here to establish care.   HPI: Joseph Collier is a pleasant 65 y.o. male with a history of diabetes, hyperlipidemia and hypertension who presents to [facility] today for a new patient visit.    The patient used to see Joseph Collier, and has not had blood work or anything since October 2016.  Diabetes: Has has had it for approximately 14-15 years.   Admittedly has not been doing so well with his diabetes and his prudent diet.  He does not check his blood sugars. He has no side effects to the medicine and takes both of them daily.   Needs refills.   Review of the chart reveals well-controlled A1c's prior from 6.9-7.5.    Reminded patient about yearly eye and foot exams!  Hypertension and hypercholesterolemia approximately 10 years.  Tolerating meds well.  Needs refills. No prudent diet nor exercise.    30+ pack year history of tobacco use and quit in 1991        Past Medical History  Diagnosis Date  . Essential hypertension, benign   . Other and unspecified hyperlipidemia   . Skin lesion 04/14/2007    melanoma  . Anemia, unspecified   . Obesity, unspecified   . Impotence of organic origin 04/14/2007  . Allergic rhinitis, cause unspecified   . Personal history of colonic polyps   . Cancer ([place]) 08/17/2006    Melanoma  . Arthritis       Past Surgical History  Procedure Laterality Date  . Appendectomy    . Hernia repair      age 68  . Melanoma of forearm resection  1992    Left        Joseph Collier  . Tonsillectomy and adenoidectomy    . Arthroscopy of knee  2008    Left Knee  . Colonoscopy  08/18/2007    normal. Joseph Collier.  . Joint replacement        Family History  Problem Relation Age of Onset  . Coronary artery disease Father   . Hypertension Father   . Heart disease Father     AMI cause of  death  . Diabetes Father   . Cancer Mother 67    lung  . Hypertension Brother   . Diabetes Brother       History  Drug Use No  ,    History  Alcohol Use  . 3.0 oz/week  . 5 Cans of beer per week    Comment: moderate: drinks hard liquor 7 drinks per month in 08/2011/4 drinks 4 a week  ,    History  Smoking status  . Former Smoker -- 1.00 packs/day for 25 years  . Types: Cigarettes  . Quit date: 08/17/1989  Smokeless tobacco  . Not on file    Comment: quit 1991  ,     History  Sexual Activity  . Sexual Activity: Yes  . Birth Control/ Protection: Post-menopausal      Patient's Medications  New Prescriptions   No medications on file  Previous Medications   ASPIRIN EC 81 MG TABLET    Take 81 mg by mouth daily.   CETIRIZINE (ZYRTEC) 10 MG TABLET    Take 10 mg by mouth daily as needed.   IPRATROPIUM (ATROVENT) 0.03 %  NASAL SPRAY    Place 2 sprays into the nose 2 (two) times daily.   PRESCRIPTION MEDICATION    Clindamycin 150 mg taking 4 tabs for dental work.   TADALAFIL (CIALIS) 20 MG TABLET    Take 1 tablet (20 mg total) by mouth daily as needed.  Modified Medications   Modified Medication Previous Medication   GLIPIZIDE (GLUCOTROL XL) 10 MG 24 HR TABLET glipiZIDE (GLUCOTROL XL) 10 MG 24 hr tablet      Take 1 tablet (10 mg total) by mouth daily.    Take 1 tablet (10 mg total) by mouth daily.   LISINOPRIL (PRINIVIL,ZESTRIL) 40 MG TABLET lisinopril (PRINIVIL,ZESTRIL) 40 MG tablet      Take 1 tablet (40 mg total) by mouth daily.    TAKE 1 TABLET (40 MG TOTAL) BY MOUTH DAILY.   OMEPRAZOLE (PRILOSEC) 20 MG CAPSULE omeprazole (PRILOSEC) 20 MG capsule      Take 1 capsule (20 mg total) by mouth daily.    Take 1 capsule (20 mg total) by mouth daily.   SAXAGLIPTIN-METFORMIN (KOMBIGLYZE XR) 2.12-998 MG TB24 Saxagliptin-Metformin (KOMBIGLYZE XR) 2.12-998 MG TB24      Take 1 tablet by mouth 2 (two) times daily.    Take 1 tablet by mouth 2 (two) times daily.   SIMVASTATIN  (ZOCOR) 20 MG TABLET simvastatin (ZOCOR) 20 MG tablet      Take 1 tablet (20 mg total) by mouth daily at 6 PM.    Take 1 tablet (20 mg total) by mouth daily.  Discontinued Medications   GLIPIZIDE (GLUCOTROL XL) 10 MG 24 HR TABLET    TAKE 1 TABLET (10 MG TOTAL) BY MOUTH DAILY.   KOMBIGLYZE XR 2.12-998 MG TB24    TAKE 2 TABLETS DAILY AFTER EVENING MEAL.   OMEPRAZOLE (PRILOSEC) 20 MG CAPSULE    TAKE 1 CAPSULE (20 MG TOTAL) BY MOUTH DAILY.   SAXAGLIPTIN-METFORMIN (KOMBIGLYZE XR) 2.12-998 MG TB24    Take 2 tablets daily after evening meal.     Penicillins and Tetracyclines & related   Immunization History  Administered Date(s) Administered  . Influenza Split 07/18/2007, 04/25/2012  . Influenza,inj,Quad PF,36+ Mos 05/01/2013, 05/09/2014, 05/24/2015  . Pneumococcal Polysaccharide-23 05/24/2015  . Pneumococcal-Unspecified 08/17/2006  . Tdap 09/15/2011     <no information>   Fall Risk  05/24/2015 08/15/2014 05/01/2013  Falls in the past year? No No No     Depression screen [facility] 2/9 05/24/2015 02/06/2015 08/15/2014  Decreased Interest 0 0 0  Down, Depressed, Hopeless 0 0 0  PHQ - 2 Score 0 0 0      Review of Systems:   ( Completed via Adult Medical History Intake form today ) General:   Denies fever, chills, appetite changes, unexplained weight loss.  Optho/Auditory:   Denies visual changes, blurred vision/LOV, ringing in ears/ diff hearing Respiratory:   Denies SOB, DOE, cough, wheezing.  Cardiovascular:   Denies chest pain, palpitations, new onset peripheral edema  Gastrointestinal:   Denies nausea, vomiting, diarrhea.  Genitourinary:    Denies dysuria, increased frequency, flank pain.  Endocrine:     Denies hot or cold intolerance, polyuria, polydipsia. Musculoskeletal:  Denies unexplained myalgias, joint swelling, arthralgias, gait problems.  Skin:  Denies rash, suspicious lesions or new/ changes in moles Neurological:    Denies dizziness, syncope, unexplained weakness,  lightheadedness, numbness  Psychiatric/Behavioral:   Denies mood changes, suicidal or homicidal ideations, hallucinations    Objective:   Blood pressure 143/85, pulse 80, height 5\' 8"  (1.727 m),  weight 251 lb 4.8 oz (113.989 kg). Body mass index is 38.22 kg/(m^2).  General: Well Developed, well nourished, and in no acute distress.  Neuro: Alert and oriented x3, extra-ocular muscles intact, sensation grossly intact.  HEENT: Normocephalic, atraumatic, pupils equal round reactive to light, neck supple, no gross masses, no carotid bruits, no JVD apprec Skin: no gross suspicious lesions or rashes  Cardiac: Regular rate and rhythm, no murmurs rubs or gallops.  Respiratory: Essentially clear to auscultation bilaterally. Not using accessory muscles, speaking in full sentences.  Abdominal: Soft, not grossly distended Musculoskeletal: Ambulates w/o diff, FROM * 4 ext.  Vasc: less 2 sec cap RF, warm and pink  Psych:  No HI/SI, judgement and insight good.    Impression and Recommendations:    Essential hypertension, benign Fairly well-controlled blood pressure but not at goal since patient has diabetes.  Patient will check at home and keep a log, bring in next office visit. If not at goal, will add additional medication. Healthy lifestyle modifications advised, low-salt, drink more water, exercise.  Type 2 diabetes mellitus without complication, without long-term current use of insulin ([place]) Patient understands he will need to monitor fasting blood sugars and 2 hour postprandial after the largest meal of the day and any additional time he may feel ill or not well. Low-carb diet discussed in detail with patient. Advised to speed walk daily even for 5 minutes twice daily and work his way up to 30 minutes daily.  Diabetic retinopathy screening eye exam-referral sent   Morbidly obese [facility]) Health counseling performed. Advised healthier habits, healthier food choices and move more.  History of  tobacco abuse- 30+ pack year history quit 1991 Advised patient of the importance of yearly health screening examinations and tests. Follow-up near future.  Pure hypercholesterolemia We'll obtain fasting lipid profile in the near future, adjust meds as warranted. With diabetes, goal LDL 70 or less. Low saturated and Transfats diet. Read labels   Gave pt handout on blue lightAnd the detrimental effects of it since he says sometimes he has a difficult time sleeping but uses computer and/or watches TV late into the evenings.  At the end of our office visit he mentions phelgm---> just in the am's---> explained patient's likely from his long history of tobacco abuse. Advised to drink more water---> 1/2 wt in oz water per day!!! 7.5 bottles water.   Cut back on diet dr pepper.   A1c is 8.3 today.  This is NOT good.  I had a long discussion with patient today in the office to eat primarily protein, cut back on carbohydrates especially white flour products bread, rice, potatoes, and PRE-PACKAGED foods.   If it comes off of the tree or out of the ground or a bush, eat it... And swims or flys- eat it.    GOAL:  Try to walk- 5-22min a day.    The patient was counseled, risk factors were discussed, anticipatory guidance given.  Meds ordered this encounter  Medications  . DISCONTD: Saxagliptin-Metformin (KOMBIGLYZE XR) 2.12-998 MG TB24    Sig: Take 1 tablet by mouth 2 (two) times daily.  . simvastatin (ZOCOR) 20 MG tablet    Sig: Take 1 tablet (20 mg total) by mouth daily at 6 PM.    Dispense:  90 tablet    Refill:  1  . Saxagliptin-Metformin (KOMBIGLYZE XR) 2.12-998 MG TB24    Sig: Take 1 tablet by mouth 2 (two) times daily.    Dispense:  90 tablet  Refill:  1  . omeprazole (PRILOSEC) 20 MG capsule    Sig: Take 1 capsule (20 mg total) by mouth daily.    Dispense:  90 capsule    Refill:  1  . lisinopril (PRINIVIL,ZESTRIL) 40 MG tablet    Sig: Take 1 tablet (40 mg total) by mouth daily.     Dispense:  90 tablet    Refill:  1  . glipiZIDE (GLUCOTROL XL) 10 MG 24 hr tablet    Sig: Take 1 tablet (10 mg total) by mouth daily.    Dispense:  90 tablet    Refill:  1  Gross side effects, risk and benefits, and alternatives of medications discussed with patient.  Patient is aware that all medications have potential side effects and we are unable to predict every side effect or drug-drug interaction that may occur.  Expresses verbal understanding and consents to current therapy plan and treatment regimen.  Please see AVS handed out to patient at the end of our visit for further patient instructions/ counseling done pertaining to today's office visit.     Orders Placed This Encounter  Procedures  . Ambulatory referral to Ophthalmology    Referral Priority:  Routine    Referral Type:  Consultation    Referral Reason:  Specialty Services Required    Requested Specialty:  Ophthalmology    Number of Visits Requested:  1  . POCT glycosylated hemoglobin (Hb A1C)    Note: This document was prepared using Dragon voice recognition software and may include unintentional dictation errors.

## 2016-02-27 NOTE — Patient Instructions (Addendum)
Gave pt handout on blue light   phelgm---> in the am's---> drink more water---> 1/2 wt in oz water per day!!! 7.5 bottles water.   Cut back on diet dr pepper.   A1c is 8.3 today.  This is NOT good.  I had a long discussion with patient today in the office to eat primarily protein, cut back on carbohydrates especially white flour products bread, rice, potatoes, and PRE-PACKAGED foods.   If it comes off of the tree or out of the ground or a bush, eat it... And swims or flys- eat it.    Try to walk- 5-62min a day.      Diabetes and Exercise Exercising regularly is important. It is not just about losing weight. It has many health benefits, such as:  Improving your overall fitness, flexibility, and endurance.  Increasing your bone density.  Helping with weight control.  Decreasing your body fat.  Increasing your muscle strength.  Reducing stress and tension.  Improving your overall health. People with diabetes who exercise gain additional benefits because exercise:  Reduces appetite.  Improves the body's use of blood sugar (glucose).  Helps lower or control blood glucose.  Decreases blood pressure.  Helps control blood lipids (such as cholesterol and triglycerides).  Improves the body's use of the hormone insulin by:  Increasing the body's insulin sensitivity.  Reducing the body's insulin needs.  Decreases the risk for heart disease because exercising:  Lowers cholesterol and triglycerides levels.  Increases the levels of good cholesterol (such as high-density lipoproteins [HDL]) in the body.  Lowers blood glucose levels. YOUR ACTIVITY PLAN  Choose an activity that you enjoy, and set realistic goals. To exercise safely, you should begin practicing any new physical activity slowly, and gradually increase the intensity of the exercise over time. Your health care provider or diabetes educator can help create an activity plan that works for you. General recommendations  include:  Encouraging children to engage in at least 60 minutes of physical activity each day.  Stretching and performing strength training exercises, such as yoga or weight lifting, at least 2 times per week.  Performing a total of at least 150 minutes of moderate-intensity exercise each week, such as brisk walking or water aerobics.  Exercising at least 3 days per week, making sure you allow no more than 2 consecutive days to pass without exercising.  Avoiding long periods of inactivity (90 minutes or more). When you have to spend an extended period of time sitting down, take frequent breaks to walk or stretch. RECOMMENDATIONS FOR EXERCISING WITH TYPE 1 OR TYPE 2 DIABETES   Check your blood glucose before exercising. If blood glucose levels are greater than 240 mg/dL, check for urine ketones. Do not exercise if ketones are present.  Avoid injecting insulin into areas of the body that are going to be exercised. For example, avoid injecting insulin into:  The arms when playing tennis.  The legs when jogging.  Keep a record of:  Food intake before and after you exercise.  Expected peak times of insulin action.  Blood glucose levels before and after you exercise.  The type and amount of exercise you have done.  Review your records with your health care provider. Your health care provider will help you to develop guidelines for adjusting food intake and insulin amounts before and after exercising.  If you take insulin or oral hypoglycemic agents, watch for signs and symptoms of hypoglycemia. They include:  Dizziness.  Shaking.  Sweating.  Chills.  Confusion.  Drink plenty of water while you exercise to prevent dehydration or heat stroke. Body water is lost during exercise and must be replaced.  Talk to your health care provider before starting an exercise program to make sure it is safe for you. Remember, almost any type of activity is better than none.   This  information is not intended to replace advice given to you by your health care provider. Make sure you discuss any questions you have with your health care provider.   Document Released: 10/24/2003 Document Revised: 12/18/2014 Document Reviewed: 01/10/2013 Elsevier Interactive Patient Education [employer].

## 2016-03-05 ENCOUNTER — Other Ambulatory Visit: Payer: Commercial Managed Care - HMO

## 2016-03-05 DIAGNOSIS — Z1329 Encounter for screening for other suspected endocrine disorder: Secondary | ICD-10-CM

## 2016-03-05 DIAGNOSIS — Z130 Encounter for screening for diseases of the blood and blood-forming organs and certain disorders involving the immune mechanism: Secondary | ICD-10-CM

## 2016-03-05 DIAGNOSIS — E119 Type 2 diabetes mellitus without complications: Secondary | ICD-10-CM

## 2016-03-05 DIAGNOSIS — Z1321 Encounter for screening for nutritional disorder: Secondary | ICD-10-CM

## 2016-03-05 DIAGNOSIS — R5383 Other fatigue: Secondary | ICD-10-CM

## 2016-03-05 DIAGNOSIS — E7800 Pure hypercholesterolemia, unspecified: Secondary | ICD-10-CM

## 2016-03-06 LAB — VITAMIN B12: Vitamin B-12: 583 pg/mL (ref 200–1100)

## 2016-03-06 LAB — CBC WITH DIFFERENTIAL/PLATELET
Basophils Absolute: 0 cells/uL (ref 0–200)
Basophils Relative: 0 %
EOS PCT: 2 %
Eosinophils Absolute: 170 cells/uL (ref 15–500)
HCT: 41.4 % (ref 38.5–50.0)
HEMOGLOBIN: 13.8 g/dL (ref 13.2–17.1)
LYMPHS ABS: 2295 {cells}/uL (ref 850–3900)
Lymphocytes Relative: 27 %
MCH: 29.6 pg (ref 27.0–33.0)
MCHC: 33.3 g/dL (ref 32.0–36.0)
MCV: 88.7 fL (ref 80.0–100.0)
MPV: 9.2 fL (ref 7.5–12.5)
Monocytes Absolute: 935 cells/uL (ref 200–950)
Monocytes Relative: 11 %
NEUTROS ABS: 5100 {cells}/uL (ref 1500–7800)
NEUTROS PCT: 60 %
PLATELETS: 302 10*3/uL (ref 140–400)
RBC: 4.67 MIL/uL (ref 4.20–5.80)
RDW: 13.5 % (ref 11.0–15.0)
WBC: 8.5 10*3/uL (ref 3.8–10.8)

## 2016-03-06 LAB — LIPID PANEL
CHOL/HDL RATIO: 2.5 ratio (ref <=5.0)
Cholesterol: 94 mg/dL — ABNORMAL LOW (ref 125–200)
HDL: 38 mg/dL — AB (ref >=40)
LDL CALC: 36 mg/dL (ref <130)
Triglycerides: 98 mg/dL (ref <150)
VLDL: 20 mg/dL (ref <30)

## 2016-03-06 LAB — COMPLETE METABOLIC PANEL WITH GFR
ALBUMIN: 4.6 g/dL (ref 3.6–5.1)
ALK PHOS: 33 U/L — AB (ref 40–115)
ALT: 39 U/L (ref 9–46)
AST: 25 U/L (ref 10–35)
BUN: 13 mg/dL (ref 7–25)
CHLORIDE: 103 mmol/L (ref 98–110)
CO2: 24 mmol/L (ref 20–31)
Calcium: 9.6 mg/dL (ref 8.6–10.3)
Creat: 0.93 mg/dL (ref 0.70–1.25)
GFR, Est African American: >89 mL/min (ref >=60)
GFR, Est Non African American: 87 mL/min (ref >=60)
GLUCOSE: 144 mg/dL — AB (ref 65–99)
POTASSIUM: 4.6 mmol/L (ref 3.5–5.3)
SODIUM: 140 mmol/L (ref 135–146)
Total Bilirubin: 1 mg/dL (ref 0.2–1.2)
Total Protein: 6.9 g/dL (ref 6.1–8.1)

## 2016-03-06 LAB — VITAMIN D 25 HYDROXY (VIT D DEFICIENCY, FRACTURES): VIT D 25 HYDROXY: 38 ng/mL (ref 30–100)

## 2016-03-06 LAB — TSH: TSH: 1.21 mIU/L (ref 0.40–4.50)

## 2016-03-07 ENCOUNTER — Encounter: Payer: Self-pay | Admitting: Family Medicine

## 2016-03-07 DIAGNOSIS — Z87891 Personal history of nicotine dependence: Secondary | ICD-10-CM | POA: Insufficient documentation

## 2016-03-07 DIAGNOSIS — J309 Allergic rhinitis, unspecified: Secondary | ICD-10-CM | POA: Insufficient documentation

## 2016-03-07 DIAGNOSIS — Z86010 Personal history of colonic polyps: Secondary | ICD-10-CM | POA: Insufficient documentation

## 2016-03-07 NOTE — Assessment & Plan Note (Signed)
Patient understands he will need to monitor fasting blood sugars and 2 hour postprandial after the largest meal of the day and any additional time he may feel ill or not well. Low-carb diet discussed in detail with patient. Advised to speed walk daily even for 5 minutes twice daily and work his way up to 30 minutes daily.  Diabetic retinopathy screening eye exam-referral sent

## 2016-03-07 NOTE — Assessment & Plan Note (Signed)
Health counseling performed. Advised healthier habits, healthier food choices and move more.

## 2016-03-07 NOTE — Assessment & Plan Note (Signed)
Advised patient of the importance of yearly health screening examinations and tests. Follow-up near future.

## 2016-03-07 NOTE — Assessment & Plan Note (Signed)
Fairly well-controlled blood pressure but not at goal since patient has diabetes.  Patient will check at home and keep a log, bring in next office visit. If not at goal, will add additional medication. Healthy lifestyle modifications advised, low-salt, drink more water, exercise.

## 2016-03-07 NOTE — Assessment & Plan Note (Addendum)
We'll obtain fasting lipid profile in the near future, adjust meds as warranted. With diabetes, goal LDL 70 or less. Low saturated and Transfats diet. Read labels

## 2016-03-13 ENCOUNTER — Ambulatory Visit (INDEPENDENT_AMBULATORY_CARE_PROVIDER_SITE_OTHER): Payer: Commercial Managed Care - HMO | Admitting: Family Medicine

## 2016-03-13 ENCOUNTER — Encounter: Payer: Self-pay | Admitting: Family Medicine

## 2016-03-13 VITALS — BP 127/79 | HR 85 | Wt 247.9 lb

## 2016-03-13 DIAGNOSIS — E119 Type 2 diabetes mellitus without complications: Secondary | ICD-10-CM | POA: Diagnosis not present

## 2016-03-13 DIAGNOSIS — Z8249 Family history of ischemic heart disease and other diseases of the circulatory system: Secondary | ICD-10-CM

## 2016-03-13 DIAGNOSIS — I10 Essential (primary) hypertension: Secondary | ICD-10-CM

## 2016-03-13 DIAGNOSIS — E7800 Pure hypercholesterolemia, unspecified: Secondary | ICD-10-CM

## 2016-03-13 MED ORDER — SIMVASTATIN 20 MG PO TABS: ORAL_TABLET | ORAL | 1 refills | Status: DC

## 2016-03-13 NOTE — Patient Instructions (Addendum)
Make sure you read your labels and watch what you eat. Decrease saturated and Transfat intake and increase your activity levels.  Poly-or mono on saturated fats are the good. So we're going to eat back make sure you eat a good kind.  Either switch your multivitamin to one that has more vitamin D in it or use the same vitamin and get a vitamin D3 2000IU and take that daily       Guidelines for a Low Cholesterol, Low Saturated Fat Diet   Fats - Limit total intake of fats and oils. - Avoid butter, stick margarine, shortening, lard, palm and coconut oils. - Limit mayonnaise, salad dressings, gravies and sauces, unless they are homemade with low-fat ingredients. - Limit chocolate. - Choose low-fat and nonfat products, such as low-fat mayonnaise, low-fat or non-hydrogenated peanut butter, low-fat or fat-free salad dressings and nonfat gravy. - Use vegetable oil, such as canola or olive oil. - Look for margarine that does not contain trans fatty acids. - Use nuts in moderate amounts. - Read ingredient labels carefully to determine both amount and type of fat present in foods. Limit saturated and trans fats! - Avoid high-fat processed and convenience foods.  Meats and Meat Alternatives - Choose fish, chicken, [place] and lean meats. - Use dried beans, peas, lentils and tofu. - Limit egg yolks to three to four per week. - If you eat red meat, limit to no more than three servings per week and choose loin or round cuts. - Avoid fatty meats, such as bacon, sausage, franks, luncheon meats and ribs. - Avoid all organ meats, including liver.  Dairy - Choose nonfat or low-fat milk, yogurt and cottage cheese. - Most cheeses are high in fat. Choose cheeses made from non-fat milk, such as mozzarella and ricotta cheese. - Choose light or fat-free cream cheese and sour cream. - Avoid cream and sauces made with cream.  Fruits and Vegetables - Eat a wide variety of fruits and vegetables. - Use  lemon juice, vinegar or "mist" olive oil on vegetables. - Avoid adding sauces, fat or oil to vegetables.  Breads, Cereals and Grains - Choose whole-grain breads, cereals, pastas and rice. - Avoid high-fat snack foods, such as granola, cookies, pies, pastries, doughnuts and croissants.  Cooking Tips - Avoid deep fried foods. - Trim visible fat off meats and remove skin from poultry before cooking. - Bake, broil, boil, poach or roast poultry, fish and lean meats. - Drain and discard fat that drains out of meat as you cook it. - Add little or no fat to foods. - Use vegetable oil sprays to grease pans for cooking or baking. - Steam vegetables. - Use herbs or no-oil marinades to flavor foods.   Calorie Counting for Weight Loss Calories are energy you get from the things you eat and drink. Your body uses this energy to keep you going throughout the day. The number of calories you eat affects your weight. When you eat more calories than your body needs, your body stores the extra calories as fat. When you eat fewer calories than your body needs, your body burns fat to get the energy it needs. Calorie counting means keeping track of how many calories you eat and drink each day. If you make sure to eat fewer calories than your body needs, you should lose weight. In order for calorie counting to work, you will need to eat the number of calories that are right for you in a day to lose a  healthy amount of weight per week. A healthy amount of weight to lose per week is usually 1-2 lb (0.5-0.9 kg). A dietitian can determine how many calories you need in a day and give you suggestions on how to reach your calorie goal.  WHAT IS MY MY PLAN? My goal is to have __________ calories per day.  If I have this many calories per day, I should lose around __________ pounds per week. WHAT DO I NEED TO KNOW ABOUT CALORIE COUNTING? In order to meet your daily calorie goal, you will need to:  Find out how many  calories are in each food you would like to eat. Try to do this before you eat.  Decide how much of the food you can eat.  Write down what you ate and how many calories it had. Doing this is called keeping a food log. WHERE DO I FIND CALORIE INFORMATION? The number of calories in a food can be found on a Nutrition Facts label. Note that all the information on a label is based on a specific serving of the food. If a food does not have a Nutrition Facts label, try to look up the calories online or ask your dietitian for help. HOW DO I DECIDE HOW MUCH TO EAT? To decide how much of the food you can eat, you will need to consider both the number of calories in one serving and the size of one serving. This information can be found on the Nutrition Facts label. If a food does not have a Nutrition Facts label, look up the information online or ask your dietitian for help. Remember that calories are listed per serving. If you choose to have more than one serving of a food, you will have to multiply the calories per serving by the amount of servings you plan to eat. For example, the label on a package of bread might say that a serving size is 1 slice and that there are 90 calories in a serving. If you eat 1 slice, you will have eaten 90 calories. If you eat 2 slices, you will have eaten 180 calories. HOW DO I KEEP A FOOD LOG? After each meal, record the following information in your food log:  What you ate.  How much of it you ate.  How many calories it had.  Then, add up your calories. Keep your food log near you, such as in a small notebook in your pocket. Another option is to use a mobile app or website. Some programs will calculate calories for you and show you how many calories you have left each time you add an item to the log. WHAT ARE SOME CALORIE COUNTING TIPS?  Use your calories on foods and drinks that will fill you up and not leave you hungry. Some examples of this include foods like nuts  and nut butters, vegetables, lean proteins, and high-fiber foods (more than 5 g fiber per serving).  Eat nutritious foods and avoid empty calories. Empty calories are calories you get from foods or beverages that do not have many nutrients, such as candy and soda. It is better to have a nutritious high-calorie food (such as an avocado) than a food with few nutrients (such as a bag of chips).  Know how many calories are in the foods you eat most often. This way, you do not have to look up how many calories they have each time you eat them.  Look out for foods that may seem like  low-calorie foods but are really high-calorie foods, such as baked goods, soda, and fat-free candy.  Pay attention to calories in drinks. Drinks such as sodas, specialty coffee drinks, alcohol, and juices have a lot of calories yet do not fill you up. Choose low-calorie drinks like water and diet drinks.  Focus your calorie counting efforts on higher calorie items. Logging the calories in a garden salad that contains only vegetables is less important than calculating the calories in a milk shake.  Find a way of tracking calories that works for you. Get creative. Most people who are successful find ways to keep track of how much they eat in a day, even if they do not count every calorie. WHAT ARE SOME PORTION CONTROL TIPS?  Know how many calories are in a serving. This will help you know how many servings of a certain food you can have.  Use a measuring cup to measure serving sizes. This is helpful when you start out. With time, you will be able to estimate serving sizes for some foods.  Take some time to put servings of different foods on your favorite plates, bowls, and cups so you know what a serving looks like.  Try not to eat straight from a bag or box. Doing this can lead to overeating. Put the amount you would like to eat in a cup or on a plate to make sure you are eating the right portion.  Use smaller plates,  glasses, and bowls to prevent overeating. This is a quick and easy way to practice portion control. If your plate is smaller, less food can fit on it.  Try not to multitask while eating, such as watching TV or using your computer. If it is time to eat, sit down at a table and enjoy your food. Doing this will help you to start recognizing when you are full. It will also make you more aware of what and how much you are eating. HOW CAN I CALORIE COUNT WHEN EATING OUT?  Ask for smaller portion sizes or child-sized portions.  Consider sharing an entree and sides instead of getting your own entree.  If you get your own entree, eat only half. Ask for a box at the beginning of your meal and put the rest of your entree in it so you are not tempted to eat it.  Look for the calories on the menu. If calories are listed, choose the lower calorie options.  Choose dishes that include vegetables, fruits, whole grains, low-fat dairy products, and lean protein. Focusing on smart food choices from each of the 5 food groups can help you stay on track at restaurants.  Choose items that are boiled, broiled, grilled, or steamed.  Choose water, milk, unsweetened iced tea, or other drinks without added sugars. If you want an alcoholic beverage, choose a lower calorie option. For example, a regular margarita can have up to 700 calories and a glass of wine has around 150.  Stay away from items that are buttered, battered, fried, or served with cream sauce. Items labeled "crispy" are usually fried, unless stated otherwise.  Ask for dressings, sauces, and syrups on the side. These are usually very high in calories, so do not eat much of them.  Watch out for salads. Many people think salads are a healthy option, but this is often not the case. Many salads come with bacon, fried chicken, lots of cheese, fried chips, and dressing. All of these items have a lot of calories.  If you want a salad, choose a garden salad and ask  for grilled meats or steak. Ask for the dressing on the side, or ask for olive oil and vinegar or lemon to use as dressing.  Estimate how many servings of a food you are given. For example, a serving of cooked rice is  cup or about the size of half a tennis ball or one cupcake wrapper. Knowing serving sizes will help you be aware of how much food you are eating at restaurants. The list below tells you how big or small some common portion sizes are based on everyday objects.  1 oz--4 stacked dice.  3 oz--1 deck of cards.  1 tsp--1 dice.  1 Tbsp-- a Ping-Pong ball.  2 Tbsp--1 Ping-Pong ball.   cup--1 tennis ball or 1 cupcake wrapper.  1 cup--1 baseball.   This information is not intended to replace advice given to you by your health care provider. Make sure you discuss any questions you have with your health care provider.   Document Released: 08/03/2005 Document Revised: 08/24/2014 Document Reviewed: 06/08/2013 Elsevier Interactive Patient Education 2016 [employer].  Exercising to [employer] Exercising can help you to lose weight. In order to lose weight through exercise, you need to do vigorous-intensity exercise. You can tell that you are exercising with vigorous intensity if you are breathing very hard and fast and cannot hold a conversation while exercising. Moderate-intensity exercise helps to maintain your current weight. You can tell that you are exercising at a moderate level if you have a higher heart rate and faster breathing, but you are still able to hold a conversation. HOW OFTEN SHOULD I EXERCISE? Choose an activity that you enjoy and set realistic goals. Your health care provider can help you to make an activity plan that works for you. Exercise regularly as directed by your health care provider. This may include:  Doing resistance training twice each week, such as:  Push-ups.  Sit-ups.  Lifting weights.  Using resistance bands.  Doing a given intensity of  exercise for a given amount of time. Choose from these options:  150 minutes of moderate-intensity exercise every week.  75 minutes of vigorous-intensity exercise every week.  A mix of moderate-intensity and vigorous-intensity exercise every week. Children, pregnant women, people who are out of shape, people who are overweight, and older adults may need to consult a health care provider for individual recommendations. If you have any sort of medical condition, be sure to consult your health care provider before starting a new exercise program. WHAT ARE SOME ACTIVITIES THAT CAN HELP ME TO LOSE WEIGHT?   Walking at a rate of at least 4.5 miles an hour.  Jogging or running at a rate of 5 miles per hour.  Biking at a rate of at least 10 miles per hour.  Lap swimming.  Roller-skating or in-line skating.  Cross-country skiing.  Vigorous competitive sports, such as football, basketball, and soccer.  Jumping rope.  Aerobic dancing. HOW CAN I BE MORE ACTIVE IN MY DAY-TO-DAY ACTIVITIES?  Use the stairs instead of the elevator.  Take a walk during your lunch break.  If you drive, park your car farther away from work or school.  If you take public transportation, get off one stop early and walk the rest of the way.  Make all of your phone calls while standing up and walking around.  Get up, stretch, and walk around every 30 minutes throughout the day. WHAT GUIDELINES SHOULD I  FOLLOW WHILE EXERCISING?  Do not exercise so much that you hurt yourself, feel dizzy, or get very short of breath.  Consult your health care provider prior to starting a new exercise program.  Wear comfortable clothes and shoes with good support.  Drink plenty of water while you exercise to prevent dehydration or heat stroke. Body water is lost during exercise and must be replaced.  Work out until you breathe faster and your heart beats faster.   This information is not intended to replace advice given to  you by your health care provider. Make sure you discuss any questions you have with your health care provider.   Document Released: 09/05/2010 Document Revised: 08/24/2014 Document Reviewed: 01/04/2014 Elsevier Interactive Patient Education 2016 [employer]. Diabetes and Exercise Exercising regularly is important. It is not just about losing weight. It has many health benefits, such as:  Improving your overall fitness, flexibility, and endurance.  Increasing your bone density.  Helping with weight control.  Decreasing your body fat.  Increasing your muscle strength.  Reducing stress and tension.  Improving your overall health. People with diabetes who exercise gain additional benefits because exercise:  Reduces appetite.  Improves the body's use of blood sugar (glucose).  Helps lower or control blood glucose.  Decreases blood pressure.  Helps control blood lipids (such as cholesterol and triglycerides).  Improves the body's use of the hormone insulin by:  Increasing the body's insulin sensitivity.  Reducing the body's insulin needs.  Decreases the risk for heart disease because exercising:  Lowers cholesterol and triglycerides levels.  Increases the levels of good cholesterol (such as high-density lipoproteins [HDL]) in the body.  Lowers blood glucose levels. YOUR ACTIVITY PLAN  Choose an activity that you enjoy, and set realistic goals. To exercise safely, you should begin practicing any new physical activity slowly, and gradually increase the intensity of the exercise over time. Your health care provider or diabetes educator can help create an activity plan that works for you. General recommendations include:  Encouraging children to engage in at least 60 minutes of physical activity each day.  Stretching and performing strength training exercises, such as yoga or weight lifting, at least 2 times per week.  Performing a total of at least 150 minutes of  moderate-intensity exercise each week, such as brisk walking or water aerobics.  Exercising at least 3 days per week, making sure you allow no more than 2 consecutive days to pass without exercising.  Avoiding long periods of inactivity (90 minutes or more). When you have to spend an extended period of time sitting down, take frequent breaks to walk or stretch. RECOMMENDATIONS FOR EXERCISING WITH TYPE 1 OR TYPE 2 DIABETES   Check your blood glucose before exercising. If blood glucose levels are greater than 240 mg/dL, check for urine ketones. Do not exercise if ketones are present.  Avoid injecting insulin into areas of the body that are going to be exercised. For example, avoid injecting insulin into:  The arms when playing tennis.  The legs when jogging.  Keep a record of:  Food intake before and after you exercise.  Expected peak times of insulin action.  Blood glucose levels before and after you exercise.  The type and amount of exercise you have done.  Review your records with your health care provider. Your health care provider will help you to develop guidelines for adjusting food intake and insulin amounts before and after exercising.  If you take insulin or oral hypoglycemic agents, watch  for signs and symptoms of hypoglycemia. They include:  Dizziness.  Shaking.  Sweating.  Chills.  Confusion.  Drink plenty of water while you exercise to prevent dehydration or heat stroke. Body water is lost during exercise and must be replaced.  Talk to your health care provider before starting an exercise program to make sure it is safe for you. Remember, almost any type of activity is better than none.   This information is not intended to replace advice given to you by your health care provider. Make sure you discuss any questions you have with your health care provider.   Document Released: 10/24/2003 Document Revised: 12/18/2014 Document Reviewed: 01/10/2013 Elsevier  Interactive Patient Education [employer].

## 2016-03-13 NOTE — Progress Notes (Signed)
Assessment and plan:  1. Type 2 diabetes mellitus without complication, without long-term current use of insulin ([place])   2. Pure hypercholesterolemia   3. Morbid obesity, unspecified obesity type ([place])   4. Essential hypertension, benign   5. Family history of cardiovascular disease      A1c is 8.3: No change in medicine the patient will continue his lifestyle modification of eating better and moving more.  HDL is low, as well as the rest of his bad cholesterol. We will cut his medicine in half. He will take 10 mg nightly. He knows is a must that he continue lifestyle and dietary changes.  Patient artery lost 4 pounds since last office visit. He knows goal is to continue to lose. Blood pressure much improved since last office visit. He understands as he continues to lose weight and be healthy that we'll continue to come down and maybe eventually we can cut his blood pressure meds and half    Patient's Medications  New Prescriptions   No medications on file  Previous Medications   ASPIRIN EC 81 MG TABLET    Take 81 mg by mouth daily.   CETIRIZINE (ZYRTEC) 10 MG TABLET    Take 10 mg by mouth daily as needed.   GLIPIZIDE (GLUCOTROL XL) 10 MG 24 HR TABLET    Take 1 tablet (10 mg total) by mouth daily.   IPRATROPIUM (ATROVENT) 0.03 % NASAL SPRAY    Place 2 sprays into the nose 2 (two) times daily.   LISINOPRIL (PRINIVIL,ZESTRIL) 40 MG TABLET    Take 1 tablet (40 mg total) by mouth daily.   OMEPRAZOLE (PRILOSEC) 20 MG CAPSULE    Take 1 capsule (20 mg total) by mouth daily.   PRESCRIPTION MEDICATION    Clindamycin 150 mg taking 4 tabs for dental work.   SAXAGLIPTIN-METFORMIN (KOMBIGLYZE XR) 2.12-998 MG TB24    Take 1 tablet by mouth 2 (two) times daily.   SIMVASTATIN (ZOCOR) 20 MG TABLET    Take 1 tablet (20 mg total) by mouth daily at 6 PM.   TADALAFIL (CIALIS) 20 MG TABLET    Take 1 tablet (20 mg total) by mouth daily as  needed.  Modified Medications   No medications on file  Discontinued Medications   No medications on file    Return in about 3 months (around 06/13/2016).  Anticipatory guidance and routine counseling done re: condition, txmnt options and need for follow up. All questions of patient's were answered.   Gross side effects, risk and benefits, and alternatives of medications discussed with patient.  Patient is aware that all medications have potential side effects and we are unable to predict every sideeffect or drug-drug interaction that may occur.  Expresses verbal understanding and consents to current therapy plan and treatment regiment.  Please see AVS handed out to patient at the end of our visit for additional patient instructions/ counseling done pertaining to today's office visit.  Note: This document was prepared using Dragon voice recognition software and may include unintentional dictation errors.   ----------------------------------------------------------------------------------------------------------------------  Subjective:   CC: Joseph Collier is a 65 y.o. male who presents to [place] at [facility] today for review of his recent lab work.  He has no complaints today.   Lost 4 lbs.  Triple intake of water, walking more, cut way back on carbs.    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 FBS- not checking them  much but when he does- they are better than prior.  Highest- 152 lowest 124. Is completely asymptomatic.  No snoring, holding breath at night etc.  patient has a rather thick neck and I asked him last office visit to just check and see his wife if he is snoring or holding breath or having any difficulty breathing at night and patient denies any of those symptoms. He feels rested in the mornings.Danley Danker Weights   03/13/16 0907  Weight: 247 lb 14.4 oz (112.4 kg)    BP Readings from Last 3 Encounters:  03/13/16 127/79  02/27/16 (!) 143/85    05/24/15 133/83   Full medical history updated and reviewed in the office today  Patient Active Problem List   Diagnosis Date Noted  . Personal history of colonic polyps 03/07/2016  . Allergic rhinitis 03/07/2016  . Morbidly obese ([place]) 03/07/2016  . History of tobacco abuse- 30+ pack year history quit 1991 03/07/2016  . Family history of cardiovascular disease 05/24/2015  . History of colonic polyps 05/24/2015  . Type 2 diabetes mellitus without complication, without long-term current use of insulin ([place]) 05/24/2015  . Erectile dysfunction 05/10/2014  . History of melanoma 02/05/2014  . GERD (gastroesophageal reflux disease) 02/05/2014  . Degenerative disc disease, cervical 10/30/2013  . Pure hypercholesterolemia 09/05/2012  . Essential hypertension, benign 09/05/2012  . History of surgical procedure 10/07/2011    Past Medical History:  Diagnosis Date  . Allergic rhinitis, cause unspecified   . Anemia, unspecified   . Arthritis   . Cancer ([place]) 08/17/2006   Melanoma  . Essential hypertension, benign   . Impotence of organic origin 04/14/2007  . Obesity, unspecified   . Other and unspecified hyperlipidemia   . Personal history of colonic polyps   . Skin lesion 04/14/2007   melanoma    Past Surgical History:  Procedure Laterality Date  . APPENDECTOMY    . arthroscopy of knee  2008   Left Knee  . COLONOSCOPY  08/18/2007   normal. [name].  Marland Kitchen HERNIA REPAIR     age 71  . JOINT REPLACEMENT    . melanoma of forearm resection  1992   Left        [name]  . TONSILLECTOMY AND ADENOIDECTOMY      Social History  Substance Use Topics  . Smoking status: Former Smoker    Packs/day: 1.00    Years: 25.00    Types: Cigarettes    Quit date: 08/17/1989  . Smokeless tobacco: Never Used     Comment: quit 1991  . Alcohol use 3.0 oz/week    5 Cans of beer per week     Comment: moderate: drinks hard liquor 7 drinks per month in 08/2011/4 drinks 4 a week    family history includes  Cancer (age of onset: 67) in his mother; Coronary artery disease in his father; Diabetes in his brother and father; Heart disease in his father; Hypertension in his brother and father.   Medications: Current Outpatient Prescriptions  Medication Sig Dispense Refill  . aspirin EC 81 MG tablet Take 81 mg by mouth daily.    . cetirizine (ZYRTEC) 10 MG tablet Take 10 mg by mouth daily as needed.    Marland Kitchen glipiZIDE (GLUCOTROL XL) 10 MG 24 hr tablet Take 1 tablet (10 mg total) by mouth daily. 90 tablet 1  . ipratropium (ATROVENT) 0.03 % nasal spray Place 2 sprays into the nose 2 (two) times daily. 30 mL 11  .  lisinopril (PRINIVIL,ZESTRIL) 40 MG tablet Take 1 tablet (40 mg total) by mouth daily. 90 tablet 1  . omeprazole (PRILOSEC) 20 MG capsule Take 1 capsule (20 mg total) by mouth daily. 90 capsule 1  . PRESCRIPTION MEDICATION Clindamycin 150 mg taking 4 tabs for dental work.    . Saxagliptin-Metformin (KOMBIGLYZE XR) 2.12-998 MG TB24 Take 1 tablet by mouth 2 (two) times daily. 90 tablet 1  . simvastatin (ZOCOR) 20 MG tablet Take 1 tablet (20 mg total) by mouth daily at 6 PM. 90 tablet 1  . tadalafil (CIALIS) 20 MG tablet Take 1 tablet (20 mg total) by mouth daily as needed. 10 tablet 11   No current facility-administered medications for this visit.     Allergies:  Allergies  Allergen Reactions  . Penicillins Hives and Swelling  . Tetracyclines & Related Hives and Swelling     ROS:  Const:    no fevers, chills Eyes:    conjunctiva clear, no vision changes or blurred vision ENT:  no hearing difficulties, no dysphagia, no dysphonia, no nose bleeds CV:   no chest pain, arrhythmias, no orthopnea, no PND Pulm:   no SOB at rest or exertion, no Wheeze, no DIB, no hemoptysis GI:    no N/V/D/C, no abd pain GU:   no blood in urine or inc freq or urgency Heme/Onc:    no unexplained bleeding, no night sweats, no more fatigue than usual Neuro:   No dizziness, no LOC, No unexplained weakness or  numbness Endo:   no unexplained wt loss or gain M-Sk:   no localized myalgias or arthralgias Psych:    No SI/HI, no memory prob or unexplained confusion    Objective:  Blood pressure 127/79, pulse 85, weight 247 lb 14.4 oz (112.4 kg).  Body mass index is 37.69 kg/m. Filed Weights   03/13/16 0907  Weight: 247 lb 14.4 oz (112.4 kg)   Gen: Well NAD, A and O *3 HEENT: [place]/AT, EOMI,  MMM, OP- clr, No carotid bruits bilaterally, no JVD, no masses  Lungs: Normal work of breathing. CTA B/L, no Wh, rhonchi Heart: RRR, S1, S2 WNL's, no MRG Abd: Soft. No gross distention Exts: warm, pink,  Brisk capillary refill, warm and well perfused.    Recent Results (from the past 2160 hour(s))  POCT glycosylated hemoglobin (Hb A1C)     Status: Abnormal   Collection Time: 02/27/16  3:21 PM  Result Value Ref Range   Hemoglobin A1C 8.3   CBC w/Diff     Status: None   Collection Time: 03/05/16  8:29 AM  Result Value Ref Range   WBC 8.5 3.8 - 10.8 K/uL   RBC 4.67 4.20 - 5.80 MIL/uL   Hemoglobin 13.8 13.2 - 17.1 g/dL   HCT 41.4 38.5 - 50.0 %   MCV 88.7 80.0 - 100.0 fL   MCH 29.6 27.0 - 33.0 pg   MCHC 33.3 32.0 - 36.0 g/dL   RDW 13.5 11.0 - 15.0 %   Platelets 302 140 - 400 K/uL   MPV 9.2 7.5 - 12.5 fL   Neutro Abs 5,100 1,500 - 7,800 cells/uL   Lymphs Abs 2,295 850 - 3,900 cells/uL   Monocytes Absolute 935 200 - 950 cells/uL   Eosinophils Absolute 170 15 - 500 cells/uL   Basophils Absolute 0 0 - 200 cells/uL   Neutrophils Relative % 60 %   Lymphocytes Relative 27 %   Monocytes Relative 11 %   Eosinophils Relative 2 %   Basophils  Relative 0 %   Smear Review Criteria for review not met     Comment: ** Please note change in unit of measure and reference range(s). **  COMPLETE METABOLIC PANEL WITH GFR     Status: Abnormal   Collection Time: 03/05/16  8:29 AM  Result Value Ref Range   Sodium 140 135 - 146 mmol/L   Potassium 4.6 3.5 - 5.3 mmol/L   Chloride 103 98 - 110 mmol/L   CO2 24 20 - 31  mmol/L   Glucose, Bld 144 (H) 65 - 99 mg/dL   BUN 13 7 - 25 mg/dL   Creat 0.93 0.70 - 1.25 mg/dL    Comment:   For patients > or = 63 years of age: The upper reference limit for Creatinine is approximately 13% higher for people identified as African-American.      Total Bilirubin 1.0 0.2 - 1.2 mg/dL   Alkaline Phosphatase 33 (L) 40 - 115 U/L   AST 25 10 - 35 U/L   ALT 39 9 - 46 U/L   Total Protein 6.9 6.1 - 8.1 g/dL   Albumin 4.6 3.6 - 5.1 g/dL   Calcium 9.6 8.6 - 10.3 mg/dL   GFR, Est African American >89 >=60 mL/min   GFR, Est Non African American 87 >=60 mL/min  TSH     Status: None   Collection Time: 03/05/16  8:29 AM  Result Value Ref Range   TSH 1.21 0.40 - 4.50 mIU/L  Lipid Profile     Status: Abnormal   Collection Time: 03/05/16  8:29 AM  Result Value Ref Range   Cholesterol 94 (L) 125 - 200 mg/dL   Triglycerides 98 <150 mg/dL   HDL 38 (L) >=40 mg/dL   Total CHOL/HDL Ratio 2.5 <=5.0 Ratio   VLDL 20 <30 mg/dL   LDL Cholesterol 36 <130 mg/dL    Comment:   Total Cholesterol/HDL Ratio:CHD Risk                        Coronary Heart Disease Risk Table                                        Men       Women          1/2 Average Risk              3.4        3.3              Average Risk              5.0        4.4           2X Average Risk              9.6        7.1           3X Average Risk             23.4       11.0 Use the calculated Patient Ratio above and the CHD Risk table  to determine the patient's CHD Risk.   Vitamin D (25 hydroxy)     Status: None   Collection Time: 03/05/16  8:29 AM  Result Value Ref Range   Vit D, 25-Hydroxy 38 30 - 100 ng/mL    Comment:  Vitamin D Status           25-OH Vitamin D        Deficiency                <20 ng/mL        Insufficiency         20 - 29 ng/mL        Optimal             > or = 30 ng/mL   For 25-OH Vitamin D testing on patients on D2-supplementation and patients for whom quantitation of D2 and D3 fractions is  required, the QuestAssureD 25-OH VIT D, (D2,D3), LC/MS/MS is recommended: order code [id number] (patients > 2 yrs).   B12     Status: None   Collection Time: 03/05/16  8:29 AM  Result Value Ref Range   Vitamin B-12 583 200 - 1,100 pg/mL

## 2016-03-17 ENCOUNTER — Ambulatory Visit: Payer: 59 | Admitting: Family Medicine

## 2016-05-05 LAB — HM DIABETES EYE EXAM

## 2016-06-15 ENCOUNTER — Encounter: Payer: Self-pay | Admitting: Family Medicine

## 2016-06-15 ENCOUNTER — Ambulatory Visit (INDEPENDENT_AMBULATORY_CARE_PROVIDER_SITE_OTHER): Payer: Commercial Managed Care - HMO | Admitting: Family Medicine

## 2016-06-15 VITALS — BP 147/97 | HR 84 | Ht 68.0 in | Wt 243.3 lb

## 2016-06-15 DIAGNOSIS — E119 Type 2 diabetes mellitus without complications: Secondary | ICD-10-CM

## 2016-06-15 DIAGNOSIS — Z23 Encounter for immunization: Secondary | ICD-10-CM | POA: Diagnosis not present

## 2016-06-15 DIAGNOSIS — Z1211 Encounter for screening for malignant neoplasm of colon: Secondary | ICD-10-CM

## 2016-06-15 DIAGNOSIS — Z0000 Encounter for general adult medical examination without abnormal findings: Secondary | ICD-10-CM | POA: Diagnosis not present

## 2016-06-15 DIAGNOSIS — I10 Essential (primary) hypertension: Secondary | ICD-10-CM

## 2016-06-15 LAB — POCT UA - MICROALBUMIN
Albumin/Creatinine Ratio, Urine, POC: <30
Creatinine, POC: 200 mg/dL
MICROALBUMIN (UR) POC: 30 mg/L

## 2016-06-15 LAB — POCT GLYCOSYLATED HEMOGLOBIN (HGB A1C): HEMOGLOBIN A1C: 7.5

## 2016-06-15 NOTE — Assessment & Plan Note (Signed)
Weight loss advised. Discussed risks associated with this with patient.

## 2016-06-15 NOTE — Assessment & Plan Note (Signed)
Blood pressure not well controlled-counseling done. Continue current medicines for now as patient says he has not been really good with his lifestyle habits.     -Check at home and keep blood pressure log. Follow up sooner than planned if remains above goal.

## 2016-06-15 NOTE — Assessment & Plan Note (Signed)
A1c 7.5 today. Continue current regimen patient will do better personally with prudent diet and walking. Urine micral albumin is negative today.

## 2016-06-15 NOTE — Patient Instructions (Signed)
Please call Dr. [name] office, your gastroenterologist to make sure you know exactly when you need to go to repeat your colonoscopy. Last one done '09 and showed a couple of polyps.

## 2016-06-15 NOTE — Progress Notes (Signed)
Male physical  Impression and Recommendations:    1. Encounter for wellness examination   2. Need for prophylactic vaccination and inoculation against influenza   3. Type 2 diabetes mellitus without complication, without long-term current use of insulin ([place])   4. Morbidly obese ([place])   5. Essential hypertension, benign-not at goal.    -Flu vaccine given today.   1) Anticipatory Guidance: Discussed importance of wearing a seatbelt while driving, not texting while driving;   sunscreen when outside along with skin surveillance; eating a balanced and modest diet; physical activity at least 25 minutes per day or 150 min/ week moderate to intense activity.  2) Immunizations / Screenings / Labs:  All immunizations are up-to-date per recommendations or will be updated today. Patient is up-to-date on dental and vision screens which pt will schedule independently.   3) Weight:  BMI meaning discussed with patient.  Discussed goal of losing 5-10% of current body weight which would improve overall feelings of well being and improve objective health data. Improve nutrient density of diet through increasing intake of fruits and vegetables and decreasing saturated fats, white flour products and refined sugars.    Orders Placed This Encounter  Procedures  . POCT glycosylated hemoglobin (Hb A1C)  . POCT UA - Microalbumin    Type 2 diabetes mellitus without complication, without long-term current use of insulin ([facility]) A1c 7.5 today. Continue current regimen patient will do better personally with prudent diet and walking. Urine micral albumin is negative today.   Morbidly obese ([facility]) Weight loss advised. Discussed risks associated with this with patient.  Essential hypertension, benign Blood pressure not well controlled-counseling done. Continue current medicines for now as patient says he has not been really good with his lifestyle habits.     -Check at home and keep blood pressure log.  Follow up sooner than planned if remains above goal.  Patient's Medications  New Prescriptions   No medications on file  Previous Medications   ASPIRIN EC 81 MG TABLET    Take 81 mg by mouth daily.   CETIRIZINE (ZYRTEC) 10 MG TABLET    Take 10 mg by mouth daily as needed.   GLIPIZIDE (GLUCOTROL XL) 10 MG 24 HR TABLET    Take 1 tablet (10 mg total) by mouth daily.   IPRATROPIUM (ATROVENT) 0.03 % NASAL SPRAY    Place 2 sprays into the nose 2 (two) times daily.   LISINOPRIL (PRINIVIL,ZESTRIL) 40 MG TABLET    Take 1 tablet (40 mg total) by mouth daily.   OMEPRAZOLE (PRILOSEC) 20 MG CAPSULE    Take 1 capsule (20 mg total) by mouth daily.   PRESCRIPTION MEDICATION    Clindamycin 150 mg taking 4 tabs for dental work.   SAXAGLIPTIN-METFORMIN (KOMBIGLYZE XR) 2.12-998 MG TB24    Take 1 tablet by mouth 2 (two) times daily.   SIMVASTATIN (ZOCOR) 20 MG TABLET    1/2 tab every evening after dinner   TADALAFIL (CIALIS) 20 MG TABLET    Take 1 tablet (20 mg total) by mouth daily as needed.  Modified Medications   No medications on file  Discontinued Medications   No medications on file    Please see AVS handed out to patient at the end of our visit for further patient instructions/ counseling done pertaining to today's office visit.  Gross side effects, risk and benefits, and alternatives of medications discussed with patient.  Patient is aware that all medications have potential side effects and we are  unable to predict every side effect or drug-drug interaction that may occur.  Expresses verbal understanding and consents to current therapy plan and treatment regimen.  Follow-up preventative CPE in 1 year. Follow-up office visit pending lab work.  F/up sooner for chronic care management and/or prn    Subjective:    CC: CPE  HPI: Joseph Collier is a 65 y.o. male who presents to [place] at [facility] today for a yearly health maintenance exam.    Health Maintenance Summary  Reviewed and updated, unless pt declines services.  PHQ-2 :  Negative ETOH: rarely drinks Colonoscopy: '09 - He will call Dr. [name], the gastroenterologist office to see if he needs it repeated in 5 or 10 years since they found a couple of polyps.  Tobacco History Reviewed: quit 1991; less than 30pk yr hx Alcohol: No concerns, no excessive use Exercise Habits: walk some STD concerns: none; monogamous with wife only. Drug Use: None Birth control method: n/a Testicular/penile concerns: no concerns; no nocturia Cancer Family History: mom- lung CA- was smoker.   Obese:  Patient has lost 8 pounds since mid July.   Encouraged to continue    Dermatologist--- on [place]- done yrly.  [name]- GI doc- colonsocpy- '09- thinks he was told 10 year f/up Optometry/ optho- DM eye exams-  UTD   Wt Readings from Last 3 Encounters:  06/15/16 243 lb 4.8 oz (110.4 kg)  03/13/16 247 lb 14.4 oz (112.4 kg)  02/27/16 251 lb 4.8 oz (114 kg)   BP Readings from Last 3 Encounters:  06/15/16 (!) 147/97  03/13/16 127/79  02/27/16 (!) 143/85   Pulse Readings from Last 3 Encounters:  06/15/16 84  03/13/16 85  02/27/16 80    Patient Active Problem List   Diagnosis Date Noted  . Personal history of colonic polyps 03/07/2016  . Allergic rhinitis 03/07/2016  . Morbidly obese ([place]) 03/07/2016  . History of tobacco abuse- less than 30 pack year history quit 1991 03/07/2016  . Family history of cardiovascular disease 05/24/2015  . History of colonic polyps 05/24/2015  . Type 2 diabetes mellitus without complication, without long-term current use of insulin ([place]) 05/24/2015  . Erectile dysfunction 05/10/2014  . History of melanoma 02/05/2014  . GERD (gastroesophageal reflux disease) 02/05/2014  . Degenerative disc disease, cervical 10/30/2013  . Pure hypercholesterolemia 09/05/2012  . Essential hypertension, benign 09/05/2012  . History of surgical procedure 10/07/2011    Past Medical History:    Diagnosis Date  . Allergic rhinitis, cause unspecified   . Anemia, unspecified   . Arthritis   . Cancer ([place]) 08/17/2006   Melanoma  . Essential hypertension, benign   . Impotence of organic origin 04/14/2007  . Obesity, unspecified   . Other and unspecified hyperlipidemia   . Personal history of colonic polyps   . Skin lesion 04/14/2007   melanoma    Past Surgical History:  Procedure Laterality Date  . APPENDECTOMY    . arthroscopy of knee  2008   Left Knee  . COLONOSCOPY  08/18/2007   normal. [name].  Marland Kitchen HERNIA REPAIR     age 31  . JOINT REPLACEMENT    . melanoma of forearm resection  1992   Left        [name]  . TONSILLECTOMY AND ADENOIDECTOMY      Family History  Problem Relation Age of Onset  . Coronary artery disease Father   . Hypertension Father   . Heart disease Father  AMI cause of death  . Diabetes Father   . Cancer Mother 33    lung  . Hypertension Brother   . Diabetes Brother     History  Drug Use No  ,  History  Alcohol Use  . 3.0 oz/week  . 5 Cans of beer per week    Comment: moderate: drinks hard liquor 7 drinks per month in 08/2011/4 drinks 4 a week  ,  History  Smoking Status  . Former Smoker  . Packs/day: 1.00  . Years: 25.00  . Types: Cigarettes  . Quit date: 08/17/1989  Smokeless Tobacco  . Never Used    Comment: quit 1991  ,  History  Sexual Activity  . Sexual activity: Yes  . Birth control/ protection: Post-menopausal    Patient's Medications  New Prescriptions   No medications on file  Previous Medications   ASPIRIN EC 81 MG TABLET    Take 81 mg by mouth daily.   CETIRIZINE (ZYRTEC) 10 MG TABLET    Take 10 mg by mouth daily as needed.   GLIPIZIDE (GLUCOTROL XL) 10 MG 24 HR TABLET    Take 1 tablet (10 mg total) by mouth daily.   IPRATROPIUM (ATROVENT) 0.03 % NASAL SPRAY    Place 2 sprays into the nose 2 (two) times daily.   LISINOPRIL (PRINIVIL,ZESTRIL) 40 MG TABLET    Take 1 tablet (40 mg total) by mouth daily.    OMEPRAZOLE (PRILOSEC) 20 MG CAPSULE    Take 1 capsule (20 mg total) by mouth daily.   PRESCRIPTION MEDICATION    Clindamycin 150 mg taking 4 tabs for dental work.   SAXAGLIPTIN-METFORMIN (KOMBIGLYZE XR) 2.12-998 MG TB24    Take 1 tablet by mouth 2 (two) times daily.   SIMVASTATIN (ZOCOR) 20 MG TABLET    1/2 tab every evening after dinner   TADALAFIL (CIALIS) 20 MG TABLET    Take 1 tablet (20 mg total) by mouth daily as needed.  Modified Medications   No medications on file  Discontinued Medications   No medications on file    Penicillins and Tetracyclines & related  Review of Systems  Constitutional: Negative.  Negative for chills, diaphoresis, fever, malaise/fatigue and weight loss.  HENT: Negative.  Negative for congestion, sore throat and tinnitus.   Eyes: Negative.  Negative for blurred vision, double vision and photophobia.  Respiratory: Negative.  Negative for cough and wheezing.   Cardiovascular: Negative.  Negative for chest pain and palpitations.  Gastrointestinal: Negative.  Negative for blood in stool, diarrhea, nausea and vomiting.  Genitourinary: Negative.  Negative for dysuria, frequency and urgency.  Musculoskeletal: Negative.  Negative for joint pain and myalgias.  Skin: Negative.  Negative for itching and rash.  Neurological: Negative.  Negative for dizziness, focal weakness, weakness and headaches.  Endo/Heme/Allergies: Negative.  Negative for environmental allergies and polydipsia. Does not bruise/bleed easily.  Psychiatric/Behavioral: Negative.  Negative for depression and memory loss. The patient is not nervous/anxious and does not have insomnia.      Objective:     Blood pressure (!) 147/97, pulse 84, height 5\' 8"  (1.727 m), weight 243 lb 4.8 oz (110.4 kg). Body mass index is 36.99 kg/m. General Appearance:    Alert, cooperative, no distress, appears stated age  Head:    Normocephalic, without obvious abnormality, atraumatic  Eyes:    PERRL,  conjunctiva/corneas clear, EOM's intact, fundi    benign, both eyes  Ears:    Normal TM's and external ear canals, both ears  Nose:   Nares normal, septum midline, mucosa normal, no drainage    or sinus tenderness  Throat:   Lips w/o lesion, mucosa moist, and tongue normal; teeth and   gums normal  Neck:   Supple, symmetrical, trachea midline, no adenopathy;    thyroid:  no enlargement/tenderness/nodules; no carotid   bruit or JVD  Back:     Symmetric, no curvature, ROM normal, no CVA tenderness  Lungs:     Clear to auscultation bilaterally, respirations unlabored, no       Wh/ R/ R  Chest Wall:    No tenderness or gross deformity; normal excursion   Heart:    Regular rate and rhythm, S1 and S2 normal, no murmur, rub   or gallop  Abdomen:     Soft, non-tender, bowel sounds active all four quadrants, NO   G/R/R, no masses, no organomegaly  Genitalia:    Ext genitalia: without lesion, no penile rash or discharge, no hernias appreciated   Rectal:    Normal tone, prostate WNL's and equal b/l, no tenderness; guaiac negative stool  Extremities:   Extremities normal, atraumatic, no cyanosis or gross edema  Pulses:   2+ and symmetric all extremities  Skin:   Warm, dry, Skin color, texture, turgor normal, no obvious rashes or lesions  M-Sk:   Ambulates * 4 w/o difficulty, no gross deformities, tone WNL  Neurologic:   CNII-XII intact, normal strength, sensation and reflexes    Throughout Psych:  No HI/SI, judgement and insight good, Euthymic mood. Full Affect.

## 2016-06-16 LAB — POC HEMOCCULT BLD/STL (OFFICE/1-CARD/DIAGNOSTIC): FECAL OCCULT BLD: NEGATIVE

## 2016-06-16 NOTE — Addendum Note (Signed)
Addended by: Fonnie Mu on: 06/16/2016 09:30 AM   Modules accepted: Orders

## 2016-06-19 ENCOUNTER — Other Ambulatory Visit: Payer: Self-pay | Admitting: Family Medicine

## 2016-08-16 ENCOUNTER — Other Ambulatory Visit: Payer: Self-pay | Admitting: Family Medicine

## 2016-08-16 DIAGNOSIS — K219 Gastro-esophageal reflux disease without esophagitis: Secondary | ICD-10-CM

## 2016-08-16 DIAGNOSIS — E119 Type 2 diabetes mellitus without complications: Secondary | ICD-10-CM

## 2016-08-16 DIAGNOSIS — E7800 Pure hypercholesterolemia, unspecified: Secondary | ICD-10-CM

## 2016-09-17 ENCOUNTER — Other Ambulatory Visit: Payer: Self-pay | Admitting: Family Medicine

## 2016-09-17 ENCOUNTER — Ambulatory Visit: Payer: Commercial Managed Care - HMO | Admitting: Family Medicine

## 2016-10-21 ENCOUNTER — Ambulatory Visit: Payer: Commercial Managed Care - HMO | Admitting: Family Medicine

## 2016-12-01 ENCOUNTER — Ambulatory Visit: Payer: Commercial Managed Care - HMO | Admitting: Family Medicine

## 2016-12-16 ENCOUNTER — Other Ambulatory Visit: Payer: Self-pay | Admitting: Family Medicine

## 2016-12-16 ENCOUNTER — Other Ambulatory Visit: Payer: Self-pay

## 2016-12-16 NOTE — Telephone Encounter (Signed)
Received refill request for Komiglyze.  Refill authorized for 30 days only since he has not been seen since 05/2016.  Pt has appt 12/23/16.  Charyl Bigger, CMA

## 2016-12-23 ENCOUNTER — Ambulatory Visit (INDEPENDENT_AMBULATORY_CARE_PROVIDER_SITE_OTHER): Payer: Commercial Managed Care - HMO | Admitting: Family Medicine

## 2016-12-23 ENCOUNTER — Encounter: Payer: Self-pay | Admitting: Family Medicine

## 2016-12-23 VITALS — BP 125/81 | HR 94 | Ht 68.75 in | Wt 248.0 lb

## 2016-12-23 DIAGNOSIS — E7800 Pure hypercholesterolemia, unspecified: Secondary | ICD-10-CM | POA: Diagnosis not present

## 2016-12-23 DIAGNOSIS — Z723 Lack of physical exercise: Secondary | ICD-10-CM | POA: Insufficient documentation

## 2016-12-23 DIAGNOSIS — I10 Essential (primary) hypertension: Secondary | ICD-10-CM | POA: Diagnosis not present

## 2016-12-23 DIAGNOSIS — E119 Type 2 diabetes mellitus without complications: Secondary | ICD-10-CM

## 2016-12-23 DIAGNOSIS — Z87891 Personal history of nicotine dependence: Secondary | ICD-10-CM

## 2016-12-23 LAB — POCT GLYCOSYLATED HEMOGLOBIN (HGB A1C): HEMOGLOBIN A1C: 9.9

## 2016-12-23 MED ORDER — INSULIN GLARGINE 100 UNIT/ML SOLOSTAR PEN: 25.0000 [IU] | PEN_INJECTOR | Freq: Every day | SUBCUTANEOUS | 2 refills | Status: DC

## 2016-12-23 NOTE — Patient Instructions (Signed)
See handouts I gave you on lantus  Check BS and write it down- bring in next OV in 1 mo    Insulin Glargine injection What is this medicine? INSULIN GLARGINE (IN su lin GLAR geen) is a human-made form of insulin. This drug lowers the amount of sugar in your blood. It is a long-acting insulin that is usually given once a day. This medicine may be used for other purposes; ask your health care provider or pharmacist if you have questions. COMMON BRAND NAME(S): BASAGLAR, Lantus, Lantus SoloStar, Toujeo SoloStar What should I tell my health care provider before I take this medicine? They need to know if you have any of these conditions: -episodes of low blood sugar -kidney disease -liver disease -an unusual or allergic reaction to insulin, metacresol, other medicines, foods, dyes, or preservatives -pregnant or trying to get pregnant -breast-feeding How should I use this medicine? This medicine is for injection under the skin. Use this medicine at the same time each day. Use exactly as directed. This insulin should never be mixed in the same syringe with other insulins before injection. Do not vigorously shake before use. You will be taught how to use this medicine and how to adjust doses for activities and illness. Do not use more insulin than prescribed. Always check the appearance of your insulin before using it. This medicine should be clear and colorless like water. Do not use it if it is cloudy, thickened, colored, or has solid particles in it. It is important that you put your used needles and syringes in a special sharps container. Do not put them in a trash can. If you do not have a sharps container, call your pharmacist or healthcare provider to get one. Talk to your pediatrician regarding the use of this medicine in children. Special care may be needed. Overdosage: If you think you have taken too much of this medicine contact a poison control center or emergency room at once. NOTE: This  medicine is only for you. Do not share this medicine with others. What if I miss a dose? It is important not to miss a dose. Your health care professional or doctor should discuss a plan for missed doses with you. If you do miss a dose, follow their plan. Do not take double doses. What may interact with this medicine? -other medicines for diabetes Many medications may cause changes in blood sugar, these include: -alcohol containing beverages -antiviral medicines for HIV or AIDS -aspirin and aspirin-like drugs -certain medicines for blood pressure, heart disease, irregular heart beat -chromium -diuretics -male hormones, such as estrogens or progestins, birth control pills -fenofibrate -gemfibrozil -isoniazid -lanreotide -male hormones or anabolic steroids -MAOIs like Carbex, Eldepryl, Marplan, Nardil, and Parnate -medicines for weight loss -medicines for allergies, asthma, cold, or cough -medicines for depression, anxiety, or psychotic disturbances -niacin -nicotine -NSAIDs, medicines for pain and inflammation, like ibuprofen or naproxen -octreotide -pasireotide -pentamidine -phenytoin -probenecid -quinolone antibiotics such as ciprofloxacin, levofloxacin, ofloxacin -some herbal dietary supplements -steroid medicines such as prednisone or cortisone -sulfamethoxazole; trimethoprim -thyroid hormones Some medications can hide the warning symptoms of low blood sugar (hypoglycemia). You may need to monitor your blood sugar more closely if you are taking one of these medications. These include: -beta-blockers, often used for high blood pressure or heart problems (examples include atenolol, metoprolol, propranolol) -clonidine -guanethidine -reserpine This list may not describe all possible interactions. Give your health care provider a list of all the medicines, herbs, non-prescription drugs, or dietary supplements you use.  Also tell them if you smoke, drink alcohol, or use illegal  drugs. Some items may interact with your medicine. What should I watch for while using this medicine? Visit your health care professional or doctor for regular checks on your progress. Do not drive, use machinery, or do anything that needs mental alertness until you know how this medicine affects you. Alcohol may interfere with the effect of this medicine. Avoid alcoholic drinks. A test called the HbA1C (A1C) will be monitored. This is a simple blood test. It measures your blood sugar control over the last 2 to 3 months. You will receive this test every 3 to 6 months. Learn how to check your blood sugar. Learn the symptoms of low and high blood sugar and how to manage them. Always carry a quick-source of sugar with you in case you have symptoms of low blood sugar. Examples include hard sugar candy or glucose tablets. Make sure others know that you can choke if you eat or drink when you develop serious symptoms of low blood sugar, such as seizures or unconsciousness. They must get medical help at once. Tell your doctor or health care professional if you have high blood sugar. You might need to change the dose of your medicine. If you are sick or exercising more than usual, you might need to change the dose of your medicine. Do not skip meals. Ask your doctor or health care professional if you should avoid alcohol. Many nonprescription cough and cold products contain sugar or alcohol. These can affect blood sugar. Make sure that you have the right kind of syringe for the type of insulin you use. Try not to change the brand and type of insulin or syringe unless your health care professional or doctor tells you to. Switching insulin brand or type can cause dangerously high or low blood sugar. Always keep an extra supply of insulin, syringes, and needles on hand. Use a syringe one time only. Throw away syringe and needle in a closed container to prevent accidental needle sticks. Insulin pens and cartridges  should never be shared. Even if the needle is changed, sharing may result in passing of viruses like hepatitis or HIV. Wear a medical ID bracelet or chain, and carry a card that describes your disease and details of your medicine and dosage times. What side effects may I notice from receiving this medicine? Side effects that you should report to your doctor or health care professional as soon as possible: -allergic reactions like skin rash, itching or hives, swelling of the face, lips, or tongue -breathing problems -signs and symptoms of high blood sugar such as dizziness, dry mouth, dry skin, fruity breath, nausea, stomach pain, increased hunger or thirst, increased urination -signs and symptoms of low blood sugar such as feeling anxious, confusion, dizziness, increased hunger, unusually weak or tired, sweating, shakiness, cold, irritable, headache, blurred vision, fast heartbeat, loss of consciousness Side effects that usually do not require medical attention (report to your doctor or health care professional if they continue or are bothersome): -increase or decrease in fatty tissue under the skin due to overuse of a particular injection site -itching, burning, swelling, or rash at site where injected This list may not describe all possible side effects. Call your doctor for medical advice about side effects. You may report side effects to FDA at 1-800-FDA-1088. Where should I keep my medicine? Keep out of the reach of children. Store unopened vials in a refrigerator between 2 and 8 degrees C (36  and 46 degrees F). Do not freeze or use if the insulin has been frozen. Opened vials (vials currently in use) may be stored in the refrigerator or at room temperature, at approximately 25 degrees C (77 degrees F) or cooler. Keeping your insulin at room temperature decreases the amount of pain during injection. Once opened, your insulin can be used for 28 days. After 28 days, the vial should be thrown  away. Store Lantus [employer], mining in a refrigerator between 2 and 8 degrees C (36 and 46 degrees F) or at room temperature below 30 degrees C (86 degrees F). Do not freeze or use if the insulin has been frozen. Once opened, the pens should be kept at room temperature. Do not store in the refrigerator once opened. Once opened, the insulin can be used for 28 days. After 28 days, the Lantus Solostar Pen or Basaglar KwikPen should be thrown away. Store [employer] in a refrigerator between 2 and 8 degrees C (36 and 46 degrees F). Do not freeze or use if the insulin has been frozen. Once opened, the pens should be kept at room temperature below 30 degrees C (86 degrees F). Do not store in the refrigerator once opened. Once opened, the insulin can be used for 42 days. After 42 days, the [employer] Pen should be thrown away. Protect from light and excessive heat. Throw away any unused medicine after the expiration date or after the specified time for room temperature storage has passed. NOTE: This sheet is a summary. It may not cover all possible information. If you have questions about this medicine, talk to your doctor, pharmacist, or health care provider.  2018 [employer]vier/Gold Standard (2016-08-19 10:26:25) e handout on lantus guide I gave you  -

## 2016-12-23 NOTE — Progress Notes (Signed)
Assessment and Plan:  1. Type 2 diabetes mellitus without complication, without long-term current use of insulin ([place])   2. Pure hypercholesterolemia   3. Essential hypertension, benign   4. History of tobacco abuse- less than 30 pack year history quit 1991   5. Physically inactive    ------>  See handouts I gave you on lantus  Check BS and write it down- bring in next OV in 1 mo   The patient was counseled, risk factors were discussed, anticipatory guidance given.  - Hypertension:  Treatment regimen- see med orders from today for any changes  Importance of ambulatory blood pressure monitoring d/c pt- bring in log next OV DASH diet.  Routine exercise- 150 min /wk   - Cholesterol:  Counseling diet and exercise.  Cont meds Check cholesterol +/- CMET q 76mo    - Diabetes:    Poorly controlled at 9.9 today- detriments to health d/c pt - long d/c pt re: use of insulin and r/b meds - start lantus in addition to other meds ( pt wants prefilled pens)  - d/c pt about importance of monitoring - prudent diet and exercise - declines referral for DM education and/or nutritional referral   - Weight Mgt: Explained to patient what BMI refers to, and what it means medically.   Told patient to think about it as a "medical risk stratification measurement" and how increasing BMI is associated with increasing risk/ or worsening state of various diseases such as hypertension, hyperlipidemia, diabetes, premature OA, depression etc.  American Heart Association guidelines for healthy diet, basically Mediterranean diet, and exercise guidelines of 30 minutes 5 days per week or more discussed in detail.   -Reminded patient the need for yearly complete physical exam office visits in addition to office visits for management of the chronic diseases   Orders Placed This Encounter  Procedures  . POCT HgB A1C      New Prescriptions   INSULIN GLARGINE (LANTUS SOLOSTAR) 100 UNIT/ML  SOLOSTAR PEN    Inject 25 Units into the skin at bedtime. Please include QS 3 months.   INSULIN PEN NEEDLE (PEN NEEDLES) 30G X 8 MM MISC    Inject 1 pen into the skin daily.     Modified Medications   No medications on file     Discontinued Medications   SIMVASTATIN (ZOCOR) 20 MG TABLET    1/2 tab every evening after dinner  -Gross side effects, risk and benefits, and alternatives of medications discussed with patient.  Patient is aware that all medications have potential side effects and we are unable to predict every side effect or drug-drug interaction that may occur.  Expresses verbal understanding and consents to current therapy plan and treatment regimen.    Follow Up:  Return in about 4 weeks (around 01/20/2017) for since starting insulin; will need FLP, CMP, A1c in 40mo.   Please see AVS handed out to patient at the end of our visit for further patient instructions/ counseling done pertaining to today's office visit.    Note: This document was prepared using Dragon voice recognition software and may include unintentional dictation errors.  [name] 10:16 PM ----------------------------------------------------------------------------------------------------------------------     Subjective:  HPI: Sachin Ferencz Effinger 66 y.o. male  presents for 3 month follow up for multiple medical problems.   HPI:  SINAI MAHANY y.o. male presents for 3 month follow up for multiple medical problems.   1) Diabetes  Pt reports poor compliance with  medications and/ or treatment plan- no prudent diet, no exercise and pt disappointed in himself Denies S-E  Home fasting glucose readings range -   135 on average per pt last time he checked them Denies polyuria/polydipsia. Denies hypoglycemia symptoms  Last diabetic eye exam was  Lab Results  Component Value Date   HMDIABEYEEXA No Retinopathy 05/05/2016   Foot exam-  UTD  Lab Results  Component Value Date   HGBA1C 9.9  12/23/2016   HGBA1C 7.5 06/15/2016   HGBA1C 8.3 02/27/2016    Lab Results  Component Value Date   MICROALBUR 30 06/15/2016   LDLCALC 36 03/05/2016   CREATININE 0.93 03/05/2016        2) Hyperlipidemia  Pt reports compliance with medications and/ or treatment plan Denies S-E  RUQ pain-   no  Muscle aches-   no Patient reports poor compliance with low fat/low cholesterol diet and healthier lifestyle  modifications.  NO exercise.   Last lipid panel as follows:  Lab Results  Component Value Date   CHOL 94 (L) 03/05/2016   HDL 38 (L) 03/05/2016   LDLCALC 36 03/05/2016   TRIG 98 03/05/2016   CHOLHDL 2.5 03/05/2016         3) Hypertension Pt reports compliance with medications and/ or treatment plan Denies S-E. Home Blood pressure range-  Not checking  Low salt diet-    none  Exercise-    none Smoking Status noted  Patient denies new onset of sx- no chest pain, dizziness, HA, DIB/ shortness of breath  or swelling. Lab Results  Component Value Date   CREATININE 0.93 03/05/2016    Last 3 blood pressure readings in our office are as follows: BP Readings from Last 3 Encounters:  12/23/16 125/81  06/15/16 (!) 147/97  03/13/16 127/79       4) Weight: Up from prior Does not monitor it at home  Wt Readings from Last 3 Encounters:  12/23/16 248 lb (112.5 kg)  06/15/16 243 lb 4.8 oz (110.4 kg)  03/13/16 247 lb 14.4 oz (112.4 kg)   BMI Readings from Last 3 Encounters:  12/23/16 36.89 kg/m  06/15/16 36.99 kg/m  03/13/16 37.69 kg/m      Patient Care Team    Relationship Specialty Notifications Start End  [name], DO PCP - General Family Medicine  02/27/16      Review of Systems: General:   Denies fever, chills, unexplained weight loss.  Optho/Auditory:   Denies visual changes, blurred vision/LOV Respiratory:   Denies SOB, DOE more than baseline levels.  Cardiovascular:   Denies chest pain, palpitations, new onset peripheral edema    Gastrointestinal:   Denies nausea, vomiting, diarrhea.  Genitourinary: Denies dysuria, freq/ urgency, flank pain or discharge from genitals.  Endocrine:     Denies hot or cold intolerance, polyuria, polydipsia. Musculoskeletal:   Denies unexplained myalgias, joint swelling, unexplained arthralgias, gait problems.  Skin:  Denies rash, suspicious lesions Neurological:     Denies dizziness, unexplained weakness, numbness  Psychiatric/Behavioral:   Denies mood changes, suicidal or homicidal ideations, hallucinations    Objective: Physical Exam: BP 125/81   Pulse 94   Ht 5' 8.75" (1.746 m)   Wt 248 lb (112.5 kg)   SpO2 99%   BMI 36.89 kg/m  Body mass index is 36.89 kg/m. General: Well nourished, in no apparent distress. Eyes: PERRLA, EOMs, conjunctiva clr no swelling or erythema ENT/Mouth: Hearing appears normal.  Mucus Membranes Moist  Neck: Supple, no masses, no carotid bruits  b/l Resp: Respiratory effort- normal, ECTA B/L w/o W/R/R  Cardio: RRR w/o MRGs Lymphatics:  Brisk peripheral pulses, less 2 sec cap RF, no gross edema  M-sk:  normal gait.  Skin: Warm, dry without rashes.  Neuro: Alert, Oriented Psych: Normal affect, Insight and Judgment appropriate.    Current Medications:  Current Outpatient Prescriptions on File Prior to Visit  Medication Sig Dispense Refill  . aspirin EC 81 MG tablet Take 81 mg by mouth daily.    . cetirizine (ZYRTEC) 10 MG tablet Take 10 mg by mouth daily as needed.    Marland Kitchen glipiZIDE (GLUCOTROL XL) 10 MG 24 hr tablet TAKE 1 TABLET BY MOUTH DAILY. 90 tablet 1  . ipratropium (ATROVENT) 0.03 % nasal spray Place 2 sprays into the nose 2 (two) times daily. 30 mL 11  . KOMBIGLYZE XR 2.12-998 MG TB24 TAKE 1 TABLET BY MOUTH TWICE A DAY 60 tablet 0  . lisinopril (PRINIVIL,ZESTRIL) 40 MG tablet TAKE 1 TABLET BY MOUTH DAILY 90 tablet 1  . omeprazole (PRILOSEC) 20 MG capsule TAKE ONE CAPSULE BY MOUTH EVERY DAY 90 capsule 1  . PRESCRIPTION MEDICATION Clindamycin  150 mg taking 4 tabs for dental work.    . simvastatin (ZOCOR) 20 MG tablet TAKE 1 TABLET BY MOUTH DAILY AT 6 PM. 90 tablet 1  . tadalafil (CIALIS) 20 MG tablet Take 1 tablet (20 mg total) by mouth daily as needed. 10 tablet 11   No current facility-administered medications on file prior to visit.     Medical History:  Patient Active Problem List   Diagnosis Date Noted  . Family history of cardiovascular disease 05/24/2015    Priority: High  . Type 2 diabetes mellitus without complication, without long-term current use of insulin ([place]) 05/24/2015    Priority: High  . Pure hypercholesterolemia 09/05/2012    Priority: High  . Essential hypertension, benign 09/05/2012    Priority: High  . Morbidly obese ([place]) 03/07/2016    Priority: Medium  . Physically inactive 12/23/2016    Priority: Low  . History of tobacco abuse- less than 30 pack year history quit 1991 03/07/2016    Priority: Low  . Personal history of colonic polyps 03/07/2016  . Allergic rhinitis 03/07/2016  . History of colonic polyps 05/24/2015  . Erectile dysfunction 05/10/2014  . History of melanoma 02/05/2014  . GERD (gastroesophageal reflux disease) 02/05/2014  . Degenerative disc disease, cervical 10/30/2013  . History of surgical procedure 10/07/2011    Allergies:  Allergies  Allergen Reactions  . Penicillins Hives and Swelling  . Tetracyclines & Related Hives and Swelling     Family history-  Reviewed; changed as appropriate  Social history-  Reviewed; changed as appropriate

## 2016-12-24 ENCOUNTER — Other Ambulatory Visit: Payer: Self-pay

## 2016-12-24 DIAGNOSIS — E119 Type 2 diabetes mellitus without complications: Secondary | ICD-10-CM

## 2016-12-24 MED ORDER — PEN NEEDLES 30G X 8 MM MISC: 1.0000 "pen " | Freq: Every day | 1 refills | Status: DC

## 2016-12-24 NOTE — Progress Notes (Signed)
Pt came by the office and states that he needed an RX for pen needles for the Lantus pens.  RX sent to pharmacy.  Charyl Bigger, CMA

## 2017-01-20 ENCOUNTER — Other Ambulatory Visit: Payer: Self-pay | Admitting: Family Medicine

## 2017-01-20 ENCOUNTER — Ambulatory Visit (INDEPENDENT_AMBULATORY_CARE_PROVIDER_SITE_OTHER): Payer: Commercial Managed Care - HMO | Admitting: Family Medicine

## 2017-01-20 ENCOUNTER — Encounter: Payer: Self-pay | Admitting: Family Medicine

## 2017-01-20 VITALS — BP 125/76 | HR 76 | Ht 68.75 in | Wt 248.0 lb

## 2017-01-20 DIAGNOSIS — Z7984 Long term (current) use of oral hypoglycemic drugs: Secondary | ICD-10-CM

## 2017-01-20 DIAGNOSIS — E119 Type 2 diabetes mellitus without complications: Secondary | ICD-10-CM

## 2017-01-20 MED ORDER — INSULIN GLARGINE 100 UNIT/ML SOLOSTAR PEN: 30.0000 [IU] | PEN_INJECTOR | Freq: Every day | SUBCUTANEOUS | 1 refills | Status: DC

## 2017-01-20 NOTE — Patient Instructions (Signed)
FBS- goal is 120-140; 2 hr after lrgest meal is less than 180      Blood Glucose Monitoring, Adult Monitoring your blood sugar (glucose) helps you manage your diabetes. It also helps you and your health care provider determine how well your diabetes management plan is working. Blood glucose monitoring involves checking your blood glucose as often as directed, and keeping a record (log) of your results over time. Why should I monitor my blood glucose? Checking your blood glucose regularly can:  Help you understand how food, exercise, illnesses, and medicines affect your blood glucose.  Let you know what your blood glucose is at any time. You can quickly tell if you are having low blood glucose (hypoglycemia) or high blood glucose (hyperglycemia).  Help you and your health care provider adjust your medicines as needed.  When should I check my blood glucose? Follow instructions from your health care provider about how often to check your blood glucose. This may depend on:  The type of diabetes you have.  How well-controlled your diabetes is.  Medicines you are taking.  If you have type 1 diabetes:  Check your blood glucose at least 2 times a day.  Also check your blood glucose: ? Before every insulin injection. ? Before and after exercise. ? Between meals. ? 2 hours after a meal. ? Occasionally between 2:00 a.m. and 3:00 a.m., as directed. ? Before potentially dangerous tasks, like driving or using heavy machinery. ? At bedtime.  You may need to check your blood glucose more often, up to 6-10 times a day: ? If you use an insulin pump. ? If you need multiple daily injections (MDI). ? If your diabetes is not well-controlled. ? If you are ill. ? If you have a history of severe hypoglycemia. ? If you have a history of not knowing when your blood glucose is getting low (hypoglycemia unawareness). If you have type 2 diabetes:  If you take insulin or other diabetes  medicines, check your blood glucose at least 2 times a day.  If you are on intensive insulin therapy, check your blood glucose at least 4 times a day. Occasionally, you may also need to check between 2:00 a.m. and 3:00 a.m., as directed.  Also check your blood glucose: ? Before and after exercise. ? Before potentially dangerous tasks, like driving or using heavy machinery.  You may need to check your blood glucose more often if: ? Your medicine is being adjusted. ? Your diabetes is not well-controlled. ? You are ill. What is a blood glucose log?  A blood glucose log is a record of your blood glucose readings. It helps you and your health care provider: ? Look for patterns in your blood glucose over time. ? Adjust your diabetes management plan as needed.  Every time you check your blood glucose, write down your result and notes about things that may be affecting your blood glucose, such as your diet and exercise for the day.  Most glucose meters store a record of glucose readings in the meter. Some meters allow you to download your records to a computer. How do I check my blood glucose? Follow these steps to get accurate readings of your blood glucose: Supplies needed   Blood glucose meter.  Test strips for your meter. Each meter has its own strips. You must use the strips that come with your meter.  A needle to prick your finger (lancet). Do not use lancets more than once.  A device  that holds the lancet (lancing device).  A journal or log book to write down your results. Procedure  Wash your hands with soap and water.  Prick the side of your finger (not the tip) with the lancet. Use a different finger each time.  Gently rub the finger until a small drop of blood appears.  Follow instructions that come with your meter for inserting the test strip, applying blood to the strip, and using your blood glucose meter.  Write down your result and any notes. Alternative testing  sites  Some meters allow you to use areas of your body other than your finger (alternative sites) to test your blood.  If you think you may have hypoglycemia, or if you have hypoglycemia unawareness, do not use alternative sites. Use your finger instead.  Alternative sites may not be as accurate as the fingers, because blood flow is slower in these areas. This means that the result you get may be delayed, and it may be different from the result that you would get from your finger.  The most common alternative sites are: ? Forearm. ? Thigh. ? Palm of the hand. Additional tips  Always keep your supplies with you.  If you have questions or need help, all blood glucose meters have a 24-hour "hotline" number that you can call. You may also contact your health care provider.  After you use a few boxes of test strips, adjust (calibrate) your blood glucose meter by following instructions that came with your meter. This information is not intended to replace advice given to you by your health care provider. Make sure you discuss any questions you have with your health care provider. Document Released: 08/06/2003 Document Revised: 02/21/2016 Document Reviewed: 01/13/2016 Elsevier Interactive Patient Education  2017 [place] Modification Ideas for Weight Management  Weight management involves adopting a healthy lifestyle that includes a knowledge of nutrition and exercise, a positive attitude and the right kind of motivation. Internal motives such as better health, increased energy, self-esteem and personal control increase your chances of lifelong weight management success.  Remember to have realistic goals and think long-term success. Believe in yourself and you can do it. The following information will give you ideas to help you meet your goals.  Control Your Home Environment  Eat only while sitting down at the kitchen or dining room table. Do not eat while watching  television, reading, cooking, talking on the phone, standing at the refrigerator or working on the computer. Keep tempting foods out of the house - don't buy them. Keep tempting foods out of sight. Have low-calorie foods ready to eat. Unless you are preparing a meal, stay out of the kitchen. Have healthy snacks at your disposal, such as small pieces of fruit, vegetables, canned fruit, pretzels, low-fat string cheese and nonfat cottage cheese.  Control Your Work Environment  Do not eat at [employer] or keep tempting snacks at your desk. If you get hungry between meals, plan healthy snacks and bring them with you to work. During your breaks, go for a walk instead of eating. If you work around food, plan in advance the one item you will eat at mealtime. Make it inconvenient to nibble on food by chewing gum, sugarless candy or drinking water or another low-calorie beverage. Do not work through meals. Skipping meals slows down metabolism and may result in overeating at the next meal. If food is available for special occasions, either pick the healthiest  item, nibble on low-fat snacks brought from home, don't have anything offered, choose one option and have a small amount, or have only a beverage.  Control Your Mealtime Environment  Serve your plate of food at the stove or kitchen counter. Do not put the serving dishes on the table. If you do put dishes on the table, remove them immediately when finished eating. Fill half of your plate with vegetables, a quarter with lean protein and a quarter with starch. Use smaller plates, bowls and glasses. A smaller portion will look large when it is in a little dish. Politely refuse second helpings. When fixing your plate, limit portions of food to one scoop/serving or less.   Daily Food Management  Replace eating with another activity that you will not associate with food. Wait 20 minutes before eating something you are craving. Drink a large glass of  water or diet soda before eating. Always have a big glass or bottle of water to drink throughout the day. Avoid high-calorie add-ons such as cream with your coffee, butter, mayonnaise and salad dressings.  Shopping: Do not shop when hungry or tired. Shop from a list and avoid buying anything that is not on your list. If you must have tempting foods, buy individual-sized packages and try to find a lower-calorie alternative. Don't taste test in the store. Read food labels. Compare products to help you make the healthiest choices.  Preparation: Chew a piece of gum while cooking meals. Use a quarter teaspoon if you taste test your food. Try to only fix what you are going to eat, leaving yourself no chance for seconds. If you have prepared more food than you need, portion it into individual containers and freeze or refrigerate immediately. Don't snack while cooking meals.  Eating: Eat slowly. Remember it takes about 20 minutes for your stomach to send a message to your brain that it is full. Don't let fake hunger make you think you need more. The ideal way to eat is to take a bite, put your utensil down, take a sip of water, cut your next bite, take a bit, put your utensil down and so on. Do not cut your food all at one time. Cut only as needed. Take small bites and chew your food well. Stop eating for a minute or two at least once during a meal or snack. Take breaks to reflect and have conversation.  Cleanup and Leftovers: Label leftovers for a specific meal or snack. Freeze or refrigerate individual portions of leftovers. Do not clean up if you are still hungry.  Eating Out and Social Eating  Do not arrive hungry. Eat something light before the meal. Try to fill up on low-calorie foods, such as vegetables and fruit, and eat smaller portions of the high-calorie foods. Eat foods that you like, but choose small portions. If you want seconds, wait at least 20 minutes after you have eaten  to see if you are actually hungry or if your eyes are bigger than your stomach. Limit alcoholic beverages. Try a soda water with a twist of lime. Do not skip other meals in the day to save room for the special event.  At Restaurants: Order  la carte rather than buffet style. Order some vegetables or a salad for an appetizer instead of eating bread. If you order a high-calorie dish, share it with someone. Try an after-dinner mint with your coffee. If you do have dessert, share it with two or more people. Don't overeat because you  do not want to waste food. Ask for a doggie bag to take extra food home. Tell the server to put half of your entree in a to go bag before the meal is served to you. Ask for salad dressing, gravy or high-fat sauces on the side. Dip the tip of your fork in the dressing before each bite. If bread is served, ask for only one piece. Try it plain without butter or oil. At [employer] where oil and vinegar is served with bread, use only a small amount of oil and a lot of vinegar for dipping.  At a [facility]: Offer to bring a dish, appetizer or dessert that is low in calories. Serve yourself small portions or tell the host that you only want a small amount. Stand or sit away from the snack table. Stay away from the kitchen or stay busy if you are near the food. Limit your alcohol intake.  At [employer] and Cafeterias: Cover most of your plate with lettuce and/or vegetables. Use a salad plate instead of a dinner plate. After eating, clear away your dishes before having coffee or tea.  Entertaining at Home: Explore low-fat, low-cholesterol cookbooks. Use single-serving foods like chicken breasts or hamburger patties. Prepare low-calorie appetizers and desserts.   Holidays: Keep tempting foods out of sight. Decorate the house without using food. Have low-calorie beverages and foods on hand for guests. Allow yourself one planned treat a day. Don't skip  meals to save up for the holiday feast. Eat regular, planned meals.   Exercise Well  Make exercise a priority and a planned activity in the day. If possible, walk the entire or part of the distance to work. Get an exercise buddy. Go for a walk with a colleague during one of your breaks, go to the gym, run or take a walk with a friend, walk in the mall with a shopping companion. Park at the end of the parking lot and walk to the store or office entrance. Always take the stairs all of the way or at least part of the way to your floor. If you have a desk job, walk around the office frequently. Do leg lifts while sitting at your desk. Do something outside on the weekends like going for a hike or a bike ride.   Have a Healthy Attitude  Make health your weight management priority. Be realistic. Have a goal to achieve a healthier you, not necessarily the lowest weight or ideal weight based on calculations or tables. Focus on a healthy eating style, not on dieting. Dieting usually lasts for a short amount of time and rarely produces long-term success. Think long term. You are developing new healthy behaviors to follow next month, in a year and in a decade.    This information is for educational purposes only and is not intended to replace the advice of your doctor or health care provider. We encourage you to discuss with your doctor any questions or concerns you may have.        Guidelines for Losing Weight   We want weight loss that will last so you should lose 1-2 pounds a week.  THAT IS IT! Please pick THREE things a month to change. Once it is a habit check off the item. Then pick another three items off the list to become habits.  If you are already doing a habit on the list GREAT!  Cross that item off!  Don't drink your calories. Ie, alcohol, soda, fruit juice,  and sweet tea.   Drink more water. Drink a glass when you feel hungry or before each meal.   Eat breakfast - Complex  carb and protein (likeDannon light and fit yogurt, oatmeal, fruit, eggs, [place] bacon).  Measure your cereal.  Eat no more than one cup a day. (ie Kashi)  Eat an apple a day.  Add a vegetable a day.  Try a new vegetable a month.  Use Pam! Stop using oil or butter to cook.  Don't finish your plate or use smaller plates.  Share your dessert.  Eat sugar free Jello for dessert or frozen grapes.  Don't eat 2-3 hours before bed.  Switch to whole wheat bread, pasta, and brown rice.  Make healthier choices when you eat out. No fries!  Pick baked chicken, NOT fried.  Don't forget to SLOW DOWN when you eat. It is not going anywhere.   Take the stairs.  Park far away in the parking lot  Lift soup cans (or weights) for 10 minutes while watching TV.  Walk at work for 10 minutes during break.  Walk outside 1 time a week with your friend, kids, dog, or significant other.  Start a walking group at church.  Walk the mall as much as you can tolerate.   Keep a food diary.  Weigh yourself daily.  Walk for 15 minutes 3 days per week.  Cook at home more often and eat out less. If life happens and you go back to old habits, it is okay.  Just start over. You can do it!  If you experience chest pain, get short of breath, or tired during the exercise, please stop immediately and inform your doctor.    Before you even begin to attack a weight-loss plan, it pays to remember this: You are not fat. You have fat. Losing weight isn't about blame or shame; it's simply another achievement to accomplish. Dieting is like any other skill-you have to buckle down and work at it. As long as you act in a smart, reasonable way, you'll ultimately get where you want to be. Here are some weight loss pearls for you.   1. It's Not a Diet. It's a Lifestyle Thinking of a diet as something you're on and suffering through only for the short term doesn't work. To shed weight and keep it off, you need to make  permanent changes to the way you eat. It's OK to indulge occasionally, of course, but if you cut calories temporarily and then revert to your old way of eating, you'll gain back the weight quicker than you can say yo-yo. Use it to lose it. Research shows that one of the best predictors of long-term weight loss is how many pounds you drop in the first month. For that reason, nutritionists often suggest being stricter for the first two weeks of your new eating strategy to build momentum. Cut out added sugar and alcohol and avoid unrefined carbs. After that, figure out how you can reincorporate them in a way that's healthy and maintainable.  2. There's a Right Way to Exercise Working out burns calories and fat and boosts your metabolism by building muscle. But those trying to lose weight are notorious for overestimating the number of calories they burn and underestimating the amount they take in. Unfortunately, your system is biologically programmed to hold on to extra pounds and that means when you start exercising, your body senses the deficit and ramps up its hunger signals. If you're not diligent,  you'll eat everything you burn and then some. Use it, to lose it. Cardio gets all the exercise glory, but strength and interval training are the real heroes. They help you build lean muscle, which in turn increases your metabolism and calorie-burning ability 3. Don't Overreact to Mild Hunger Some people have a hard time losing weight because of hunger anxiety. To them, being hungry is bad-something to be avoided at all costs-so they carry snacks with them and eat when they don't need to. Others eat because they're stressed out or bored. While you never want to get to the point of being ravenous (that's when bingeing is likely to happen), a hunger pang, a craving, or the fact that it's 3:00 p.m. should not send you racing for the vending machine or obsessing about the energy bar in your purse. Ideally, you should put  off eating until your stomach is growling and it's difficult to concentrate.  Use it to lose it. When you feel the urge to eat, use the HALT method. Ask yourself, Am I really hungry? Or am I angry or anxious, lonely or bored, or tired? If you're still not certain, try the apple test. If you're truly hungry, an apple should seem delicious; if it doesn't, something else is going on. Or you can try drinking water and making yourself busy, if you are still hungry try a healthy snack.  4. Not All Calories Are Created Equal The mechanics of weight loss are pretty simple: Take in fewer calories than you use for energy. But the kind of food you eat makes all the difference. Processed food that's high in saturated fat and refined starch or sugar can cause inflammation that disrupts the hormone signals that tell your brain you're full. The result: You eat a lot more.  Use it to lose it. Clean up your diet. Swap in whole, unprocessed foods, including vegetables, lean protein, and healthy fats that will fill you up and give you the biggest nutritional bang for your calorie buck. In a few weeks, as your brain starts receiving regular hunger and fullness signals once again, you'll notice that you feel less hungry overall and naturally start cutting back on the amount you eat.  5. Protein, Produce, and Plant-Based Fats Are Your Weight-Loss Trinity Here's why eating the three Ps regularly will help you drop pounds. Protein fills you up. You need it to build lean muscle, which keeps your metabolism humming so that you can torch more fat. People in a weight-loss program who ate double the recommended daily allowance for protein (about 110 grams for a 150-pound woman) lost 70 percent of their weight from fat, while people who ate the RDA lost only about 40 percent, one study found. Produce is packed with filling fiber. "It's very difficult to consume too many calories if you're eating a lot of vegetables. Example: Three cups  of broccoli is a lot of food, yet only 93 calories. (Fruit is another story. It can be easy to overeat and can contain a lot of calories from sugar, so be sure to monitor your intake.) Plant-based fats like olive oil and those in avocados and nuts are healthy and extra satiating.  Use it to lose it. Aim to incorporate each of the three Ps into every meal and snack. People who eat protein throughout the day are able to keep weight off, according to a study in the [place] of Clinical Nutrition. In addition to meat, poultry and seafood, good sources are beans,  lentils, eggs, tofu, and yogurt. As for fat, keep portion sizes in check by measuring out salad dressing, oil, and nut butters (shoot for one to two tablespoons). Finally, eat veggies or a little fruit at every meal. People who did that consumed 308 fewer calories but didn't feel any hungrier than when they didn't eat more produce.  7. How You Eat Is As Important As What You Eat In order for your brain to register that you're full, you need to focus on what you're eating. Sit down whenever you eat, preferably at a table. Turn off the TV or computer, put down your phone, and look at your food. Smell it. Chew slowly, and don't put another bite on your fork until you swallow. When women ate lunch this attentively, they consumed 30 percent less when snacking later than those who listened to an audiobook at lunchtime, according to a study in the [place] of Nutrition. 8. Weighing Yourself Really Works The scale provides the best evidence about whether your efforts are paying off. Seeing the numbers tick up or down or stagnate is motivation to keep going-or to rethink your approach. A 2015 study at [facility] found that daily weigh-ins helped people lose more weight, keep it off, and maintain that loss, even after two years. Use it to lose it. Step on the scale at the same time every day for the best results. If your weight shoots up  several pounds from one weigh-in to the next, don't freak out. Eating a lot of salt the night before or having your period is the likely culprit. The number should return to normal in a day or two. It's a steady climb that you need to do something about. 9. Too Much Stress and Too Little Sleep Are Your Enemies When you're tired and frazzled, your body cranks up the production of cortisol, the stress hormone that can cause carb cravings. Not getting enough sleep also boosts your levels of ghrelin, a hormone associated with hunger, while suppressing leptin, a hormone that signals fullness and satiety. People on a diet who slept only five and a half hours a night for two weeks lost 55 percent less fat and were hungrier than those who slept eight and a half hours, according to a study in the [place]. Use it to lose it. Prioritize sleep, aiming for seven hours or more a night, which research shows helps lower stress. And make sure you're getting quality zzz's. If a snoring spouse or a fidgety cat wakes you up frequently throughout the night, you may end up getting the equivalent of just four hours of sleep, according to a study from [facility] Keep pets out of the bedroom, and use a white-noise app to drown out snoring. 10. You Will Hit a plateau-And You Can Bust Through It As you slim down, your body releases much less leptin, the fullness hormone.  If you're not strength training, start right now. Building muscle can raise your metabolism to help you overcome a plateau. To keep your body challenged and burning calories, incorporate new moves and more intense intervals into your workouts or add another sweat session to your weekly routine. Alternatively, cut an extra 100 calories or so a day from your diet. Now that you've lost weight, your body simply doesn't need as much fuel.    Since food equals calories, in order to lose weight you must either eat fewer calories,  exercise more to burn off calories with  activity, or both. Food that is not used to fuel the body is stored as fat. A major component of losing weight is to make smarter food choices. Here's how:  1)   Limit non-nutritious foods, such as: Sugar, honey, syrups and candy Pastries, donuts, pies, cakes and cookies Soft drinks, sweetened juices and alcoholic beverages  2)  Cut down on high-fat foods by: - Choosing poultry, fish or lean red meat - Choosing low-fat cooking methods, such as baking, broiling, steaming, grilling and boiling - Using low-fat or non-fat dairy products - Using vinaigrette, herbs, lemon or fat-free salad dressings - Avoiding fatty meats, such as bacon, sausage, franks, ribs and luncheon meats - Avoiding high-fat snacks like nuts, chips and chocolate - Avoiding fried foods - Using less butter, margarine, oil and mayonnaise - Avoiding high-fat gravies, cream sauces and cream-based soups  3) Eat a variety of foods, including: - Fruit and vegetables that are raw, steamed or baked - Whole grains, breads, cereal, rice and pasta - Dairy products, such as low-fat or non-fat milk or yogurt, low-fat cottage cheese and low-fat cheese - Protein-rich foods like chicken, [place], fish, lean meat and legumes, or beans  4) Change your eating habits by: - Eat three balanced meals a day to help control your hunger - Watch portion sizes and eat small servings of a variety of foods - Choose low-calorie snacks - Eat only when you are hungry and stop when you are satisfied - Eat slowly and try not to perform other tasks while eating - Find other activities to distract you from food, such as walking, taking up a hobby or being involved in the community - Include regular exercise in your daily routine ( minimum of 20 min of moderate-intensity exercise at least 5 days/week)  - Find a support group, if necessary, for emotional support in your weight loss journey           Easy  ways to cut 100 calories   1. Eat your eggs with hot sauce OR salsa instead of cheese.  Eggs are great for breakfast, but many people consider eggs and cheese to be BFFs. Instead of cheese-1 oz. of cheddar has 114 calories-top your eggs with hot sauce, which contains no calories and helps with satiety and metabolism. Salsa is also a great option!!  2. Top your toast, waffles or pancakes with fresh berries instead of jelly or syrup. Half a cup of berries-fresh, frozen or thawed-has about 40 calories, compared with 2 tbsp. of maple syrup or jelly, which both have about 100 calories. The berries will also give you a good punch of fiber, which helps keep you full and satisfied and won't spike blood sugar quickly like the jelly or syrup. 3. Swap the non-fat latte for black coffee with a splash of half-and-half. Contrary to its name, that non-fat latte has 130 calories and a startling 19g of carbohydrates per 16 oz. serving. Replacing that 'light' drinkable dessert with a black coffee with a splash of half-and-half saves you more than 100 calories per 16 oz. serving. 4. Sprinkle salads with freeze-dried raspberries instead of dried cranberries. If you want a sweet addition to your nutritious salad, stay away from dried cranberries. They have a whopping 130 calories per  cup and 30g carbohydrates. Instead, sprinkle freeze-dried raspberries guilt-free and save more than 100 calories per  cup serving, adding 3g of belly-filling fiber. 5. Go for mustard in place of mayo on your sandwich. Mustard can add really nice flavor  to any sandwich, and there are tons of varieties, from spicy to honey. A serving of mayo is 95 calories, versus 10 calories in a serving of mustard.  Or try an avocado mayo spread: You can find the recipe few click this link: https://www.californiaavocado.com/recipes/recipe-container/[place]-avocado-mayo 6. Choose a DIY salad dressing instead of the store-bought kind. Mix Dijon or whole  grain mustard with low-fat Kefir or red wine vinegar and garlic. 7. Use hummus as a spread instead of a dip. Use hummus as a spread on a high-fiber cracker or tortilla with a sandwich and save on calories without sacrificing taste. 8. Pick just one salad "accessory." Salad isn't automatically a calorie winner. It's easy to over-accessorize with toppings. Instead of topping your salad with nuts, avocado and cranberries (all three will clock in at 313 calories), just pick one. The next day, choose a different accessory, which will also keep your salad interesting. You don't wear all your jewelry every day, right? 9. Ditch the white pasta in favor of spaghetti squash. One cup of cooked spaghetti squash has about 40 calories, compared with traditional spaghetti, which comes with more than 200. Spaghetti squash is also nutrient-dense. It's a good source of fiber and Vitamins A and C, and it can be eaten just like you would eat pasta-with a great tomato sauce and [place] meatballs or with pesto, tofu and spinach, for example. 10. Dress up your chili, soups and stews with non-fat [place] yogurt instead of sour cream. Just a 'dollop' of sour cream can set you back 115 calories and a whopping 12g of fat-seven of which are of the artery-clogging variety. Added bonus: [place] yogurt is packed with muscle-building protein, calcium and B Vitamins. 11. Mash cauliflower instead of mashed potatoes. One cup of traditional mashed potatoes-in all their creamy goodness-has more than 200 calories, compared to mashed cauliflower, which you can typically eat for less than 100 calories per 1 cup serving. Cauliflower is a great source of the antioxidant indole-3-carbinol (I3C), which may help reduce the risk of some cancers, like breast cancer. 12. Ditch the ice cream sundae in favor of a [place] yogurt parfait. Instead of a cup of ice cream or fro-yo for dessert, try 1 cup of nonfat Greek yogurt topped with fresh berries and a  sprinkle of cacao nibs. Both toppings are packed with antioxidants, which can help reduce cellular inflammation and oxidative damage. And the comparison is a no-brainer: One cup of ice cream has about 275 calories; one cup of frozen yogurt has about 230; and a cup of Greek yogurt has just 130, plus twice the protein, so you're less likely to return to the freezer for a second helping. 13. Put olive oil in a spray container instead of using it directly from the bottle. Each tablespoon of olive oil is 120 calories and 15g of fat. Use a mister instead of pouring it straight into the pan or onto a salad. This allows for portion control and will save you more than 100 calories. 14. When baking, substitute canned pumpkin for butter or oil. Canned pumpkin-not pumpkin pie mix-is loaded with Vitamin A, which is important for skin and eye health, as well as immunity. And the comparisons are pretty crazy:  cup of canned pumpkin has about 40 calories, compared to butter or oil, which has more than 800 calories. Yes, 800 calories. Applesauce and mashed banana can also serve as good substitutions for butter or oil, usually in a 1:1 ratio. 15. Top casseroles with high-fiber cereal instead  of breadcrumbs. Breadcrumbs are typically made with white bread, while breakfast cereals contain 5-9g of fiber per serving. Not only will you save more than 150 calories per  cup serving, the swap will also keep you more full and you'll get a metabolism boost from the added fiber. 16. Snack on pistachios instead of macadamia nuts. Believe it or not, you get the same amount of calories from 35 pistachios (100 calories) as you would from only five macadamia nuts. 17. Chow down on kale chips rather than potato chips. This is my favorite 'don't knock it 'till you try it' swap. Kale chips are so easy to make at home, and you can spice them up with a little grated parmesan or chili powder. Plus, they're a mere fraction of the calories of  potato chips, but with the same crunch factor we crave so often. 18. Add seltzer and some fruit slices to your cocktail instead of soda or fruit juice. One cup of soda or fruit juice can pack on as much as 140 calories. Instead, use seltzer and fruit slices. The fruit provides valuable phytochemicals, such as flavonoids and anthocyanins, which help to combat cancer and stave off the aging process.       b

## 2017-01-20 NOTE — Progress Notes (Signed)
Impression and Recommendations:    No diagnosis found.  No problem-specific Assessment & Plan notes found for this encounter.    Education and routine counseling performed. Handouts provided.   Meds ordered this encounter  Medications  . B-D ULTRAFINE III SHORT PEN 31G X 8 MM MISC    Sig: INJECT 1 PEN INTO THE SKIN DAILY.    Refill:  1     Discontinued Medications   No medications on file      No orders of the defined types were placed in this encounter.    No Follow-up on file.  The patient was counseled, risk factors were discussed, anticipatory guidance given.  Gross side effects, risk and benefits, and alternatives of medications discussed with patient.  Patient is aware that all medications have potential side effects and we are unable to predict every side effect or drug-drug interaction that may occur.  Expresses verbal understanding and consents to current therapy plan and treatment regimen.  Please see AVS handed out to patient at the end of our visit for further patient instructions/ counseling done pertaining to today's office visit.    Note: This document was prepared using Dragon voice recognition software and may include unintentional dictation errors.     Subjective:    Chief Complaint  Patient presents with  . Medication Management  . Diabetes    KELVEN FLATER is a 66 y.o. male who presents to [facility] at [facility] today for Diabetes Management.    DM HPI:  Last OV started lantus at night.   25 units.   BS running ave 180--> but better than 240 it was. Only checking in ams.    -  He has been working on diet and exercise for diabetes.  Walking more- farther distances now. Purposefully walking more at work, parking farther away.   Pt is currently maintained on the following medications for diabetes:   see med list today  Medication compliance -  great   Denies polyuria/polydipsia.  Denies hypo/ hyperglycemia  symptoms  - He denies new onset of: chest pain, exercise intolerance, shortness of breath, dizziness, visual changes, headache, lower extremity swelling or claudication.    Last diabetic eye exam was  Lab Results  Component Value Date   HMDIABEYEEXA No Retinopathy 05/05/2016    Foot exam- UTD  Last A1C in the office was:  Lab Results  Component Value Date   HGBA1C 9.9 12/23/2016   HGBA1C 7.5 06/15/2016   HGBA1C 8.3 02/27/2016    Lab Results  Component Value Date   MICROALBUR 30 06/15/2016   LDLCALC 36 03/05/2016   CREATININE 0.93 03/05/2016    Last 3 blood pressure readings in our office are as follows: BP Readings from Last 3 Encounters:  01/20/17 125/76  12/23/16 125/81  06/15/16 (!) 147/97      Patient Care Team    Relationship Specialty Notifications Start End  [name], DO PCP - General Family Medicine  02/27/16      Patient Active Problem List   Diagnosis Date Noted  . Family history of cardiovascular disease 05/24/2015    Priority: High  . Type 2 diabetes mellitus without complication, without long-term current use of insulin ([place]) 05/24/2015    Priority: High  . Pure hypercholesterolemia 09/05/2012    Priority: High  . Essential hypertension, benign 09/05/2012    Priority: High  . Morbidly obese ([place]) 03/07/2016    Priority: Medium  . Physically inactive 12/23/2016  Priority: Low  . History of tobacco abuse- less than 30 pack year history quit 1991 03/07/2016    Priority: Low  . Personal history of colonic polyps 03/07/2016  . Allergic rhinitis 03/07/2016  . History of colonic polyps 05/24/2015  . Erectile dysfunction 05/10/2014  . History of melanoma 02/05/2014  . GERD (gastroesophageal reflux disease) 02/05/2014  . Degenerative disc disease, cervical 10/30/2013  . History of surgical procedure 10/07/2011     Past Medical History:  Diagnosis Date  . Allergic rhinitis, cause unspecified   . Anemia, unspecified   . Arthritis     . Cancer ([place]) 08/17/2006   Melanoma  . Essential hypertension, benign   . Impotence of organic origin 04/14/2007  . Obesity, unspecified   . Other and unspecified hyperlipidemia   . Personal history of colonic polyps   . Skin lesion 04/14/2007   melanoma     Past Surgical History:  Procedure Laterality Date  . APPENDECTOMY    . arthroscopy of knee  2008   Left Knee  . COLONOSCOPY  08/18/2007   normal. [name].  Marland Kitchen HERNIA REPAIR     age 13  . JOINT REPLACEMENT    . melanoma of forearm resection  1992   Left        [name]  . TONSILLECTOMY AND ADENOIDECTOMY       Family History  Problem Relation Age of Onset  . Coronary artery disease Father   . Hypertension Father   . Heart disease Father        AMI cause of death  . Diabetes Father   . Cancer Mother 43       lung  . Hypertension Brother   . Diabetes Brother      History  Drug Use No  ,  History  Alcohol Use  . 3.0 oz/week  . 5 Cans of beer per week    Comment: moderate: drinks hard liquor 7 drinks per month in 08/2011/4 drinks 4 a week  ,  History  Smoking Status  . Former Smoker  . Packs/day: 1.00  . Years: 25.00  . Types: Cigarettes  . Quit date: 08/17/1989  Smokeless Tobacco  . Never Used    Comment: quit 1991  ,    Current Outpatient Prescriptions on File Prior to Visit  Medication Sig Dispense Refill  . aspirin EC 81 MG tablet Take 81 mg by mouth daily.    . cetirizine (ZYRTEC) 10 MG tablet Take 10 mg by mouth daily as needed.    Marland Kitchen glipiZIDE (GLUCOTROL XL) 10 MG 24 hr tablet TAKE 1 TABLET BY MOUTH DAILY. 90 tablet 1  . Insulin Glargine (LANTUS SOLOSTAR) 100 UNIT/ML Solostar Pen Inject 25 Units into the skin at bedtime. Please include QS 3 months. 3 pen 2  . Insulin Pen Needle (PEN NEEDLES) 30G X 8 MM MISC Inject 1 pen into the skin daily. 100 each 1  . ipratropium (ATROVENT) 0.03 % nasal spray Place 2 sprays into the nose 2 (two) times daily. 30 mL 11  . KOMBIGLYZE XR 2.12-998 MG TB24 TAKE 1 TABLET  BY MOUTH TWICE A DAY 60 tablet 0  . lisinopril (PRINIVIL,ZESTRIL) 40 MG tablet TAKE 1 TABLET BY MOUTH DAILY 90 tablet 1  . omeprazole (PRILOSEC) 20 MG capsule TAKE ONE CAPSULE BY MOUTH EVERY DAY 90 capsule 1  . PRESCRIPTION MEDICATION Clindamycin 150 mg taking 4 tabs for dental work.    . simvastatin (ZOCOR) 20 MG tablet TAKE 1 TABLET BY MOUTH  DAILY AT 6 PM. 90 tablet 1  . tadalafil (CIALIS) 20 MG tablet Take 1 tablet (20 mg total) by mouth daily as needed. 10 tablet 11   No current facility-administered medications on file prior to visit.      Allergies  Allergen Reactions  . Penicillins Hives and Swelling  . Tetracyclines & Related Hives and Swelling     Review of Systems:   General:  Denies fever, chills Optho/Auditory:   Denies visual changes, blurred vision Respiratory:   Denies SOB, cough, wheeze, DIB  Cardiovascular:   Denies chest pain, palpitations, painful respirations Gastrointestinal:   Denies nausea, vomiting, diarrhea.  Endocrine:     Denies new hot or cold intolerance Musculoskeletal:  Denies joint swelling, gait issues, or new unexplained myalgias/ arthralgias Skin:  Denies rash, suspicious lesions  Neurological:    Denies dizziness, unexplained weakness, numbness  Psychiatric/Behavioral:   Denies mood changes    Objective:     Blood pressure 125/76, pulse 76, height 5' 8.75" (1.746 m), weight 248 lb (112.5 kg).  Body mass index is 36.89 kg/m.  General: Well Developed, well nourished, and in no acute distress.  HEENT: Normocephalic, atraumatic, pupils equal round reactive to light, neck supple, No carotid bruits, no JVD Skin: Warm and dry, cap RF less 2 sec Cardiac: Regular rate and rhythm, S1, S2 WNL's, no murmurs rubs or gallops Respiratory: ECTA B/L, Not using accessory muscles, speaking in full sentences. NeuroM-Sk: Ambulates w/o assistance, moves ext * 4 w/o difficulty, sensation grossly intact.  Ext: scant edema b/l lower ext Psych: No HI/SI,  judgement and insight good, Euthymic mood. Full Affect.

## 2017-01-29 ENCOUNTER — Encounter: Payer: Self-pay | Admitting: Family Medicine

## 2017-01-29 MED ORDER — GLUCOSE BLOOD VI STRP: ORAL_STRIP | 12 refills | Status: DC

## 2017-02-13 ENCOUNTER — Other Ambulatory Visit: Payer: Self-pay | Admitting: Family Medicine

## 2017-02-13 DIAGNOSIS — E119 Type 2 diabetes mellitus without complications: Secondary | ICD-10-CM

## 2017-02-13 DIAGNOSIS — K219 Gastro-esophageal reflux disease without esophagitis: Secondary | ICD-10-CM

## 2017-02-13 DIAGNOSIS — E7800 Pure hypercholesterolemia, unspecified: Secondary | ICD-10-CM

## 2017-02-23 ENCOUNTER — Other Ambulatory Visit: Payer: Self-pay | Admitting: Family Medicine

## 2017-04-06 ENCOUNTER — Other Ambulatory Visit: Payer: Self-pay | Admitting: Family Medicine

## 2017-04-06 DIAGNOSIS — E119 Type 2 diabetes mellitus without complications: Secondary | ICD-10-CM

## 2017-04-22 ENCOUNTER — Encounter: Payer: Self-pay | Admitting: Family Medicine

## 2017-04-22 ENCOUNTER — Ambulatory Visit (INDEPENDENT_AMBULATORY_CARE_PROVIDER_SITE_OTHER): Payer: 59 | Admitting: Family Medicine

## 2017-04-22 VITALS — BP 134/82 | HR 79 | Ht 70.0 in | Wt 249.0 lb

## 2017-04-22 DIAGNOSIS — I152 Hypertension secondary to endocrine disorders: Secondary | ICD-10-CM

## 2017-04-22 DIAGNOSIS — E119 Type 2 diabetes mellitus without complications: Secondary | ICD-10-CM | POA: Diagnosis not present

## 2017-04-22 DIAGNOSIS — I10 Essential (primary) hypertension: Secondary | ICD-10-CM

## 2017-04-22 DIAGNOSIS — Z87891 Personal history of nicotine dependence: Secondary | ICD-10-CM

## 2017-04-22 DIAGNOSIS — E1159 Type 2 diabetes mellitus with other circulatory complications: Secondary | ICD-10-CM

## 2017-04-22 DIAGNOSIS — Z8249 Family history of ischemic heart disease and other diseases of the circulatory system: Secondary | ICD-10-CM

## 2017-04-22 DIAGNOSIS — E785 Hyperlipidemia, unspecified: Secondary | ICD-10-CM

## 2017-04-22 DIAGNOSIS — E1169 Type 2 diabetes mellitus with other specified complication: Secondary | ICD-10-CM

## 2017-04-22 LAB — POCT GLYCOSYLATED HEMOGLOBIN (HGB A1C): HEMOGLOBIN A1C: 6.8

## 2017-04-22 NOTE — Patient Instructions (Addendum)
A1c 6.8 this time  Please look into apps like glucose buddy, lose it app, Control and instrumentation engineer and the like.  Since your blood sugars are kind of a little bit all over the place it tells me that without changing the medicine, your diet and lifestyle\ activity levels are really making an impact on your blood sugars.  By using these apps above you can really pay attention to what you put in your body in terms of food, and how much you exercise or move.    - Please let me know if you would like to go for diabetes educator appointment  Continue to monitor your blood sugars, bring in a log next office visit as well.  In the meantime, I will also send you a message through my chart discussing your most recent labs.  If you have questions please let me know  Please try to get back to walking.  Do not a level ground, and if you can a forgiving surface.  Not concrete or pavement.  Start slowly by 5-10 minutes daily and slowly work your way up.  Please ice your left knee 15-20 minutes 3-4 times a day and especially after exercise as needed

## 2017-04-22 NOTE — Progress Notes (Signed)
Impression and Recommendations:    1. Type 2 diabetes mellitus without complication, without long-term current use of insulin (HCC)     - Continue current medications\insulin for now.  - Please look into apps like glucose buddy, lose it app, Control and instrumentation engineer and the like.  Since your blood sugars are kind of a little bit all over the place it tells me that without changing the medicine, your diet and lifestyle\ activity levels are really making an impact on your blood sugars.  By using these apps above you can really pay attention to what you put in your body in terms of food, and how much you exercise or move.    - Please let me know if you would like to go for diabetes educator appointment  Continue to monitor your blood sugars, bring in a log next office visit as well.  In the meantime, I will also send you a message through my chart discussing your most recent labs.  If you have questions please let me know   No problem-specific Assessment & Plan notes found for this encounter.    Education and routine counseling performed. Handouts provided.  New Prescriptions   No medications on file    No orders of the defined types were placed in this encounter.   Modified Medications   No medications on file    Discontinued Medications   IPRATROPIUM (ATROVENT) 0.03 % NASAL SPRAY    Place 2 sprays into the nose 2 (two) times daily.   PRESCRIPTION MEDICATION    Clindamycin 150 mg taking 4 tabs for dental work.     Orders Placed This Encounter  Procedures  . POCT glycosylated hemoglobin (Hb A1C)     No Follow-up on file.  The patient was counseled, risk factors were discussed, anticipatory guidance given.  Gross side effects, risk and benefits, and alternatives of medications discussed with patient.  Patient is aware that all medications have potential side effects and we are unable to predict every side effect or drug-drug interaction that may occur.  Expresses verbal  understanding and consents to current therapy plan and treatment regimen.  Please see AVS handed out to patient at the end of our visit for further patient instructions/ counseling done pertaining to today's office visit.    Note: This document was prepared using Dragon voice recognition software and may include unintentional dictation errors.     Subjective:     Joseph Collier is a 66 y.o. male who presents to [place] at [facility] today for Diabetes Management.    Today he also tells me that for the past month or so his left knee has been bothering him- some pain in slight swelling with activity.  Worse with stairs and worse with weight-bearing exercise especially uneven grounds/ up hills or stairs.  Does not feel he gets going to give out on him.  He doesn't remember any incident that caused the pain, nor any injury per se.  He was walking prior to this however, for the past month he has not been exercising at all.  Over the past week and is gotten much better and patient tells me it's not bad enough for me to assess it today.  If it flares up again he will come in for reassessment, possible x-rays and possible injection.   DM HPI: A1c was 9.9 Last office visit on 5\9\18.  We started patient on Lantus at night.  -  He has been  working on diet and exercise for diabetes  Medication compliance - good.    Home glucose readings range FBS---> 194, 123, 72, 205   Denies polyuria/polydipsia.  Denies hypo/ hyperglycemia symptoms  - He denies new onset of: chest pain, exercise intolerance, shortness of breath, dizziness, visual changes, headache, lower extremity swelling or claudication.   Complications:  polyneuropathy no, proliferative retinopathy no, Proteinuira no  Last diabetic eye exam was  Lab Results  Component Value Date   HMDIABEYEEXA No Retinopathy 05/05/2016    Foot exam- UTD  Last A1C in the office was:  Lab Results  Component Value Date   HGBA1C  9.9 12/23/2016   HGBA1C 7.5 06/15/2016   HGBA1C 8.3 02/27/2016    Lab Results  Component Value Date   MICROALBUR 30 06/15/2016   LDLCALC 36 03/05/2016   CREATININE 0.93 03/05/2016    Last 3 blood pressure readings in our office are as follows: BP Readings from Last 3 Encounters:  04/22/17 134/82  01/20/17 125/76  12/23/16 125/81      Patient Care Team    Relationship Specialty Notifications Start End  [name], DO PCP - General Family Medicine  02/27/16      Patient Active Problem List   Diagnosis Date Noted  . Family history of cardiovascular disease 05/24/2015    Priority: High  . Type 2 diabetes mellitus without complication, without long-term current use of insulin ([place]) 05/24/2015    Priority: High  . Pure hypercholesterolemia 09/05/2012    Priority: High  . Essential hypertension, benign 09/05/2012    Priority: High  . Morbidly obese ([place]) 03/07/2016    Priority: Medium  . Physically inactive 12/23/2016    Priority: Low  . History of tobacco abuse- less than 30 pack year history quit 1991 03/07/2016    Priority: Low  . Personal history of colonic polyps 03/07/2016  . Allergic rhinitis 03/07/2016  . History of colonic polyps 05/24/2015  . Erectile dysfunction 05/10/2014  . History of melanoma 02/05/2014  . GERD (gastroesophageal reflux disease) 02/05/2014  . Degenerative disc disease, cervical 10/30/2013  . History of surgical procedure 10/07/2011     Past Medical History:  Diagnosis Date  . Allergic rhinitis, cause unspecified   . Anemia, unspecified   . Arthritis   . Cancer ([place]) 08/17/2006   Melanoma  . Essential hypertension, benign   . Impotence of organic origin 04/14/2007  . Obesity, unspecified   . Other and unspecified hyperlipidemia   . Personal history of colonic polyps   . Skin lesion 04/14/2007   melanoma     Past Surgical History:  Procedure Laterality Date  . APPENDECTOMY    . arthroscopy of knee  2008   Left Knee  .  COLONOSCOPY  08/18/2007   normal. [name].  Marland Kitchen HERNIA REPAIR     age 21  . JOINT REPLACEMENT    . melanoma of forearm resection  1992   Left        [name]  . TONSILLECTOMY AND ADENOIDECTOMY       Family History  Problem Relation Age of Onset  . Coronary artery disease Father   . Hypertension Father   . Heart disease Father        AMI cause of death  . Diabetes Father   . Cancer Mother 45       lung  . Hypertension Brother   . Diabetes Brother      History  Drug Use No  ,  History  Alcohol  Use  . 3.0 oz/week  . 5 Cans of beer per week    Comment: moderate: drinks hard liquor 7 drinks per month in 08/2011/4 drinks 4 a week  ,  History  Smoking Status  . Former Smoker  . Packs/day: 1.00  . Years: 25.00  . Types: Cigarettes  . Quit date: 08/17/1989  Smokeless Tobacco  . Never Used    Comment: quit 1991  ,    Current Outpatient Prescriptions on File Prior to Visit  Medication Sig Dispense Refill  . aspirin EC 81 MG tablet Take 81 mg by mouth daily.    . B-D ULTRAFINE III SHORT PEN 31G X 8 MM MISC INJECT 1 PEN INTO THE SKIN DAILY.  1  . cetirizine (ZYRTEC) 10 MG tablet Take 10 mg by mouth daily as needed.    Marland Kitchen glipiZIDE (GLUCOTROL XL) 10 MG 24 hr tablet TAKE 1 TABLET BY MOUTH DAILY. 90 tablet 1  . glucose blood test strip Use as instructed 100 each 12  . Insulin Pen Needle (PEN NEEDLES) 30G X 8 MM MISC Inject 1 pen into the skin daily. 100 each 1  . KOMBIGLYZE XR 2.12-998 MG TB24 TAKE 1 TABLET BY MOUTH TWICE A DAY 180 tablet 1  . LANTUS SOLOSTAR 100 UNIT/ML Solostar Pen INJECT 30 UNITS INTO THE SKIN AT BEDTIME X 3 MONTHS *NOT COVERED* 8 pen 1  . lisinopril (PRINIVIL,ZESTRIL) 40 MG tablet TAKE 1 TABLET BY MOUTH DAILY 90 tablet 1  . omeprazole (PRILOSEC) 20 MG capsule TAKE ONE CAPSULE BY MOUTH EVERY DAY 90 capsule 1  . simvastatin (ZOCOR) 20 MG tablet TAKE 1 TABLET BY MOUTH DAILY AT 6 PM. 90 tablet 1  . tadalafil (CIALIS) 20 MG tablet Take 1 tablet (20 mg total) by mouth  daily as needed. 10 tablet 11   No current facility-administered medications on file prior to visit.      Allergies  Allergen Reactions  . Penicillins Hives and Swelling  . Tetracyclines & Related Hives and Swelling     Review of Systems:   General:  Denies fever, chills Optho/Auditory:   Denies visual changes, blurred vision Respiratory:   Denies SOB, cough, wheeze, DIB  Cardiovascular:   Denies chest pain, palpitations, painful respirations Gastrointestinal:   Denies nausea, vomiting, diarrhea.  Endocrine:     Denies new hot or cold intolerance Musculoskeletal:  Denies joint swelling, gait issues, or new unexplained myalgias/ arthralgias Skin:  Denies rash, suspicious lesions  Neurological:    Denies dizziness, unexplained weakness, numbness  Psychiatric/Behavioral:   Denies mood changes    Objective:     Blood pressure 134/82, pulse 79, height 5\' 10"  (1.778 m), weight 249 lb (112.9 kg).  Body mass index is 35.73 kg/m.  General: Well Developed, well nourished, and in no acute distress.  HEENT: Normocephalic, atraumatic, pupils equal round reactive to light, neck supple, No carotid bruits, no JVD Skin: Warm and dry, cap RF less 2 sec Cardiac: Regular rate and rhythm, S1, S2 WNL's, no murmurs rubs or gallops Respiratory: ECTA B/L, Not using accessory muscles, speaking in full sentences. NeuroM-Sk: Ambulates w/o assistance, moves ext * 4 w/o difficulty, sensation grossly intact.  Ext: scant edema b/l lower ext Psych: No HI/SI, judgement and insight good, Euthymic mood. Full Affect.   No results found for this or any previous visit (from the past 2160 hour(s)).

## 2017-04-23 LAB — CBC WITH DIFFERENTIAL/PLATELET
BASOS ABS: 0 10*3/uL (ref 0.0–0.2)
Basos: 0 %
EOS (ABSOLUTE): 0.2 10*3/uL (ref 0.0–0.4)
Eos: 2 %
HEMATOCRIT: 45.2 % (ref 37.5–51.0)
HEMOGLOBIN: 15 g/dL (ref 13.0–17.7)
Immature Grans (Abs): 0 10*3/uL (ref 0.0–0.1)
Immature Granulocytes: 0 %
LYMPHS ABS: 2.4 10*3/uL (ref 0.7–3.1)
Lymphs: 25 %
MCH: 29.5 pg (ref 26.6–33.0)
MCHC: 33.2 g/dL (ref 31.5–35.7)
MCV: 89 fL (ref 79–97)
MONOS ABS: 1.1 10*3/uL — AB (ref 0.1–0.9)
Monocytes: 11 %
NEUTROS ABS: 5.9 10*3/uL (ref 1.4–7.0)
Neutrophils: 62 %
Platelets: 292 10*3/uL (ref 150–379)
RBC: 5.08 x10E6/uL (ref 4.14–5.80)
RDW: 13.3 % (ref 12.3–15.4)
WBC: 9.7 10*3/uL (ref 3.4–10.8)

## 2017-04-23 LAB — COMPREHENSIVE METABOLIC PANEL
ALBUMIN: 4.8 g/dL (ref 3.6–4.8)
ALK PHOS: 48 IU/L (ref 39–117)
ALT: 20 IU/L (ref 0–44)
AST: 18 IU/L (ref 0–40)
Albumin/Globulin Ratio: 1.8 (ref 1.2–2.2)
BILIRUBIN TOTAL: 0.7 mg/dL (ref 0.0–1.2)
BUN / CREAT RATIO: 15 (ref 10–24)
BUN: 16 mg/dL (ref 8–27)
CHLORIDE: 101 mmol/L (ref 96–106)
CO2: 22 mmol/L (ref 20–29)
Calcium: 10.2 mg/dL (ref 8.6–10.2)
Creatinine, Ser: 1.04 mg/dL (ref 0.76–1.27)
GFR calc Af Amer: 87 mL/min/{1.73_m2} (ref >59)
GFR calc non Af Amer: 76 mL/min/{1.73_m2} (ref >59)
GLOBULIN, TOTAL: 2.6 g/dL (ref 1.5–4.5)
Glucose: 176 mg/dL — ABNORMAL HIGH (ref 65–99)
Potassium: 5.4 mmol/L — ABNORMAL HIGH (ref 3.5–5.2)
SODIUM: 143 mmol/L (ref 134–144)
TOTAL PROTEIN: 7.4 g/dL (ref 6.0–8.5)

## 2017-04-23 LAB — LIPID PANEL
CHOLESTEROL TOTAL: 100 mg/dL (ref 100–199)
Chol/HDL Ratio: 2.6 ratio (ref 0.0–5.0)
HDL: 39 mg/dL — ABNORMAL LOW (ref >39)
LDL CALC: 47 mg/dL (ref 0–99)
Triglycerides: 71 mg/dL (ref 0–149)
VLDL Cholesterol Cal: 14 mg/dL (ref 5–40)

## 2017-04-23 LAB — TSH: TSH: 1.28 u[IU]/mL (ref 0.450–4.500)

## 2017-04-23 LAB — VITAMIN D 25 HYDROXY (VIT D DEFICIENCY, FRACTURES): VIT D 25 HYDROXY: 57 ng/mL (ref 30.0–100.0)

## 2017-06-18 ENCOUNTER — Encounter: Payer: Self-pay | Admitting: Family Medicine

## 2017-06-18 ENCOUNTER — Ambulatory Visit (INDEPENDENT_AMBULATORY_CARE_PROVIDER_SITE_OTHER): Payer: 59 | Admitting: Family Medicine

## 2017-06-18 VITALS — BP 154/84 | HR 70 | Ht 69.0 in | Wt 250.8 lb

## 2017-06-18 DIAGNOSIS — Z719 Counseling, unspecified: Secondary | ICD-10-CM | POA: Diagnosis not present

## 2017-06-18 DIAGNOSIS — Z23 Encounter for immunization: Secondary | ICD-10-CM

## 2017-06-18 DIAGNOSIS — Z1211 Encounter for screening for malignant neoplasm of colon: Secondary | ICD-10-CM | POA: Diagnosis not present

## 2017-06-18 DIAGNOSIS — Z0000 Encounter for general adult medical examination without abnormal findings: Secondary | ICD-10-CM | POA: Diagnosis not present

## 2017-06-18 DIAGNOSIS — Z1389 Encounter for screening for other disorder: Secondary | ICD-10-CM | POA: Diagnosis not present

## 2017-06-18 DIAGNOSIS — E119 Type 2 diabetes mellitus without complications: Secondary | ICD-10-CM

## 2017-06-18 LAB — POC HEMOCCULT BLD/STL (OFFICE/1-CARD/DIAGNOSTIC): Fecal Occult Blood, POC: NEGATIVE

## 2017-06-18 NOTE — Progress Notes (Signed)
Male physical  Impression and Recommendations:    1. Encounter for wellness examination   2. Encounter for screening fecal occult blood testing   3. Health education/counseling   4. Screening for multiple conditions   5. Type 2 diabetes mellitus without complication, without long-term current use of insulin ([place])   6. Flu vaccine need     Orders Placed This Encounter  Procedures  . Flu Vaccine QUAD 6+ mos PF IM (Fluarix Quad PF)  . POC Hemoccult Bld/Stl (1-Cd Office Dx)  . HM Diabetes Foot Exam    Please see AVS handed out to patient at the end of our visit for further patient instructions/ counseling done pertaining to today's office visit.  1) Anticipatory Guidance: Discussed importance of wearing a seatbelt while driving, not texting while driving;   sunscreen when outside along with skin surveillance; eating a balanced and modest diet; physical activity at least 25 minutes per day or 150 min/ week moderate to intense activity.  2) Immunizations / Screenings / Labs:  All immunizations are up-to-date per recommendations or will be updated today. Patient is due for dental and vision screens which pt will schedule independently. Will obtain CBC, CMP, HgA1c, Lipid panel, TSH and vit D when fasting, if not already done recently.   3) Weight:  BMI meaning discussed with patient.  Discussed goal of losing 5-10% of current body weight which would improve overall feelings of well being and improve objective health data. Improve nutrient density of diet through increasing intake of fruits and vegetables and decreasing saturated fats, white flour products and refined sugars.    Gross side effects, risk and benefits, and alternatives of medications discussed with patient.  Patient is aware that all medications have potential side effects and we are unable to predict every side effect or drug-drug interaction that may occur.  Expresses verbal understanding and consents to current  therapy plan and treatment regimen.  Follow-up preventative CPE in 1 year. Follow-up office visit pending lab work.  F/up sooner for chronic care management and/or prn    Subjective:    CC: CPE  HPI: Joseph Collier is a 66 y.o. male who presents to [place] at [facility] today for a yearly health maintenance exam.     Health Maintenance Summary Reviewed and updated, unless pt declines services.   Health Maintenance  Topic Date Due  . OPHTHALMOLOGY EXAM  05/05/2017  . COLONOSCOPY  02/09/2019 (Originally 01/24/2018)  . PNA vac Low Risk Adult (1 of 2 - PCV13) 05/10/2019 (Originally 01/28/2018)  . Hepatitis C Screening  04/22/2029 (Originally 1951/08/03)  . HEMOGLOBIN A1C  02/07/2019  . FOOT EXAM  08/09/2019  . TETANUS/TDAP  09/14/2021  . INFLUENZA VACCINE  Completed  . HIV Screening  Completed    Pneumovax/PPSV23:  Up to date: see Immunizations. Prevnar 13/PCV13:    To be administered at this encounter. Zostavax:    Postponed by pt- will think about it Alcohol:    No concerns, no excessive use Exercise Habits:   Not meeting AHA guidelines STD concerns:   none Drug Use:   None Birth control method:   n/a Testicular/penile concerns:     None, just has ED- long standing   Health Maintenance  Topic Date Due  . OPHTHALMOLOGY EXAM  05/05/2017  . COLONOSCOPY  02/09/2019 (Originally 01/24/2018)  . PNA vac Low Risk Adult (1 of 2 - PCV13) 05/10/2019 (Originally 01/28/2018)  . Hepatitis C Screening  04/22/2029 (Originally  [birth year])  . HEMOGLOBIN A1C  02/07/2019  . FOOT EXAM  08/09/2019  . TETANUS/TDAP  09/14/2021  . INFLUENZA VACCINE  Completed  . HIV Screening  Completed      Wt Readings from Last 3 Encounters:  08/08/18 241 lb 6.4 oz (109.5 kg)  05/09/18 225 lb 12.8 oz (102.4 kg)  02/08/18 231 lb (104.8 kg)   BP Readings from Last 3 Encounters:  08/08/18 129/85  05/09/18 136/88  02/08/18 125/78   Pulse Readings from Last 3 Encounters:  08/08/18 81    05/09/18 85  02/08/18 87    Patient Active Problem List   Diagnosis Date Noted  . Family history of cardiovascular disease 05/24/2015    Priority: High  . Diabetes mellitus, type II ([place]) 05/24/2015    Priority: High  . Hyperlipidemia associated with type 2 diabetes mellitus ([place]) 09/05/2012    Priority: High  . Hypertension associated with diabetes ([place]) 09/05/2012    Priority: High  . Class 1 obesity due to excess calories with serious comorbidity in adult 03/07/2016    Priority: Medium  . Physically inactive 12/23/2016    Priority: Low  . History of tobacco abuse- less than 30 pack year history quit 1991 03/07/2016    Priority: Low  . Health education/counseling 10/18/2018  . Elevated lipase 02/08/2018  . Personal history of colonic polyps 03/07/2016  . Allergic rhinitis 03/07/2016  . History of colonic polyps 05/24/2015  . Erectile dysfunction 05/10/2014  . History of melanoma 02/05/2014  . GERD (gastroesophageal reflux disease) 02/05/2014  . Degenerative disc disease, cervical 10/30/2013  . History of surgical procedure 10/07/2011    Past Medical History:  Diagnosis Date  . Allergic rhinitis, cause unspecified   . Anemia, unspecified   . Arthritis   . Cancer ([place]) 08/17/2006   Melanoma  . Essential hypertension, benign   . Impotence of organic origin 04/14/2007  . Obesity, unspecified   . Other and unspecified hyperlipidemia   . Personal history of colonic polyps   . Skin lesion 04/14/2007   melanoma    Past Surgical History:  Procedure Laterality Date  . APPENDECTOMY    . arthroscopy of knee  2008   Left Knee  . COLONOSCOPY  08/18/2007   normal. [name].  Marland Kitchen HERNIA REPAIR     age 81  . JOINT REPLACEMENT    . melanoma of forearm resection  1992   Left        [name]  . TONSILLECTOMY AND ADENOIDECTOMY      Family History  Problem Relation Age of Onset  . Coronary artery disease Father   . Hypertension Father   . Heart disease Father        AMI cause of  death  . Diabetes Father   . Cancer Mother 38       lung  . Hypertension Brother   . Diabetes Brother     Social History   Substance and Sexual Activity  Drug Use No  ,  Social History   Substance and Sexual Activity  Alcohol Use Yes  . Alcohol/week: 5.0 standard drinks  . Types: 5 Cans of beer per week   Comment: moderate: drinks hard liquor 7 drinks per month in 08/2011/4 drinks 4 a week  ,  Social History   Tobacco Use  Smoking Status Former Smoker  . Packs/day: 1.00  . Years: 25.00  . Pack years: 25.00  . Types: Cigarettes  . Last attempt to quit: 08/17/1989  . Years since  quitting: 29.1  Smokeless Tobacco Never Used  Tobacco Comment   quit 1991  ,  Social History   Substance and Sexual Activity  Sexual Activity Yes  . Birth control/protection: Post-menopausal    Patient's Medications  New Prescriptions   SILDENAFIL (REVATIO) 20 MG TABLET    1-3 tabs prior to intercourse as needed  Previous Medications   ASPIRIN EC 81 MG TABLET    Take 81 mg by mouth daily.   FLUTICASONE (FLONASE) 50 MCG/ACT NASAL SPRAY    Place 2 sprays into both nostrils daily.   GLUCOSE BLOOD TEST STRIP    Use as instructed  Modified Medications   Modified Medication Previous Medication   DULAGLUTIDE (TRULICITY) 1.5 IR/4.8NI SOPN TRULICITY 1.5 OE/7.0JJ SOPN      Inject 1.5 mg into the skin once a week.    INJECT 1.5MG  SUBCUTANEOUSLY ONCE A WEEK   LISINOPRIL (PRINIVIL,ZESTRIL) 40 MG TABLET lisinopril (PRINIVIL,ZESTRIL) 40 MG tablet      TAKE 1 TABLET BY MOUTH EVERY DAY    TAKE 1 TABLET BY MOUTH EVERY DAY   OMEPRAZOLE (PRILOSEC) 20 MG CAPSULE omeprazole (PRILOSEC) 20 MG capsule      TAKE 1 CAPSULE BY MOUTH EVERY DAY    TAKE 1 CAPSULE BY MOUTH EVERY DAY   SIMVASTATIN (ZOCOR) 20 MG TABLET simvastatin (ZOCOR) 20 MG tablet      TAKE 1 TABLET BY MOUTH DAILY AT 6 PM.    TAKE 1 TABLET BY MOUTH DAILY AT 6 PM.   TADALAFIL (ADCIRCA/CIALIS) 20 MG TABLET tadalafil (CIALIS) 20 MG tablet      Take 1  tablet (20 mg total) by mouth daily as needed.    Take 1 tablet (20 mg total) by mouth daily as needed.  Discontinued Medications   B-D ULTRAFINE III SHORT PEN 31G X 8 MM MISC    INJECT 1 PEN INTO THE SKIN DAILY.   CETIRIZINE (ZYRTEC) 10 MG TABLET    Take 10 mg by mouth daily as needed.   GLIPIZIDE (GLUCOTROL XL) 10 MG 24 HR TABLET    TAKE 1 TABLET BY MOUTH DAILY.   INSULIN PEN NEEDLE (PEN NEEDLES) 30G X 8 MM MISC    Inject 1 pen into the skin daily.   KOMBIGLYZE XR 2.12-998 MG TB24    TAKE 1 TABLET BY MOUTH TWICE A DAY   LANTUS SOLOSTAR 100 UNIT/ML SOLOSTAR PEN    INJECT 30 UNITS INTO THE SKIN AT BEDTIME X 3 MONTHS *NOT COVERED*   LISINOPRIL (PRINIVIL,ZESTRIL) 40 MG TABLET    TAKE 1 TABLET BY MOUTH DAILY   OMEPRAZOLE (PRILOSEC) 20 MG CAPSULE    TAKE ONE CAPSULE BY MOUTH EVERY DAY   SIMVASTATIN (ZOCOR) 20 MG TABLET    TAKE 1 TABLET BY MOUTH DAILY AT 6 PM.    Penicillins and Tetracyclines & related  Review of Systems: General:   Denies fever, chills, unexplained weight loss.  Optho/Auditory:   Denies visual changes, blurred vision/LOV Respiratory:   Denies SOB, DOE more than baseline levels.  Cardiovascular:   Denies chest pain, palpitations, new onset peripheral edema  Gastrointestinal:   Denies nausea, vomiting, diarrhea.  Genitourinary: Denies dysuria, freq/ urgency, flank pain or discharge from genitals.  Endocrine:     Denies hot or cold intolerance, polyuria, polydipsia. Musculoskeletal:   Denies unexplained myalgias, joint swelling, unexplained arthralgias, gait problems.  Skin:  Denies rash, suspicious lesions Neurological:     Denies dizziness, unexplained weakness, numbness  Psychiatric/Behavioral:   Denies mood changes, suicidal  or homicidal ideations, hallucinations    Objective:     Blood pressure (!) 154/84, pulse 70, height 5\' 9"  (1.753 m), weight 250 lb 12.8 oz (113.8 kg). Body mass index is 37.04 kg/m. General Appearance:    Alert, cooperative, no distress,  appears stated age  Head:    Normocephalic, without obvious abnormality, atraumatic  Eyes:    PERRL, conjunctiva/corneas clear, EOM's intact, fundi    benign, both eyes  Ears:    Normal TM's and external ear canals, both ears  Nose:   Nares normal, septum midline, mucosa normal, no drainage    or sinus tenderness  Throat:   Lips w/o lesion, mucosa moist, and tongue normal; teeth and   gums normal  Neck:   Supple, symmetrical, trachea midline, no adenopathy;    thyroid:  no enlargement/tenderness/nodules; no carotid   bruit or JVD  Back:     Symmetric, no curvature, ROM normal, no CVA tenderness  Lungs:     Clear to auscultation bilaterally, respirations unlabored, no       Wh/ R/ R  Chest Wall:    No tenderness or gross deformity; normal excursion   Heart:    Regular rate and rhythm, S1 and S2 normal, no murmur, rub   or gallop  Abdomen:     Soft, non-tender, bowel sounds active all four quadrants, NO   G/R/R, no masses, no organomegaly  Genitalia:    Ext genitalia: without lesion, no penile rash or discharge, no hernias appreciated   Rectal:    Normal tone, prostate WNL's and equal b/l, no tenderness; guaiac negative stool  Extremities:   Extremities normal, atraumatic, no cyanosis or gross edema  Pulses:   2+ and symmetric all extremities  Skin:   Warm, dry, Skin color, texture, turgor normal, no obvious rashes or lesions  M-Sk:   Ambulates * 4 w/o difficulty, no gross deformities, tone WNL  Neurologic:   CNII-XII intact, normal strength, sensation and reflexes    Throughout Psych:  No HI/SI, judgement and insight good, Euthymic mood. Full Affect.

## 2017-06-18 NOTE — Patient Instructions (Signed)

## 2017-07-27 ENCOUNTER — Other Ambulatory Visit: Payer: Self-pay | Admitting: Family Medicine

## 2017-07-27 DIAGNOSIS — E7800 Pure hypercholesterolemia, unspecified: Secondary | ICD-10-CM

## 2017-07-27 DIAGNOSIS — K219 Gastro-esophageal reflux disease without esophagitis: Secondary | ICD-10-CM

## 2017-07-27 DIAGNOSIS — E119 Type 2 diabetes mellitus without complications: Secondary | ICD-10-CM

## 2017-08-03 ENCOUNTER — Encounter: Payer: Self-pay | Admitting: Family Medicine

## 2017-08-03 ENCOUNTER — Ambulatory Visit: Payer: 59 | Admitting: Family Medicine

## 2017-08-03 VITALS — BP 138/86 | HR 64 | Ht 69.0 in | Wt 255.4 lb

## 2017-08-03 DIAGNOSIS — E119 Type 2 diabetes mellitus without complications: Secondary | ICD-10-CM

## 2017-08-03 DIAGNOSIS — I10 Essential (primary) hypertension: Secondary | ICD-10-CM

## 2017-08-03 DIAGNOSIS — E785 Hyperlipidemia, unspecified: Secondary | ICD-10-CM | POA: Diagnosis not present

## 2017-08-03 DIAGNOSIS — Z723 Lack of physical exercise: Secondary | ICD-10-CM

## 2017-08-03 DIAGNOSIS — E1159 Type 2 diabetes mellitus with other circulatory complications: Secondary | ICD-10-CM

## 2017-08-03 DIAGNOSIS — E1169 Type 2 diabetes mellitus with other specified complication: Secondary | ICD-10-CM | POA: Diagnosis not present

## 2017-08-03 LAB — POCT GLYCOSYLATED HEMOGLOBIN (HGB A1C): Hemoglobin A1C: 7.3

## 2017-08-03 MED ORDER — BASAGLAR KWIKPEN 100 UNIT/ML ~~LOC~~ SOPN: 30.0000 [IU] | PEN_INJECTOR | Freq: Every day | SUBCUTANEOUS | 3 refills | Status: DC

## 2017-08-03 NOTE — Patient Instructions (Addendum)
a1c 7.3 today.  Was 6.8 recently in Sept.   -We will discontinue her Lantus and switching to basaglar.  Please use the 2 pens we gave you as samples from our supply to get you started.   Please realize, EXERCISE IS MEDICINE!  -  American Heart Association [facility]) guidelines for exercise : If you are in good health, without any medical conditions, you should engage in 150 minutes of moderate intensity aerobic activity per week.  This means you should be huffing and puffing throughout your workout.   Engaging in regular exercise will improve brain function and memory, as well as improve mood, boost immune system and help with weight management.  As well as the other, more well-known effects of exercise such as decreasing blood sugar levels, decreasing blood pressure,  and decreasing bad cholesterol levels/ increasing good cholesterol levels.     -  The AHA strongly endorses consumption of a diet that contains a variety of foods from all the food categories with an emphasis on fruits and vegetables; fat-free and low-fat dairy products; cereal and grain products; legumes and nuts; and fish, poultry, and/or extra lean meats.    Excessive food intake, especially of foods high in saturated and trans fats, sugar, and salt, should be avoided.    Adequate water intake of roughly 1/2 of your weight in pounds, should equal the ounces of water per day you should drink.  So for instance, if you're 200 pounds, that would be 100 ounces of water per day.         Mediterranean Diet  Why follow it? Research shows. . Those who follow the Mediterranean diet have a reduced risk of heart disease  . The diet is associated with a reduced incidence of Parkinson's and Alzheimer's diseases . People following the diet may have longer life expectancies and lower rates of chronic diseases  . The Dietary Guidelines for Americans recommends the Mediterranean diet as an eating plan to promote health and prevent disease  What Is  the Mediterranean Diet?  . Healthy eating plan based on typical foods and recipes of Mediterranean-style cooking . The diet is primarily a plant based diet; these foods should make up a majority of meals   Starches - Plant based foods should make up a majority of meals - They are an important sources of vitamins, minerals, energy, antioxidants, and fiber - Choose whole grains, foods high in fiber and minimally processed items  - Typical grain sources include wheat, oats, barley, corn, brown rice, bulgar, farro, millet, polenta, couscous  - Various types of beans include chickpeas, lentils, fava beans, black beans, white beans   Fruits  Veggies - Large quantities of antioxidant rich fruits & veggies; 6 or more servings  - Vegetables can be eaten raw or lightly drizzled with oil and cooked  - Vegetables common to the traditional Mediterranean Diet include: artichokes, arugula, beets, broccoli, brussel sprouts, cabbage, carrots, celery, collard greens, cucumbers, eggplant, kale, leeks, lemons, lettuce, mushrooms, okra, onions, peas, peppers, potatoes, pumpkin, radishes, rutabaga, shallots, spinach, sweet potatoes, turnips, zucchini - Fruits common to the Mediterranean Diet include: apples, apricots, avocados, cherries, clementines, dates, figs, grapefruits, grapes, melons, nectarines, oranges, peaches, pears, pomegranates, strawberries, tangerines  Fats - Replace butter and margarine with healthy oils, such as olive oil, canola oil, and tahini  - Limit nuts to no more than a handful a day  - Nuts include walnuts, almonds, pecans, pistachios, pine nuts  - Limit or avoid candied, honey roasted  or heavily salted nuts - Olives are central to the Mediterranean diet - can be eaten whole or used in a variety of dishes   Meats Protein - Limiting red meat: no more than a few times a month - When eating red meat: choose lean cuts and keep the portion to the size of deck of cards - Eggs: approx. 0 to 4 times a  week  - Fish and lean poultry: at least 2 a week  - Healthy protein sources include, chicken, [place], lean beef, lamb - Increase intake of seafood such as tuna, salmon, trout, mackerel, shrimp, scallops - Avoid or limit high fat processed meats such as sausage and bacon  Dairy - Include moderate amounts of low fat dairy products  - Focus on healthy dairy such as fat free yogurt, skim milk, low or reduced fat cheese - Limit dairy products higher in fat such as whole or 2% milk, cheese, ice cream  Alcohol - Moderate amounts of red wine is ok  - No more than 5 oz daily for women (all ages) and men older than age 27  - No more than 10 oz of wine daily for men younger than 54  Other - Limit sweets and other desserts  - Use herbs and spices instead of salt to flavor foods  - Herbs and spices common to the traditional Mediterranean Diet include: basil, bay leaves, chives, cloves, cumin, fennel, garlic, lavender, marjoram, mint, oregano, parsley, pepper, rosemary, sage, savory, sumac, tarragon, thyme   It's not just a diet, it's a lifestyle:  . The Mediterranean diet includes lifestyle factors typical of those in the region  . Foods, drinks and meals are best eaten with others and savored . Daily physical activity is important for overall good health . This could be strenuous exercise like running and aerobics . This could also be more leisurely activities such as walking, housework, yard-work, or taking the stairs . Moderation is the key; a balanced and healthy diet accommodates most foods and drinks . Consider portion sizes and frequency of consumption of certain foods   Meal Ideas & Options:  . Breakfast:  o Whole wheat toast or whole wheat English muffins with peanut butter & hard boiled egg o Steel cut oats topped with apples & cinnamon and skim milk  o Fresh fruit: banana, strawberries, melon, berries, peaches  o Smoothies: strawberries, bananas, greek yogurt, peanut butter o Low fat  greek yogurt with blueberries and granola  o Egg white omelet with spinach and mushrooms o Breakfast couscous: whole wheat couscous, apricots, skim milk, cranberries  . Sandwiches:  o Hummus and grilled vegetables (peppers, zucchini, squash) on whole wheat bread   o Grilled chicken on whole wheat pita with lettuce, tomatoes, cucumbers or tzatziki  o Tuna salad on whole wheat bread: tuna salad made with greek yogurt, olives, red peppers, capers, green onions o Garlic rosemary lamb pita: lamb sauted with garlic, rosemary, salt & pepper; add lettuce, cucumber, greek yogurt to pita - flavor with lemon juice and black pepper  . Seafood:  o Mediterranean grilled salmon, seasoned with garlic, basil, parsley, lemon juice and black pepper o Shrimp, lemon, and spinach whole-grain pasta salad made with low fat greek yogurt  o Seared scallops with lemon orzo  o Seared tuna steaks seasoned salt, pepper, coriander topped with tomato mixture of olives, tomatoes, olive oil, minced garlic, parsley, green onions and cappers  . Meats:  o Herbed greek chicken salad with kalamata olives, cucumber, feta  o Red bell peppers stuffed with spinach, bulgur, lean ground beef (or lentils) & topped with feta   o Kebabs: skewers of chicken, tomatoes, onions, zucchini, squash  o [place] burgers: made with red onions, mint, dill, lemon juice, feta cheese topped with roasted red peppers . Vegetarian o Cucumber salad: cucumbers, artichoke hearts, celery, red onion, feta cheese, tossed in olive oil & lemon juice  o Hummus and whole grain pita points with a greek salad (lettuce, tomato, feta, olives, cucumbers, red onion) o Lentil soup with celery, carrots made with vegetable broth, garlic, salt and pepper  o Tabouli salad: parsley, bulgur, mint, scallions, cucumbers, tomato, radishes, lemon juice, olive oil, salt and pepper.   Diabetes Mellitus and Standards of Medical Care  Managing diabetes (diabetes mellitus) can be  complicated. Your diabetes treatment may be managed by a team of health care providers, including:  A diet and nutrition specialist (registered dietitian).  A nurse.  A certified diabetes educator (CDE).  A diabetes specialist (endocrinologist).  An eye doctor.  A primary care provider.  A dentist.  Your health care providers follow a schedule in order to help you get the best quality of care. The following schedule is a general guideline for your diabetes management plan. Your health care providers may also give you more specific instructions.  HbA1c (hemoglobin A1c) test This test provides information about blood sugar (glucose) control over the previous 2-3 months. It is used to check whether your diabetes management plan needs to be adjusted.  If you are meeting your treatment goals, this test is done at least 2 times a year.  If you are not meeting treatment goals or if your treatment goals have changed, this test is done 4 times a year.  Blood pressure test  This test is done at every routine medical visit. For most people, the goal is less than 130/80. Ask your health care provider what your goal blood pressure should be.  Dental and eye exams  Visit your dentist two times a year.  If you have type 1 diabetes, get an eye exam 3-5 years after you are diagnosed, and then once a year after your first exam. ? If you were diagnosed with type 1 diabetes as a child, get an eye exam when you are age 77 or older and have had diabetes for 3-5 years. After the first exam, you should get an eye exam once a year.  If you have type 2 diabetes, have an eye exam as soon as you are diagnosed, and then once a year after your first exam.  Foot care exam  Visual foot exams are done at every routine medical visit. The exams check for cuts, bruises, redness, blisters, sores, or other problems with the feet.  A complete foot exam is done by your health care provider once a year. This exam  includes an inspection of the structure and skin of your feet, and a check of the pulses and sensation in your feet. ? Type 1 diabetes: Get your first exam 3-5 years after diagnosis. ? Type 2 diabetes: Get your first exam as soon as you are diagnosed.  Check your feet every day for cuts, bruises, redness, blisters, or sores. If you have any of these or other problems that are not healing, contact your health care provider.  Kidney function test (urine microalbumin)  This test is done once a year. ? Type 1 diabetes: Get your first test 5 years after diagnosis. ? Type 2 diabetes:  Get your first test as soon as you are diagnosed._  If you have chronic kidney disease (CKD), get a serum creatinine and estimated glomerular filtration rate (eGFR) test once a year.  Lipid profile (cholesterol, HDL, LDL, triglycerides)  This test should be done when you are diagnosed with diabetes, and every 5 years after the first test. If you are on medicines to lower your cholesterol, you may need to get this test done every year. ? The goal for LDL is less than 100 mg/dL (5.5 mmol/L). If you are at high risk, the goal is less than 70 mg/dL (3.9 mmol/L). ? The goal for HDL is 40 mg/dL (2.2 mmol/L) for men and 50 mg/dL(2.8 mmol/L) for women. An HDL cholesterol of 60 mg/dL (3.3 mmol/L) or higher gives some protection against heart disease. ? The goal for triglycerides is less than 150 mg/dL (8.3 mmol/L).  Immunizations  The yearly flu (influenza) vaccine is recommended for everyone 6 months or older who has diabetes.  The pneumonia (pneumococcal) vaccine is recommended for everyone 2 years or older who has diabetes. If you are 86 or older, you may get the pneumonia vaccine as a series of two separate shots.  The hepatitis B vaccine is recommended for adults shortly after they have been diagnosed with diabetes.  The Tdap (tetanus, diphtheria, and pertussis) vaccine should be given: ? According to normal  childhood vaccination schedules, for children. ? Every 10 years, for adults who have diabetes.  The shingles vaccine is recommended for people who have had chicken pox and are 50 years or older.  Mental and emotional health  Screening for symptoms of eating disorders, anxiety, and depression is recommended at the time of diagnosis and afterward as needed. If your screening shows that you have symptoms (you have a positive screening result), you may need further evaluation and be referred to a mental health care provider.  Diabetes self-management education  Education about how to manage your diabetes is recommended at diagnosis and ongoing as needed.  Treatment plan  Your treatment plan will be reviewed at every medical visit.  Summary  Managing diabetes (diabetes mellitus) can be complicated. Your diabetes treatment may be managed by a team of health care providers.  Your health care providers follow a schedule in order to help you get the best quality of care.  Standards of care including having regular physical exams, blood tests, blood pressure monitoring, immunizations, screening tests, and education about how to manage your diabetes.  Your health care providers may also give you more specific instructions based on your individual health.      Type 2 Diabetes Mellitus, Self Care, Adult Caring for yourself after you have been diagnosed with type 2 diabetes (type 2 diabetes mellitus) means keeping your blood sugar (glucose) under control with a balance of:  Nutrition.  Exercise.  Lifestyle changes.  Medicines or insulin, if necessary.  Support from your team of health care providers and others.  The following information explains what you need to know to manage your diabetes at home. What do I need to do to manage my blood glucose?  Check your blood glucose every day, as often as told by your health care provider.  Contact your health care provider if your blood  glucose is above your target for 2 tests in a row.  Have your A1c (hemoglobin A1c) level checked at least two times a year, or as often as told by your health care provider. Your health care provider will  set individualized treatment goals for you. Generally, the goal of treatment is to maintain the following blood glucose levels:  Before meals (preprandial): 80-130 mg/dL (4.4-7.2 mmol/L).  After meals (postprandial): below 180 mg/dL (10 mmol/L).  A1c level: less than 7%.  What do I need to know about hyperglycemia and hypoglycemia? What is hyperglycemia? Hyperglycemia, also called high blood glucose, occurs when blood glucose is too high.Make sure you know the early signs of hyperglycemia, such as:  Increased thirst.  Hunger.  Feeling very tired.  Needing to urinate more often than usual.  Blurry vision.  What is hypoglycemia? Hypoglycemia, also called low blood glucose, occurswith a blood glucose level at or below 70 mg/dL (3.9 mmol/L). The risk for hypoglycemia increases during or after exercise, during sleep, during illness, and when skipping meals or not eating for a long time (fasting). It is important to know the symptoms of hypoglycemia and treat it right away. Always have a 15-gram rapid-acting carbohydrate snack with you to treat low blood glucose. Family members and close friends should also know the symptoms and should understand how to treat hypoglycemia, in case you are not able to treat yourself. What are the symptoms of hypoglycemia? Hypoglycemia symptoms can include:  Hunger.  Anxiety.  Sweating and feeling clammy.  Confusion.  Dizziness or feeling light-headed.  Sleepiness.  Nausea.  Increased heart rate.  Headache.  Blurry vision.  Seizure.  Nightmares.  Tingling or numbness around the mouth, lips, or tongue.  A change in speech.  Decreased ability to concentrate.  A change in coordination.  Restless sleep.  Tremors or  shakes.  Fainting.  Irritability.  How do I treat hypoglycemia?  If you are alert and able to swallow safely, follow the 15:15 rule:  Take 15 grams of a rapid-acting carbohydrate. Rapid-acting options include: ? 1 tube of glucose gel. ? 3 glucose pills. ? 6-8 pieces of hard candy. ? 4 oz (120 mL) of fruit juice. ? 4 oz (120 mL) of regular (not diet) soda.  Check your blood glucose 15 minutes after you take the carbohydrate.  If the repeat blood glucose level is still at or below 70 mg/dL (3.9 mmol/L), take 15 grams of a carbohydrate again.  If your blood glucose level does not increase above 70 mg/dL (3.9 mmol/L) after 3 tries, seek emergency medical care.  After your blood glucose level returns to normal, eat a meal or a snack within 1 hour.  How do I treat severe hypoglycemia? Severe hypoglycemia is when your blood glucose level is at or below 54 mg/dL (3 mmol/L). Severe hypoglycemia is an emergency. Do not wait to see if the symptoms will go away. Get medical help right away. Call your local emergency services (911 in the [place] Do not drive yourself to the hospital. If you have severe hypoglycemia and you cannot eat or drink, you may need an injection of glucagon. A family member or close friend should learn how to check your blood glucose and how to give you a glucagon injection. Ask your health care provider if you need to have an emergency glucagon injection kit available. Severe hypoglycemia may need to be treated in a hospital. The treatment may include getting glucose through an IV tube. You may also need treatment for the cause of your hypoglycemia. Can having diabetes put me at risk for other conditions? Having diabetes can put you at risk for other long-term (chronic) conditions, such as heart disease and kidney disease. Your health care provider may  prescribe medicines to help prevent complications from diabetes. These medicines may include:  Aspirin.  Medicine to  lower cholesterol.  Medicine to control blood pressure.  What else can I do to manage my diabetes? Take your diabetes medicines as told  If your health care provider prescribed insulin or diabetes medicines, take them every day.  Do not run out of insulin or other diabetes medicines that you take. Plan ahead so you always have these available.  If you use insulin, adjust your dosage based on how physically active you are and what foods you eat. Your health care provider will tell you how to adjust your dosage. Make healthy food choices  The things that you eat and drink affect your blood glucose and your insulin dosage. Making good choices helps to control your diabetes and prevent other health problems. A healthy meal plan includes eating lean proteins, complex carbohydrates, fresh fruits and vegetables, low-fat dairy products, and healthy fats. Make an appointment to see a diet and nutrition specialist (registered dietitian) to help you create an eating plan that is right for you. Make sure that you:  Follow instructions from your health care provider about eating or drinking restrictions.  Drink enough fluid to keep your urine clear or pale yellow.  Eat healthy snacks between nutritious meals.  Track the carbohydrates that you eat. Do this by reading food labels and learning the standard serving sizes of foods.  Follow your sick day plan whenever you cannot eat or drink as usual. Make this plan in advance with your health care provider.  Stay active  Exercise regularly, as told by your health care provider. This may include:  Stretching and doing strength exercises, such as yoga or weightlifting, at least 2 times a week.  Doing at least 150 minutes of moderate-intensity or vigorous-intensity exercise each week. This could be brisk walking, biking, or water aerobics. ? Spread out your activity over at least 3 days of the week. ? Do not go more than 2 days in a row without doing  some kind of physical activity.  When you start a new exercise or activity, work with your health care provider to adjust your insulin, medicines, or food intake as needed. Make healthy lifestyle choices  Do not use any tobacco products, such as cigarettes, chewing tobacco, and e-cigarettes. If you need help quitting, ask your health care provider.  If your health care provider says that alcohol is safe for you, limit alcohol intake to no more than 1 drink per day for nonpregnant women and 2 drinks per day for men. One drink equals 12 oz of beer, 5 oz of wine, or 1 oz of hard liquor.  Learn to manage stress. If you need help with this, ask your health care provider. Care for your body   Keep your immunizations up to date. In addition to getting vaccinations as told by your health care provider, it is recommended that you get vaccinated against the following illnesses: ? The flu (influenza). Get a flu shot every year. ? Pneumonia. ? Hepatitis B.  Schedule an eye exam soon after your diagnosis, and then one time every year after that.  Check your skin and feet every day for cuts, bruises, redness, blisters, or sores. Schedule a foot exam with your health care provider once every year.  Brush your teeth and gums two times a day, and floss at least one time a day. Visit your dentist at least once every 6 months.  Maintain a  healthy weight. General instructions  Take over-the-counter and prescription medicines only as told by your health care provider.  Share your diabetes management plan with people in your workplace, school, and household.  Check your urine for ketones when you are ill and as told by your health care provider.  Ask your health care provider: ? Do I need to meet with a diabetes educator? ? Where can I find a support group for people with diabetes?  Carry a medical alert card or wear medical alert jewelry.  Keep all follow-up visits as told by your health care  provider. This is important. Where to find more information: For more information about diabetes, visit:  American Diabetes Association (ADA): www.diabetes.org  American Association of Diabetes Educators (AADE): www.diabeteseducator.org/patient-resources  This information is not intended to replace advice given to you by your health care provider. Make sure you discuss any questions you have with your health care provider. Document Released: 11/25/2015 Document Revised: 01/09/2016 Document Reviewed: 09/06/2015 Elsevier Interactive Patient Education  2017 [place].      Blood Glucose Monitoring, Adult Monitoring your blood sugar (glucose) helps you manage your diabetes. It also helps you and your health care provider determine how well your diabetes management plan is working. Blood glucose monitoring involves checking your blood glucose as often as directed, and keeping a record (log) of your results over time. Why should I monitor my blood glucose? Checking your blood glucose regularly can:  Help you understand how food, exercise, illnesses, and medicines affect your blood glucose.  Let you know what your blood glucose is at any time. You can quickly tell if you are having low blood glucose (hypoglycemia) or high blood glucose (hyperglycemia).  Help you and your health care provider adjust your medicines as needed.  When should I check my blood glucose? Follow instructions from your health care provider about how often to check your blood glucose.   This may depend on:  The type of diabetes you have.  How well-controlled your diabetes is.  Medicines you are taking.  If you have type 1 diabetes:  Check your blood glucose at least 2 times a day.  Also check your blood glucose: ? Before every insulin injection. ? Before and after exercise. ? Between meals. ? 2 hours after a meal. ? Occasionally between 2:00 a.m. and 3:00 a.m., as directed. ? Before potentially  dangerous tasks, like driving or using heavy machinery. ? At bedtime.  You may need to check your blood glucose more often, up to 6-10 times a day: ? If you use an insulin pump. ? If you need multiple daily injections (MDI). ? If your diabetes is not well-controlled. ? If you are ill. ? If you have a history of severe hypoglycemia. ? If you have a history of not knowing when your blood glucose is getting low (hypoglycemia unawareness).  If you have type 2 diabetes:  If you take insulin or other diabetes medicines, check your blood glucose at least 2 times a day.  If you are on intensive insulin therapy, check your blood glucose at least 4 times a day. Occasionally, you may also need to check between 2:00 a.m. and 3:00 a.m., as directed.  Also check your blood glucose: ? Before and after exercise. ? Before potentially dangerous tasks, like driving or using heavy machinery.  You may need to check your blood glucose more often if: ? Your medicine is being adjusted. ? Your diabetes is not well-controlled. ? You are ill.  What is a blood glucose log?  A blood glucose log is a record of your blood glucose readings. It helps you and your health care provider: ? Look for patterns in your blood glucose over time. ? Adjust your diabetes management plan as needed.  Every time you check your blood glucose, write down your result and notes about things that may be affecting your blood glucose, such as your diet and exercise for the day.  Most glucose meters store a record of glucose readings in the meter. Some meters allow you to download your records to a computer. How do I check my blood glucose? Follow these steps to get accurate readings of your blood glucose: Supplies needed   Blood glucose meter.  Test strips for your meter. Each meter has its own strips. You must use the strips that come with your meter.  A needle to prick your finger (lancet). Do not use lancets more than  once.  A device that holds the lancet (lancing device).  A journal or log book to write down your results.  Procedure  Wash your hands with soap and water.  Prick the side of your finger (not the tip) with the lancet. Use a different finger each time.  Gently rub the finger until a small drop of blood appears.  Follow instructions that come with your meter for inserting the test strip, applying blood to the strip, and using your blood glucose meter.  Write down your result and any notes.  Alternative testing sites  Some meters allow you to use areas of your body other than your finger (alternative sites) to test your blood.  If you think you may have hypoglycemia, or if you have hypoglycemia unawareness, do not use alternative sites. Use your finger instead.  Alternative sites may not be as accurate as the fingers, because blood flow is slower in these areas. This means that the result you get may be delayed, and it may be different from the result that you would get from your finger.  The most common alternative sites are: ? Forearm. ? Thigh. ? Palm of the hand.  Additional tips  Always keep your supplies with you.  If you have questions or need help, all blood glucose meters have a 24-hour "hotline" number that you can call. You may also contact your health care provider.  After you use a few boxes of test strips, adjust (calibrate) your blood glucose meter by following instructions that came with your meter.    The American Diabetes Association suggests the following targets for most nonpregnant adults with diabetes.  More or less stringent glycemic goals may be appropriate for each individual.  A1C: Less than 7% A1C may also be reported as eAG: Less than 154 mg/dl Before a meal (preprandial plasma glucose): 80-130 mg/dl 1-2 hours after beginning of the meal (Postprandial plasma glucose)*: Less than 180 mg/dl  *Postprandial glucose may be targeted if A1C goals are  not met despite reaching preprandial glucose goals.   GOALS in short:  The goals are for the Hgb A1C to be less than 7.0 & blood pressure to be less than 130/80.    It is recommended that all diabetics are educated on and follow a healthy diabetic diet, exercise for 30 minutes 3-4 times per week (walking, biking, swimming, or machine), monitor blood glucose readings and bring that record with you to be reviewed at your next office visit.     You should be checking fasting blood sugars-  especially after you eat poorly or eat really healthy, and also check 2 hour postprandial blood sugars after largest meal of the day.    Write these down and bring in your log at each office visit.    You will need to be seen every 3 months by the provider managing your Diabetes unless told otherwise by that provider.   You will need yearly eye exams from an eye specialist and foot exams to check the nerves of your feet.  Also, your urine should be checked yearly as well to make sure excess protein is not present.   If you are checking your blood pressure at home, please record it and bring it to your next office visit.    Follow the Dietary Approaches to Stop Hypertension (DASH) diet (3 servings of fruit and vegetables daily, whole grains, low sodium, low-fat proteins).  See below.    Lastly, when it comes to your cholesterol, the goal is to have the HDL (good cholesterol) >40, and the LDL (bad cholesterol) <100.   It is recommended that you follow a heart healthy, low saturated and trans-fat diet and exercise for 30 minutes at least 5 times a week.     (( Check out the DASH diet = 1.5 Gram Low Sodium Diet   A 1.5 gram sodium diet restricts the amount of sodium in the diet to no more than 1.5 g or 1500 mg daily.  The American Heart Association recommends Americans over the age of 89 to consume no more than 1500 mg of sodium each day to reduce the risk of developing high blood pressure.  Research also shows that  limiting sodium may reduce heart attack and stroke risk.  Many foods contain sodium for flavor and sometimes as a preservative.  When the amount of sodium in a diet needs to be low, it is important to know what to look for when choosing foods and drinks.  The following includes some information and guidelines to help make it easier for you to adapt to a low sodium diet.    QUICK TIPS  Do not add salt to food.  Avoid convenience items and fast food.  Choose unsalted snack foods.  Buy lower sodium products, often labeled as "lower sodium" or "no salt added."  Check food labels to learn how much sodium is in 1 serving.  When eating at a restaurant, ask that your food be prepared with less salt or none, if possible.    READING FOOD LABELS FOR SODIUM INFORMATION  The nutrition facts label is a good place to find how much sodium is in foods. Look for products with no more than 400 mg of sodium per serving.  Remember that 1.5 g = 1500 mg.  The food label may also list foods as:  Sodium-free: Less than 5 mg in a serving.  Very low sodium: 35 mg or less in a serving.  Low-sodium: 140 mg or less in a serving.  Light in sodium: 50% less sodium in a serving. For example, if a food that usually has 300 mg of sodium is changed to become light in sodium, it will have 150 mg of sodium.  Reduced sodium: 25% less sodium in a serving. For example, if a food that usually has 400 mg of sodium is changed to reduced sodium, it will have 300 mg of sodium.    CHOOSING FOODS  Grains  Avoid: Salted crackers and snack items. Some cereals, including instant hot cereals. Bread stuffing  and biscuit mixes. Seasoned rice or pasta mixes.  Choose: Unsalted snack items. Low-sodium cereals, oats, puffed wheat and rice, shredded wheat. English muffins and bread. Pasta.  Meats  Avoid: Salted, canned, smoked, spiced, pickled meats, including fish and poultry. Bacon, ham, sausage, cold cuts, hot dogs, anchovies.  Choose:  Low-sodium canned tuna and salmon. Fresh or frozen meat, poultry, and fish.  Dairy  Avoid: Processed cheese and spreads. Cottage cheese. Buttermilk and condensed milk. Regular cheese.  Choose: Milk. Low-sodium cottage cheese. Yogurt. Sour cream. Low-sodium cheese.  Fruits and Vegetables  Avoid: Regular canned vegetables. Regular canned tomato sauce and paste. Frozen vegetables in sauces. Olives. Angie Fava. Relishes. Sauerkraut.  Choose: Low-sodium canned vegetables. Low-sodium tomato sauce and paste. Frozen or fresh vegetables. Fresh and frozen fruit.  Condiments  Avoid: Canned and packaged gravies. Worcestershire sauce. Tartar sauce. Barbecue sauce. Soy sauce. Steak sauce. Ketchup. Onion, garlic, and table salt. Meat flavorings and tenderizers.  Choose: Fresh and dried herbs and spices. Low-sodium varieties of mustard and ketchup. Lemon juice. Tabasco sauce. Horseradish.    SAMPLE 1.5 GRAM SODIUM MEAL PLAN:   Breakfast / Sodium (mg)  1 cup low-fat milk / 143 mg  1 whole-wheat English muffin / 240 mg  1 tbs heart-healthy margarine / 153 mg  1 hard-boiled egg / 139 mg  1 small orange / 0 mg  Lunch / Sodium (mg)  1 cup raw carrots / 76 mg  2 tbs no salt added peanut butter / 5 mg  2 slices whole-wheat bread / 270 mg  1 tbs jelly / 6 mg   cup red grapes / 2 mg  Dinner / Sodium (mg)  1 cup whole-wheat pasta / 2 mg  1 cup low-sodium tomato sauce / 73 mg  3 oz lean ground beef / 57 mg  1 small side salad (1 cup raw spinach leaves,  cup cucumber,  cup yellow bell pepper) with 1 tsp olive oil and 1 tsp red wine vinegar / 25 mg  Snack / Sodium (mg)  1 container low-fat vanilla yogurt / 107 mg  3 graham cracker squares / 127 mg  Nutrient Analysis  Calories: 1745  Protein: 75 g  Carbohydrate: 237 g  Fat: 57 g  Sodium: 1425 mg  Document Released: 08/03/2005 Document Revised: 04/15/2011 Document Reviewed: 11/04/2009  ExitCare Patient Information 2012 [place].))    This  information is not intended to replace advice given to you by your health care provider. Make sure you discuss any questions you have with your health care provider. Document Released: 08/06/2003 Document Revised: 02/21/2016 Document Reviewed: 01/13/2016 Elsevier Interactive Patient Education  2017 [employer].

## 2017-08-03 NOTE — Progress Notes (Signed)
Impression and Recommendations:    1. Hyperlipidemia associated with type 2 diabetes mellitus ([place])   2. Type 2 diabetes mellitus without complication, without long-term current use of insulin ([place])   3. Hypertension associated with diabetes ([place])   4. Physically inactive   5. Morbidly obese ([place])     Plan:   1. DM Medication Management:   -A1c today, 08/03/17 at 7.3 compared to 6.8 on 04/22/2017   -Advised to discontinue Lantus.   -Discussed and recommended the patient to began taking 30 units Basaglar in the evening for his DM.  -Will give Basaglar samples today in the office and will prescribe 10 Basaglar pens with refills.   2. Exercise Management:   -Patient was advised and recommended to start utilizing weight management apps along with starting a walking program.   -Advised and recommended up to 30 minutes of walking each day.     Education and routine counseling performed. Handouts provided.  Orders Placed This Encounter  Procedures  . POCT glycosylated hemoglobin (Hb A1C)     Return for diabetes follow up every 3 mo, sooner if concerns/ problems.   The patient was counseled, risk factors were discussed, anticipatory guidance given.  Gross side effects, risk and benefits, and alternatives of medications discussed with patient.  Patient is aware that all medications have potential side effects and we are unable to predict every side effect or drug-drug interaction that may occur.  Expresses verbal understanding and consents to current therapy plan and treatment regimen.  Please see AVS handed out to patient at the end of our visit for further patient instructions/ counseling done pertaining to today's office visit.    Note: This document was prepared using Dragon voice recognition software and may include unintentional dictation errors.  This document serves as a record of services personally performed by [name], MD. It was created on her behalf  by Steva Colder, a trained medical scribe. The creation of this record is based on the scribe's personal observations and the provider's statements to them.   I have reviewed the above documentation for accuracy and completeness, and I agree with the above.   [name] 08/03/17 8:52 AM   Subjective:    Chief Complaint  Patient presents with  . Follow-up  . Diabetes     Joseph Collier is a 66 y.o. male who presents to [place] [facility] today for Diabetes Management.  He is doing well overall.    DM HPI: -He has not been working on diet and exercise for diabetes. He walks from 2-2.5 miles per day at his place of employment.   -Pt is currently maintained on the following medications for diabetes: see med list today Medication compliance.   -Fasting home glucose readings range is typically from 90's-140. He takes 30 units of Lantus every night.   -He would like to switch from Lantus to another medication due to Lantus being $500. He spoke with his insurance and would like to switch to Basaglar due to it being covered.     Denies polyuria/polydipsia.  Denies hypo/ hyperglycemia symptoms - He denies new onset of: chest pain, exercise intolerance, shortness of breath, dizziness, visual changes, headache, lower extremity swelling or claudication.   Complications:  He denies polyneuropathy, proliferative retinopathy, or proteinuria.  Last diabetic eye exam was  Lab Results  Component Value Date   HMDIABEYEEXA No Retinopathy 05/05/2016    Foot exam- UTD  Last A1C in  the office was:  Lab Results  Component Value Date   HGBA1C 6.8 04/22/2017   HGBA1C 9.9 12/23/2016   HGBA1C 7.5 06/15/2016    Lab Results  Component Value Date   MICROALBUR 30 06/15/2016   LDLCALC 47 04/22/2017   CREATININE 1.04 04/22/2017      Last 3 blood pressure readings in our office are as follows: BP Readings from Last 3 Encounters:  08/03/17 138/86  06/18/17 (!)  154/84  04/22/17 134/82    BMI Readings from Last 3 Encounters:  08/03/17 37.72 kg/m  06/18/17 37.04 kg/m  04/22/17 35.73 kg/m       Patient Care Team    Relationship Specialty Notifications Start End  [name], DO PCP - General Family Medicine  02/27/16      Patient Active Problem List   Diagnosis Date Noted  . Family history of cardiovascular disease 05/24/2015    Priority: High  . Type 2 diabetes mellitus without complication, without long-term current use of insulin ([place]) 05/24/2015    Priority: High  . Hyperlipidemia associated with type 2 diabetes mellitus ([place]) 09/05/2012    Priority: High  . Hypertension associated with diabetes ([place]) 09/05/2012    Priority: High  . Morbidly obese ([place]) 03/07/2016    Priority: Medium  . Physically inactive 12/23/2016    Priority: Low  . History of tobacco abuse- less than 30 pack year history quit 1991 03/07/2016    Priority: Low  . Personal history of colonic polyps 03/07/2016  . Allergic rhinitis 03/07/2016  . History of colonic polyps 05/24/2015  . Erectile dysfunction 05/10/2014  . History of melanoma 02/05/2014  . GERD (gastroesophageal reflux disease) 02/05/2014  . Degenerative disc disease, cervical 10/30/2013  . History of surgical procedure 10/07/2011     Past Medical History:  Diagnosis Date  . Allergic rhinitis, cause unspecified   . Anemia, unspecified   . Arthritis   . Cancer ([place]) 08/17/2006   Melanoma  . Essential hypertension, benign   . Impotence of organic origin 04/14/2007  . Obesity, unspecified   . Other and unspecified hyperlipidemia   . Personal history of colonic polyps   . Skin lesion 04/14/2007   melanoma     Past Surgical History:  Procedure Laterality Date  . APPENDECTOMY    . arthroscopy of knee  2008   Left Knee  . COLONOSCOPY  08/18/2007   normal. [name].  Marland Kitchen HERNIA REPAIR     age 74  . JOINT REPLACEMENT    . melanoma of forearm resection  1992   Left        [name]  .  TONSILLECTOMY AND ADENOIDECTOMY       Family History  Problem Relation Age of Onset  . Coronary artery disease Father   . Hypertension Father   . Heart disease Father        AMI cause of death  . Diabetes Father   . Cancer Mother 65       lung  . Hypertension Brother   . Diabetes Brother      Social History   Substance and Sexual Activity  Drug Use No  ,  Social History   Substance and Sexual Activity  Alcohol Use Yes  . Alcohol/week: 3.0 oz  . Types: 5 Cans of beer per week   Comment: moderate: drinks hard liquor 7 drinks per month in 08/2011/4 drinks 4 a week  ,  Social History   Tobacco Use  Smoking Status Former Smoker  .  Packs/day: 1.00  . Years: 25.00  . Pack years: 25.00  . Types: Cigarettes  . Last attempt to quit: 08/17/1989  . Years since quitting: 27.9  Smokeless Tobacco Never Used  Tobacco Comment   quit 1991  ,    Current Outpatient Medications on File Prior to Visit  Medication Sig Dispense Refill  . aspirin EC 81 MG tablet Take 81 mg by mouth daily.    . B-D ULTRAFINE III SHORT PEN 31G X 8 MM MISC INJECT 1 PEN INTO THE SKIN DAILY.  1  . fluticasone (FLONASE) 50 MCG/ACT nasal spray Place 2 sprays into both nostrils daily.    Marland Kitchen glipiZIDE (GLUCOTROL XL) 10 MG 24 hr tablet TAKE 1 TABLET BY MOUTH EVERY DAY 90 tablet 1  . glucose blood test strip Use as instructed 100 each 12  . Insulin Pen Needle (PEN NEEDLES) 30G X 8 MM MISC Inject 1 pen into the skin daily. 100 each 1  . KOMBIGLYZE XR 2.12-998 MG TB24 TAKE 1 TABLET BY MOUTH TWICE A DAY 180 tablet 1  . lisinopril (PRINIVIL,ZESTRIL) 40 MG tablet TAKE 1 TABLET BY MOUTH EVERY DAY 90 tablet 1  . omeprazole (PRILOSEC) 20 MG capsule TAKE 1 CAPSULE BY MOUTH EVERY DAY 90 capsule 1  . simvastatin (ZOCOR) 20 MG tablet TAKE 1 TABLET BY MOUTH DAILY AT 6 PM. 90 tablet 1  . tadalafil (CIALIS) 20 MG tablet Take 1 tablet (20 mg total) by mouth daily as needed. 10 tablet 11   No current facility-administered  medications on file prior to visit.      Allergies  Allergen Reactions  . Penicillins Hives and Swelling  . Tetracyclines & Related Hives and Swelling     Review of Systems:   General:  Denies fever, chills Optho/Auditory:   Denies visual changes, blurred vision Respiratory:   Denies SOB, cough, wheeze, DIB  Cardiovascular:   Denies chest pain, palpitations, painful respirations Gastrointestinal:   Denies nausea, vomiting, diarrhea.  Endocrine:     Denies new hot or cold intolerance Musculoskeletal:  Denies joint swelling, gait issues, or new unexplained myalgias/ arthralgias Skin:  Denies rash, suspicious lesions  Neurological:    Denies dizziness, unexplained weakness, numbness  Psychiatric/Behavioral:   Denies mood changes    Objective:     Blood pressure 138/86, pulse 64, height 5\' 9"  (1.753 m), weight 255 lb 6.4 oz (115.8 kg), SpO2 98 %.  Body mass index is 37.72 kg/m.  General: Well Developed, well nourished, and in no acute distress.  HEENT: Normocephalic, atraumatic, pupils equal round reactive to light, neck supple, No carotid bruits, no JVD Skin: Warm and dry, cap RF less 2 sec Cardiac: Regular rate and rhythm, S1, S2 WNL's, no murmurs rubs or gallops Respiratory: ECTA B/L, Not using accessory muscles, speaking in full sentences. Rhonchi in right upper posterior lung field cleared with cough.  NeuroM-Sk: Ambulates w/o assistance, moves ext * 4 w/o difficulty, sensation grossly intact.  Ext: scant edema b/l lower ext Psych: No HI/SI, judgement and insight good, Euthymic mood. Full Affect.

## 2017-08-04 ENCOUNTER — Other Ambulatory Visit: Payer: Self-pay | Admitting: Family Medicine

## 2017-11-01 ENCOUNTER — Ambulatory Visit: Payer: 59 | Admitting: Family Medicine

## 2017-11-08 ENCOUNTER — Ambulatory Visit: Payer: 59 | Admitting: Family Medicine

## 2017-11-08 ENCOUNTER — Encounter: Payer: Self-pay | Admitting: Family Medicine

## 2017-11-08 VITALS — BP 137/77 | HR 84 | Resp 16 | Wt 260.0 lb

## 2017-11-08 DIAGNOSIS — Z794 Long term (current) use of insulin: Secondary | ICD-10-CM

## 2017-11-08 DIAGNOSIS — E785 Hyperlipidemia, unspecified: Secondary | ICD-10-CM

## 2017-11-08 DIAGNOSIS — I10 Essential (primary) hypertension: Secondary | ICD-10-CM

## 2017-11-08 DIAGNOSIS — E1169 Type 2 diabetes mellitus with other specified complication: Secondary | ICD-10-CM | POA: Diagnosis not present

## 2017-11-08 DIAGNOSIS — E1159 Type 2 diabetes mellitus with other circulatory complications: Secondary | ICD-10-CM

## 2017-11-08 LAB — POCT UA - MICROALBUMIN
Creatinine, POC: 300 mg/dL
Microalbumin Ur, POC: 30 mg/L

## 2017-11-08 LAB — POCT GLYCOSYLATED HEMOGLOBIN (HGB A1C): Hemoglobin A1C: 7.5

## 2017-11-08 MED ORDER — DULAGLUTIDE 1.5 MG/0.5ML ~~LOC~~ SOAJ: 1.5000 mg | SUBCUTANEOUS | 3 refills | Status: DC

## 2017-11-08 NOTE — Patient Instructions (Signed)
Please use the lose it app and sign up using an email for free.  Please choose you would like to lose 2 pounds per week and please track everything you put in your mouth until the next time I see you.  -Goal is not weight loss but goal is to track everything you eat and this free app.  Please start the Trulicity dulaglutide at 0.5 mg subcu weekly times 2 weeks then go to the higher dose of 1.5 mg weekly

## 2017-11-08 NOTE — Progress Notes (Signed)
Impression and Recommendations:    1. Type 2 diabetes mellitus with other specified complication, with long-term current use of insulin ([place])   2. Hypertension associated with diabetes ([place])   3. Hyperlipidemia associated with type 2 diabetes mellitus ([place])     1. DM2- -start 0.75 subcutaneous trulicity once weekly, to which pt agreed. Told pt this will also help promote weight loss. Discussed with pt potential side effects to this medication. Told pt to not eat out of control once on this medication. -A1c today is 7.5, up from prior on 08-03-17 where it was 7.3. This has been increasing over time. From last OV, we discontinued lantus and started basaglar.  -From 04-22-17, GFR was 76.  -Goal fasting blood sugar should be less than 130-140.  2. HTN- well controlled in office today. Pt asymptomatic. Continue meds as listed below. Goal BP is ideally <130/80.  3. Hyperlipidemia  -Pt asymptomatic. Continue meds.    -Start tracking your daily food intake by using the Lose It app.  -The goal at this time is not necessarily to lose weight, but to be more aware of the foods you are taking in.  -Follow up in 1 month after starting trulicity and tracking your foods with the Lose It app.  -discussed AHA exercise and dietary guidelines.    Education and routine counseling performed. Handouts provided.  Orders Placed This Encounter  Procedures  . POCT HgB A1C  . POCT UA - Microalbumin    Meds ordered this encounter  Medications  . Dulaglutide 1.5 MG/0.5ML SOPN    Sig: Inject 1.5 mg into the skin once a week.    Dispense:  12 pen    Refill:  3    Return for 4wks for wt lose and starting trulicity/ GLP-1.   The patient was counseled, risk factors were discussed, anticipatory guidance given.  Gross side effects, risk and benefits, and alternatives of medications discussed with patient.  Patient is aware that all medications have potential side effects and we are unable to  predict every side effect or drug-drug interaction that may occur.  Expresses verbal understanding and consents to current therapy plan and treatment regimen.  Please see AVS handed out to patient at the end of our visit for further patient instructions/ counseling done pertaining to today's office visit.    Note: This document was prepared using Dragon voice recognition software and may include unintentional dictation errors.  This document serves as a record of services personally performed by [name], DO. It was created on her behalf by Mayer Masker, a trained medical scribe. The creation of this record is based on the scribe's personal observations and the provider's statements to them.   I have reviewed the above medical documentation for accuracy and completeness and I concur.  [name] 11/08/17 5:00 PM    Subjective:    Chief Complaint  Patient presents with  . Diabetes        Joseph Collier is a 67 y.o. male who presents to [place] at [facility] today for Diabetes Management.     DM HPI: A1c today is 7.5, increased from prior where it was 7.3. From last OV, we d/c'd lantus and started basaglar.   He takes 30 units of insulin every night.    -  He has not been working on diet and exercise for diabetes. He has gained 11 lbs since last visit. He walks regularly, but sometimes does not meet  AHA guidelines.   Pt is currently maintained on the following medications for diabetes:   see med list today. Pt was started on insulin after A1c being 9.9.  Medication compliance - he is compliant.  Home glucose readings range: fasting blood sugars have been between 108-187.    Denies polyuria/polydipsia. Denies hypo/ hyperglycemia symptoms - He denies new onset of: chest pain, exercise intolerance, shortness of breath, dizziness, visual changes, headache, lower extremity swelling or claudication.   Last diabetic eye exam was  Lab Results    Component Value Date   HMDIABEYEEXA No Retinopathy 05/05/2016    Foot exam- UTD  Last A1C in the office was:  Lab Results  Component Value Date   HGBA1C 7.5 11/08/2017   HGBA1C 7.3 08/03/2017   HGBA1C 6.8 04/22/2017    Lab Results  Component Value Date   MICROALBUR 30 11/08/2017   LDLCALC 47 04/22/2017   CREATININE 1.04 04/22/2017      Last 3 blood pressure readings in our office are as follows: BP Readings from Last 3 Encounters:  11/08/17 137/77  08/03/17 138/86  06/18/17 (!) 154/84    BMI Readings from Last 3 Encounters:  11/08/17 38.40 kg/m  08/03/17 37.72 kg/m  06/18/17 37.04 kg/m     Problem  Diabetes Mellitus, Type II ([facility])      Patient Care Team    Relationship Specialty Notifications Start End  [name], DO PCP - General Family Medicine  02/27/16      Patient Active Problem List   Diagnosis Date Noted  . Family history of cardiovascular disease 05/24/2015    Priority: High  . Diabetes mellitus, type II ([place]) 05/24/2015    Priority: High  . Hyperlipidemia associated with type 2 diabetes mellitus ([place]) 09/05/2012    Priority: High  . Hypertension associated with diabetes ([place]) 09/05/2012    Priority: High  . Morbidly obese ([place]) 03/07/2016    Priority: Medium  . Physically inactive 12/23/2016    Priority: Low  . History of tobacco abuse- less than 30 pack year history quit 1991 03/07/2016    Priority: Low  . Personal history of colonic polyps 03/07/2016  . Allergic rhinitis 03/07/2016  . History of colonic polyps 05/24/2015  . Erectile dysfunction 05/10/2014  . History of melanoma 02/05/2014  . GERD (gastroesophageal reflux disease) 02/05/2014  . Degenerative disc disease, cervical 10/30/2013  . History of surgical procedure 10/07/2011     Past Medical History:  Diagnosis Date  . Allergic rhinitis, cause unspecified   . Anemia, unspecified   . Arthritis   . Cancer ([place]) 08/17/2006   Melanoma  . Essential hypertension,  benign   . Impotence of organic origin 04/14/2007  . Obesity, unspecified   . Other and unspecified hyperlipidemia   . Personal history of colonic polyps   . Skin lesion 04/14/2007   melanoma     Past Surgical History:  Procedure Laterality Date  . APPENDECTOMY    . arthroscopy of knee  2008   Left Knee  . COLONOSCOPY  08/18/2007   normal. [name].  Marland Kitchen HERNIA REPAIR     age 11  . JOINT REPLACEMENT    . melanoma of forearm resection  1992   Left        [name]  . TONSILLECTOMY AND ADENOIDECTOMY       Family History  Problem Relation Age of Onset  . Coronary artery disease Father   . Hypertension Father   . Heart disease Father  AMI cause of death  . Diabetes Father   . Cancer Mother 22       lung  . Hypertension Brother   . Diabetes Brother      Social History   Substance and Sexual Activity  Drug Use No  ,  Social History   Substance and Sexual Activity  Alcohol Use Yes  . Alcohol/week: 3.0 oz  . Types: 5 Cans of beer per week   Comment: moderate: drinks hard liquor 7 drinks per month in 08/2011/4 drinks 4 a week  ,  Social History   Tobacco Use  Smoking Status Former Smoker  . Packs/day: 1.00  . Years: 25.00  . Pack years: 25.00  . Types: Cigarettes  . Last attempt to quit: 08/17/1989  . Years since quitting: 28.2  Smokeless Tobacco Never Used  Tobacco Comment   quit 1991  ,    Current Outpatient Medications on File Prior to Visit  Medication Sig Dispense Refill  . aspirin EC 81 MG tablet Take 81 mg by mouth daily.    . B-D ULTRAFINE III SHORT PEN 31G X 8 MM MISC INJECT 1 PEN INTO THE SKIN DAILY.  1  . fluticasone (FLONASE) 50 MCG/ACT nasal spray Place 2 sprays into both nostrils daily.    Marland Kitchen glipiZIDE (GLUCOTROL XL) 10 MG 24 hr tablet TAKE 1 TABLET BY MOUTH EVERY DAY 90 tablet 1  . glucose blood test strip Use as instructed 100 each 12  . Insulin Glargine (BASAGLAR KWIKPEN) 100 UNIT/ML SOPN Inject 0.3 mLs (30 Units total) into the skin at  bedtime. 10 pen 3  . Insulin Pen Needle (PEN NEEDLES) 30G X 8 MM MISC Inject 1 pen into the skin daily. 100 each 1  . KOMBIGLYZE XR 2.12-998 MG TB24 TAKE 1 TABLET BY MOUTH TWICE A DAY 180 tablet 1  . lisinopril (PRINIVIL,ZESTRIL) 40 MG tablet TAKE 1 TABLET BY MOUTH EVERY DAY 90 tablet 1  . omeprazole (PRILOSEC) 20 MG capsule TAKE 1 CAPSULE BY MOUTH EVERY DAY 90 capsule 1  . simvastatin (ZOCOR) 20 MG tablet TAKE 1 TABLET BY MOUTH DAILY AT 6 PM. 90 tablet 1  . tadalafil (CIALIS) 20 MG tablet Take 1 tablet (20 mg total) by mouth daily as needed. 10 tablet 11   No current facility-administered medications on file prior to visit.      Allergies  Allergen Reactions  . Penicillins Hives and Swelling  . Tetracyclines & Related Hives and Swelling     Review of Systems:   General:  Denies fever, chills Optho/Auditory:   Denies visual changes, blurred vision Respiratory:   Denies SOB, cough, wheeze, DIB  Cardiovascular:   Denies chest pain, palpitations, painful respirations Gastrointestinal:   Denies nausea, vomiting, diarrhea.  Endocrine:     Denies new hot or cold intolerance Musculoskeletal:  Denies joint swelling, gait issues, or new unexplained myalgias/ arthralgias Skin:  Denies rash, suspicious lesions  Neurological:    Denies dizziness, unexplained weakness, numbness  Psychiatric/Behavioral:   Denies mood changes    Objective:     Blood pressure 137/77, pulse 84, resp. rate 16, weight 260 lb (117.9 kg), SpO2 97 %.  Body mass index is 38.4 kg/m.  General: Well Developed, well nourished, and in no acute distress.  HEENT: Normocephalic, atraumatic, pupils equal round reactive to light, neck supple, No carotid bruits, no JVD Skin: Warm and dry, cap RF less 2 sec Cardiac: Regular rate and rhythm, S1, S2 WNL's, no murmurs rubs or gallops Respiratory:  ECTA B/L, Not using accessory muscles, speaking in full sentences. NeuroM-Sk: Ambulates w/o assistance, moves ext * 4 w/o  difficulty, sensation grossly intact.  Ext: scant edema b/l lower ext Psych: No HI/SI, judgement and insight good, Euthymic mood. Full Affect.

## 2017-12-13 ENCOUNTER — Encounter: Payer: Self-pay | Admitting: Family Medicine

## 2017-12-13 ENCOUNTER — Ambulatory Visit: Payer: 59 | Admitting: Family Medicine

## 2017-12-13 VITALS — BP 124/77 | HR 71 | Ht 69.0 in | Wt 250.8 lb

## 2017-12-13 DIAGNOSIS — E785 Hyperlipidemia, unspecified: Secondary | ICD-10-CM | POA: Diagnosis not present

## 2017-12-13 DIAGNOSIS — M62838 Other muscle spasm: Secondary | ICD-10-CM | POA: Diagnosis not present

## 2017-12-13 DIAGNOSIS — M549 Dorsalgia, unspecified: Secondary | ICD-10-CM

## 2017-12-13 DIAGNOSIS — I152 Hypertension secondary to endocrine disorders: Secondary | ICD-10-CM

## 2017-12-13 DIAGNOSIS — E1159 Type 2 diabetes mellitus with other circulatory complications: Secondary | ICD-10-CM

## 2017-12-13 DIAGNOSIS — E119 Type 2 diabetes mellitus without complications: Secondary | ICD-10-CM | POA: Diagnosis not present

## 2017-12-13 DIAGNOSIS — Z794 Long term (current) use of insulin: Secondary | ICD-10-CM

## 2017-12-13 DIAGNOSIS — I10 Essential (primary) hypertension: Secondary | ICD-10-CM

## 2017-12-13 DIAGNOSIS — E1169 Type 2 diabetes mellitus with other specified complication: Secondary | ICD-10-CM

## 2017-12-13 MED ORDER — BASAGLAR KWIKPEN 100 UNIT/ML ~~LOC~~ SOPN: PEN_INJECTOR | SUBCUTANEOUS | 3 refills | Status: DC

## 2017-12-13 MED ORDER — CYCLOBENZAPRINE HCL 10 MG PO TABS: 10.0000 mg | ORAL_TABLET | Freq: Three times a day (TID) | ORAL | 0 refills | Status: DC | PRN

## 2017-12-13 NOTE — Patient Instructions (Signed)
Since her blood sugars are doing so well please go to half of your Basaglar dose from 30 units nightly to 15 use units nightly.  Please continue to check your blood sugars and if they remain less than 120 fasting and less than 182 hours postprandial please after couple weeks try to go completely off it and see how your blood sugars are doing.  Please continue on your other 3 diabetes medicines though.  -Great job and he on tracking your foods and fantastic weight loss over the past 3 weeks.  Keep up the great work!!!!     Please realize, EXERCISE IS MEDICINE!  -  American Heart Association ( AHA) guidelines for exercise : If you are in good health, without any medical conditions, you should engage in 150 minutes of moderate intensity aerobic activity per week.  This means you should be huffing and puffing throughout your workout.   Engaging in regular exercise will improve brain function and memory, as well as improve mood, boost immune system and help with weight management.  As well as the other, more well-known effects of exercise such as decreasing blood sugar levels, decreasing blood pressure,  and decreasing bad cholesterol levels/ increasing good cholesterol levels.     -  The AHA strongly endorses consumption of a diet that contains a variety of foods from all the food categories with an emphasis on fruits and vegetables; fat-free and low-fat dairy products; cereal and grain products; legumes and nuts; and fish, poultry, and/or extra lean meats.    Excessive food intake, especially of foods high in saturated and trans fats, sugar, and salt, should be avoided.    Adequate water intake of roughly 1/2 of your weight in pounds, should equal the ounces of water per day you should drink.  So for instance, if you're 200 pounds, that would be 100 ounces of water per day.         Mediterranean Diet  Why follow it? Research shows. . Those who follow the Mediterranean diet have a reduced risk of  heart disease  . The diet is associated with a reduced incidence of Parkinson's and Alzheimer's diseases . People following the diet may have longer life expectancies and lower rates of chronic diseases  . The Dietary Guidelines for Americans recommends the Mediterranean diet as an eating plan to promote health and prevent disease  What Is the Mediterranean Diet?  . Healthy eating plan based on typical foods and recipes of Mediterranean-style cooking . The diet is primarily a plant based diet; these foods should make up a majority of meals   Starches - Plant based foods should make up a majority of meals - They are an important sources of vitamins, minerals, energy, antioxidants, and fiber - Choose whole grains, foods high in fiber and minimally processed items  - Typical grain sources include wheat, oats, barley, corn, brown rice, bulgar, farro, millet, polenta, couscous  - Various types of beans include chickpeas, lentils, fava beans, black beans, white beans   Fruits  Veggies - Large quantities of antioxidant rich fruits & veggies; 6 or more servings  - Vegetables can be eaten raw or lightly drizzled with oil and cooked  - Vegetables common to the traditional Mediterranean Diet include: artichokes, arugula, beets, broccoli, brussel sprouts, cabbage, carrots, celery, collard greens, cucumbers, eggplant, kale, leeks, lemons, lettuce, mushrooms, okra, onions, peas, peppers, potatoes, pumpkin, radishes, rutabaga, shallots, spinach, sweet potatoes, turnips, zucchini - Fruits common to the Mediterranean Diet include: apples, apricots,  avocados, cherries, clementines, dates, figs, grapefruits, grapes, melons, nectarines, oranges, peaches, pears, pomegranates, strawberries, tangerines  Fats - Replace butter and margarine with healthy oils, such as olive oil, canola oil, and tahini  - Limit nuts to no more than a handful a day  - Nuts include walnuts, almonds, pecans, pistachios, pine nuts  - Limit or  avoid candied, honey roasted or heavily salted nuts - Olives are central to the Mediterranean diet - can be eaten whole or used in a variety of dishes   Meats Protein - Limiting red meat: no more than a few times a month - When eating red meat: choose lean cuts and keep the portion to the size of deck of cards - Eggs: approx. 0 to 4 times a week  - Fish and lean poultry: at least 2 a week  - Healthy protein sources include, chicken, [place], lean beef, lamb - Increase intake of seafood such as tuna, salmon, trout, mackerel, shrimp, scallops - Avoid or limit high fat processed meats such as sausage and bacon  Dairy - Include moderate amounts of low fat dairy products  - Focus on healthy dairy such as fat free yogurt, skim milk, low or reduced fat cheese - Limit dairy products higher in fat such as whole or 2% milk, cheese, ice cream  Alcohol - Moderate amounts of red wine is ok  - No more than 5 oz daily for women (all ages) and men older than age 86  - No more than 10 oz of wine daily for men younger than 57  Other - Limit sweets and other desserts  - Use herbs and spices instead of salt to flavor foods  - Herbs and spices common to the traditional Mediterranean Diet include: basil, bay leaves, chives, cloves, cumin, fennel, garlic, lavender, marjoram, mint, oregano, parsley, pepper, rosemary, sage, savory, sumac, tarragon, thyme   It's not just a diet, it's a lifestyle:  . The Mediterranean diet includes lifestyle factors typical of those in the region  . Foods, drinks and meals are best eaten with others and savored . Daily physical activity is important for overall good health . This could be strenuous exercise like running and aerobics . This could also be more leisurely activities such as walking, housework, yard-work, or taking the stairs . Moderation is the key; a balanced and healthy diet accommodates most foods and drinks . Consider portion sizes and frequency of consumption of  certain foods   Meal Ideas & Options:  . Breakfast:  o Whole wheat toast or whole wheat English muffins with peanut butter & hard boiled egg o Steel cut oats topped with apples & cinnamon and skim milk  o Fresh fruit: banana, strawberries, melon, berries, peaches  o Smoothies: strawberries, bananas, greek yogurt, peanut butter o Low fat greek yogurt with blueberries and granola  o Egg white omelet with spinach and mushrooms o Breakfast couscous: whole wheat couscous, apricots, skim milk, cranberries  . Sandwiches:  o Hummus and grilled vegetables (peppers, zucchini, squash) on whole wheat bread   o Grilled chicken on whole wheat pita with lettuce, tomatoes, cucumbers or tzatziki  o Tuna salad on whole wheat bread: tuna salad made with greek yogurt, olives, red peppers, capers, green onions o Garlic rosemary lamb pita: lamb sauted with garlic, rosemary, salt & pepper; add lettuce, cucumber, greek yogurt to pita - flavor with lemon juice and black pepper  . Seafood:  o Mediterranean grilled salmon, seasoned with garlic, basil, parsley, lemon juice and  black pepper o Shrimp, lemon, and spinach whole-grain pasta salad made with low fat greek yogurt  o Seared scallops with lemon orzo  o Seared tuna steaks seasoned salt, pepper, coriander topped with tomato mixture of olives, tomatoes, olive oil, minced garlic, parsley, green onions and cappers  . Meats:  o Herbed greek chicken salad with kalamata olives, cucumber, feta  o Red bell peppers stuffed with spinach, bulgur, lean ground beef (or lentils) & topped with feta   o Kebabs: skewers of chicken, tomatoes, onions, zucchini, squash  o [place] burgers: made with red onions, mint, dill, lemon juice, feta cheese topped with roasted red peppers . Vegetarian o Cucumber salad: cucumbers, artichoke hearts, celery, red onion, feta cheese, tossed in olive oil & lemon juice  o Hummus and whole grain pita points with a greek salad (lettuce, tomato, feta,  olives, cucumbers, red onion) o Lentil soup with celery, carrots made with vegetable broth, garlic, salt and pepper  o Tabouli salad: parsley, bulgur, mint, scallions, cucumbers, tomato, radishes, lemon juice, olive oil, salt and pepper.

## 2017-12-13 NOTE — Progress Notes (Signed)
-Chronic follow-up OV   Impression and Recommendations:    1. Type 2 diabetes mellitus with other specified complication, with long-term current use of insulin (Joseph Collier)   2. Hypertension associated with diabetes (Joseph Collier)   3. Hyperlipidemia associated with type 2 diabetes mellitus (Joseph Collier)   4. Type 2 diabetes mellitus without complication, without long-term current use of insulin (Joseph Collier)   5. Muscle spasm   6. Acute midline back pain, unspecified back location   7. Morbidly obese (Joseph Collier)     1. Diabetes Mellitus Maintained on Glipizide, Basaglar, Dulaglutide, and Kombiglyze.  -Since blood sugars at home are under very good control and even some lows we will try to wean off basal insulin.   -Go down to half of the dose of Basaglar, 15 units nightly for 2-3 weeks.  Monitor closely. - Check sugars regularly.  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  - Keeping current dose of all 3 other diabetes meds  -Continue Trulicity, which we started last appointment 11/08/2017 with goal of weight loss. Patient tolerates medicine well with 10 lbs of weight loss since last appointment (4 weeks ago).  - Goal blood sugars should be under 120 consistently while fasting, and under 180 postprandial.  - A1c 11/08/2017 was 7.5, up from prior 7.3 on 08/03/2017. This has been increasing over time. - Due for re-check around 02/08/2018.  - Last GFR was 76 on 04/22/2017.  2. Hypertension - Blood pressure well controlled in office today.  - Patient asymptomatic.  Continue meds as listed below.   - Goal BP is ideally <130/80.  3. Hyperlipidemia  - Pt asymptomatic. Continue meds.   4. General Health Maintenance - Continue tracking daily food intake by using the Lose It app.  Patient's goal is to continue with weight loss. - Continue to be consistent with dietary habits and aware of caloric intake.  - Advised patient to continue working toward  exercising to improve health.  Emphasized the critical importance of continued exercise maintenance while diabetic.  - Patient will begin with 20 minutes of cardiovascular activity daily.  Recommended that the patient eventually strive for at least 150 minutes of cardio per week according to guidelines established by the [facility].   - Healthy dietary habits encouraged, including low-carb, and high amounts of lean protein in diet.   - Patient should also consume adequate amounts of water - half of body weight in oz of water per day  Acute Back Pull (Muscle Spasm) - Muscle relaxer (flexeril) prescribed today for recent back pull in [place].  He has used this medicine in the past and tolerated it well. - Advised use three times daily if he tolerated-drowsiness during daytime was discussed. - Patient knows to let us know if his pain does not improve.  5. Follow-Up - Next visit around 02/08/2018 will check CMP, Lipase, A1c.  - Patient will continue eating healthfully, move more, and continue medications as instructed.   Education and routine counseling performed. Handouts provided.  Orders Placed This Encounter  Procedures  . Comprehensive metabolic panel  . Hemoglobin A1c  . Lipase    Meds ordered this encounter  Medications  . DISCONTD: Insulin Glargine (BASAGLAR KWIKPEN) 100 UNIT/ML SOPN    Sig: Patient told to go to 15 units nightly for 2 to 3 weeks and then off as blood sugar allows if at goal    Dispense:  10 pen    Refill:  3  . cyclobenzaprine (  FLEXERIL) 10 MG tablet    Sig: Take 1 tablet (10 mg total) by mouth 3 (three) times daily as needed for muscle spasms.    Dispense:  30 tablet    Refill:  0  . Insulin Glargine (BASAGLAR KWIKPEN) 100 UNIT/ML SOPN    Sig: Patient told to go to 15 units nightly for 2 to 3 weeks and then off as blood sugar allows if at goal    Dispense:  10 pen    Refill:  3    Return for Chronic OV w me around end June & BW 2-3d prior.   The patient  was counseled, risk factors were discussed, anticipatory guidance given.  Gross side effects, risk and benefits, and alternatives of medications discussed with patient.  Patient is aware that all medications have potential side effects and we are unable to predict every side effect or drug-drug interaction that may occur.  Expresses verbal understanding and consents to current therapy plan and treatment regimen.  Please see AVS handed out to patient at the end of our visit for further patient instructions/ counseling done pertaining to today's office visit.    Note: This document was prepared using Dragon voice recognition software and may include unintentional dictation errors.  This document serves as a record of services personally performed by [name], DO. It was created on her behalf by Toni Amend, a trained medical scribe. The creation of this record is based on the scribe's personal observations and the provider's statements to them.   I have reviewed the above medical documentation for accuracy and completeness and I concur.  [name] 12/13/17 5:37 PM    Subjective:    Chief Complaint  Patient presents with  . Follow-up    Joseph Collier is a 67 y.o. male who presents to [place] at [facility] today for Diabetes Management.    Patient is limping today due to increased activity over the weekend with son.  They went on a trip to explore [place].  Patient notes that he threw his back out during the trip.  10 lbs weight loss since 3/25.  DM HPI: -  He has been working on diet and exercise for diabetes Tries to eat salad before he eats anything else, and has been walking more often. He has been using the [employer] app to track his intake for the most part.  Pt is currently maintained on the following medications for diabetes:  see med list today Medication compliance - has continued on dulaglutide (trulicity), glipizide, and Basaglar.  On  1.5 mg once-weekly dulaglutide and is tolerating the medicine without problems.  Denies nausea.    Notes that it seems like his blood sugars run low if he's not careful.   "I can feel it coming on, so I generally stop and eat grapes at home." He is not as hungry on the Trulicity, but still eating something all the time.  Home glucose readings range: In the past in the mornings, his fasting sugars usually ran in the 130's up to the 170's Since adding Trulicity to his treatment, it is mostly in the 90's, and never over 107.  3 days after he started, his sugar was 212, but this was anomalous.   Denies polyuria/polydipsia. Denies hypo/ hyperglycemia symptoms - He denies new onset of: chest pain, exercise intolerance, shortness of breath, dizziness, visual changes, headache, lower extremity swelling or claudication.   Last diabetic eye exam was  Lab Results  Component  Value Date   HMDIABEYEEXA No Retinopathy 05/05/2016    Foot exam- UTD  Last A1C in the office was:  Lab Results  Component Value Date   HGBA1C 7.5 11/08/2017   HGBA1C 7.3 08/03/2017   HGBA1C 6.8 04/22/2017    Lab Results  Component Value Date   MICROALBUR 30 11/08/2017   LDLCALC 47 04/22/2017   CREATININE 1.04 04/22/2017     Last 3 blood pressure readings in our office are as follows: BP Readings from Last 3 Encounters:  12/13/17 124/77  11/08/17 137/77  08/03/17 138/86    BMI Readings from Last 3 Encounters:  12/13/17 37.04 kg/m  11/08/17 38.40 kg/m  08/03/17 37.72 kg/m     No problems updated.    Patient Care Team    Relationship Specialty Notifications Start End  [name], DO PCP - General Family Medicine  02/27/16      Patient Active Problem List   Diagnosis Date Noted  . Family history of cardiovascular disease 05/24/2015    Priority: High  . Diabetes mellitus, type II ([place]) 05/24/2015    Priority: High  . Hyperlipidemia associated with type 2 diabetes mellitus ([place])  09/05/2012    Priority: High  . Hypertension associated with diabetes ([place]) 09/05/2012    Priority: High  . Morbidly obese ([place]) 03/07/2016    Priority: Medium  . Physically inactive 12/23/2016    Priority: Low  . History of tobacco abuse- less than 30 pack year history quit 1991 03/07/2016    Priority: Low  . Personal history of colonic polyps 03/07/2016  . Allergic rhinitis 03/07/2016  . History of colonic polyps 05/24/2015  . Erectile dysfunction 05/10/2014  . History of melanoma 02/05/2014  . GERD (gastroesophageal reflux disease) 02/05/2014  . Degenerative disc disease, cervical 10/30/2013  . History of surgical procedure 10/07/2011     Past Medical History:  Diagnosis Date  . Allergic rhinitis, cause unspecified   . Anemia, unspecified   . Arthritis   . Cancer ([place]) 08/17/2006   Melanoma  . Essential hypertension, benign   . Impotence of organic origin 04/14/2007  . Obesity, unspecified   . Other and unspecified hyperlipidemia   . Personal history of colonic polyps   . Skin lesion 04/14/2007   melanoma     Past Surgical History:  Procedure Laterality Date  . APPENDECTOMY    . arthroscopy of knee  2008   Left Knee  . COLONOSCOPY  08/18/2007   normal. [name].  Marland Kitchen HERNIA REPAIR     age 36  . JOINT REPLACEMENT    . melanoma of forearm resection  1992   Left        [name]  . TONSILLECTOMY AND ADENOIDECTOMY       Family History  Problem Relation Age of Onset  . Coronary artery disease Father   . Hypertension Father   . Heart disease Father        AMI cause of death  . Diabetes Father   . Cancer Mother 74       lung  . Hypertension Brother   . Diabetes Brother      Social History   Substance and Sexual Activity  Drug Use No  ,  Social History   Substance and Sexual Activity  Alcohol Use Yes  . Alcohol/week: 3.0 oz  . Types: 5 Cans of beer per week   Comment: moderate: drinks hard liquor 7 drinks per month in 08/2011/4 drinks 4 a week  ,  Social  History   Tobacco Use  Smoking Status Former Smoker  . Packs/day: 1.00  . Years: 25.00  . Pack years: 25.00  . Types: Cigarettes  . Last attempt to quit: 08/17/1989  . Years since quitting: 28.3  Smokeless Tobacco Never Used  Tobacco Comment   quit 1991  ,    Current Outpatient Medications on File Prior to Visit  Medication Sig Dispense Refill  . aspirin EC 81 MG tablet Take 81 mg by mouth daily.    . B-D ULTRAFINE III SHORT PEN 31G X 8 MM MISC INJECT 1 PEN INTO THE SKIN DAILY.  1  . Dulaglutide 1.5 MG/0.5ML SOPN Inject 1.5 mg into the skin once a week. 12 pen 3  . fluticasone (FLONASE) 50 MCG/ACT nasal spray Place 2 sprays into both nostrils daily.    Marland Kitchen glipiZIDE (GLUCOTROL XL) 10 MG 24 hr tablet TAKE 1 TABLET BY MOUTH EVERY DAY 90 tablet 1  . glucose blood test strip Use as instructed 100 each 12  . Insulin Pen Needle (PEN NEEDLES) 30G X 8 MM MISC Inject 1 pen into the skin daily. 100 each 1  . KOMBIGLYZE XR 2.12-998 MG TB24 TAKE 1 TABLET BY MOUTH TWICE A DAY 180 tablet 1  . lisinopril (PRINIVIL,ZESTRIL) 40 MG tablet TAKE 1 TABLET BY MOUTH EVERY DAY 90 tablet 1  . omeprazole (PRILOSEC) 20 MG capsule TAKE 1 CAPSULE BY MOUTH EVERY DAY 90 capsule 1  . simvastatin (ZOCOR) 20 MG tablet TAKE 1 TABLET BY MOUTH DAILY AT 6 PM. 90 tablet 1  . tadalafil (CIALIS) 20 MG tablet Take 1 tablet (20 mg total) by mouth daily as needed. 10 tablet 11   No current facility-administered medications on file prior to visit.      Allergies  Allergen Reactions  . Penicillins Hives and Swelling  . Tetracyclines & Related Hives and Swelling     Review of Systems:   General:  Denies fever, chills Optho/Auditory:   Denies visual changes, blurred vision Respiratory:   Denies SOB, cough, wheeze, DIB  Cardiovascular:   Denies chest pain, palpitations, painful respirations Gastrointestinal:   Denies nausea, vomiting, diarrhea.  Endocrine:     Denies new hot or cold intolerance Musculoskeletal:  Denies  joint swelling, gait issues, or new unexplained myalgias/ arthralgias Skin:  Denies rash, suspicious lesions  Neurological:    Denies dizziness, unexplained weakness, numbness  Psychiatric/Behavioral:   Denies mood changes    Objective:     Blood pressure 124/77, pulse 71, height 5\' 9"  (1.753 m), weight 250 lb 12.8 oz (113.8 kg), SpO2 98 %.  Body mass index is 37.04 kg/m.  General: Well Developed, well nourished, and in no acute distress.  HEENT: Normocephalic, atraumatic, pupils equal round reactive to light, neck supple, No carotid bruits, no JVD Skin: Warm and dry, cap RF less 2 sec Cardiac: Regular rate and rhythm, S1, S2 WNL's, no murmurs rubs or gallops Respiratory: ECTA B/L, Not using accessory muscles, speaking in full sentences. NeuroM-Sk: Ambulates w/o assistance, moves ext * 4 w/o difficulty, sensation grossly intact.  Ext: scant edema b/l lower ext Psych: No HI/SI, judgement and insight good, Euthymic mood. Full Affect.

## 2018-01-11 ENCOUNTER — Encounter: Payer: Self-pay | Admitting: Family Medicine

## 2018-01-24 ENCOUNTER — Other Ambulatory Visit: Payer: Self-pay | Admitting: Family Medicine

## 2018-01-24 DIAGNOSIS — E119 Type 2 diabetes mellitus without complications: Secondary | ICD-10-CM

## 2018-01-24 DIAGNOSIS — K219 Gastro-esophageal reflux disease without esophagitis: Secondary | ICD-10-CM

## 2018-01-24 DIAGNOSIS — E7800 Pure hypercholesterolemia, unspecified: Secondary | ICD-10-CM

## 2018-01-28 ENCOUNTER — Other Ambulatory Visit: Payer: Self-pay | Admitting: Family Medicine

## 2018-02-03 ENCOUNTER — Other Ambulatory Visit (INDEPENDENT_AMBULATORY_CARE_PROVIDER_SITE_OTHER): Payer: Medicare Other

## 2018-02-03 DIAGNOSIS — E785 Hyperlipidemia, unspecified: Secondary | ICD-10-CM

## 2018-02-03 DIAGNOSIS — E1169 Type 2 diabetes mellitus with other specified complication: Secondary | ICD-10-CM

## 2018-02-03 DIAGNOSIS — E1159 Type 2 diabetes mellitus with other circulatory complications: Secondary | ICD-10-CM | POA: Diagnosis not present

## 2018-02-03 DIAGNOSIS — Z794 Long term (current) use of insulin: Secondary | ICD-10-CM

## 2018-02-03 DIAGNOSIS — I10 Essential (primary) hypertension: Secondary | ICD-10-CM | POA: Diagnosis not present

## 2018-02-04 LAB — COMPREHENSIVE METABOLIC PANEL
A/G RATIO: 2.2 (ref 1.2–2.2)
ALBUMIN: 4.8 g/dL (ref 3.6–4.8)
ALK PHOS: 45 IU/L (ref 39–117)
ALT: 28 IU/L (ref 0–44)
AST: 18 IU/L (ref 0–40)
BILIRUBIN TOTAL: 0.5 mg/dL (ref 0.0–1.2)
BUN / CREAT RATIO: 17 (ref 10–24)
BUN: 16 mg/dL (ref 8–27)
CHLORIDE: 99 mmol/L (ref 96–106)
CO2: 24 mmol/L (ref 20–29)
Calcium: 10 mg/dL (ref 8.6–10.2)
Creatinine, Ser: 0.92 mg/dL (ref 0.76–1.27)
GFR calc Af Amer: 101 mL/min/{1.73_m2} (ref >59)
GFR calc non Af Amer: 87 mL/min/{1.73_m2} (ref >59)
GLOBULIN, TOTAL: 2.2 g/dL (ref 1.5–4.5)
Glucose: 87 mg/dL (ref 65–99)
Potassium: 5.6 mmol/L — ABNORMAL HIGH (ref 3.5–5.2)
SODIUM: 136 mmol/L (ref 134–144)
Total Protein: 7 g/dL (ref 6.0–8.5)

## 2018-02-04 LAB — LIPASE: LIPASE: 104 U/L — AB (ref 13–78)

## 2018-02-04 LAB — HEMOGLOBIN A1C
Est. average glucose Bld gHb Est-mCnc: 117 mg/dL
Hgb A1c MFr Bld: 5.7 % — ABNORMAL HIGH (ref 4.8–5.6)

## 2018-02-08 ENCOUNTER — Encounter: Payer: Self-pay | Admitting: Family Medicine

## 2018-02-08 ENCOUNTER — Ambulatory Visit (INDEPENDENT_AMBULATORY_CARE_PROVIDER_SITE_OTHER): Payer: Medicare Other | Admitting: Family Medicine

## 2018-02-08 VITALS — BP 125/78 | HR 87 | Ht 69.0 in | Wt 231.0 lb

## 2018-02-08 DIAGNOSIS — E1169 Type 2 diabetes mellitus with other specified complication: Secondary | ICD-10-CM

## 2018-02-08 DIAGNOSIS — Z723 Lack of physical exercise: Secondary | ICD-10-CM

## 2018-02-08 DIAGNOSIS — E6609 Other obesity due to excess calories: Secondary | ICD-10-CM | POA: Diagnosis not present

## 2018-02-08 DIAGNOSIS — Z794 Long term (current) use of insulin: Secondary | ICD-10-CM

## 2018-02-08 DIAGNOSIS — I152 Hypertension secondary to endocrine disorders: Secondary | ICD-10-CM

## 2018-02-08 DIAGNOSIS — R748 Abnormal levels of other serum enzymes: Secondary | ICD-10-CM | POA: Insufficient documentation

## 2018-02-08 DIAGNOSIS — E1159 Type 2 diabetes mellitus with other circulatory complications: Secondary | ICD-10-CM

## 2018-02-08 DIAGNOSIS — I10 Essential (primary) hypertension: Secondary | ICD-10-CM

## 2018-02-08 DIAGNOSIS — E785 Hyperlipidemia, unspecified: Secondary | ICD-10-CM

## 2018-02-08 NOTE — Patient Instructions (Addendum)
Look at potassium rich foods on the internet - do a Producer, television/film/video for "potassium rich foods," and avoid them!  Most recent A1c went from 7.53 months ago to now 5.7.  This is fantastic!!    Please keep up with the prudent diet and just eating less calories and trying to move a little more.    We are stopping 2 of your diabetes medicines and you are just continuing with the once weekly injection of Trulicity.  Also please keep an eye on your blood pressure because as you lose weight, your blood pressure will go down and we can stop various medications especially if you combine walking to a goal of 30 minutes 5 days a week with the more prudent diet.     GOALS :  The goals are for the Hgb A1C to be less than 7.0 & blood pressure to be less than 130/80.    It is recommended that all diabetics are educated on and follow a healthy diabetic diet, exercise for 30 minutes 3-4 times per week (walking, biking, swimming, or machine), monitor blood glucose readings and bring that record with you to be reviewed at your next office visit.     You should be checking fasting blood sugars- especially after you eat poorly or eat really healthy, and also check 2 hour postprandial blood sugars after largest meal of the day.    Write these down and bring in your log at each office visit.    You will need to be seen every 3 months by the provider managing your Diabetes unless told otherwise by that provider.   You will need yearly eye exams from an eye specialist and foot exams to check the nerves of your feet.  Also, your urine should be checked yearly as well to make sure excess protein is not present.   If you are checking your blood pressure at home, please record it and bring it to your next office visit.    Follow the Dietary Approaches to Stop Hypertension (DASH) diet (3 servings of fruit and vegetables daily, whole grains, low sodium, low-fat proteins).  See below.    Lastly, when it comes to your  cholesterol, the goal is to have the HDL (good cholesterol) >40, and the LDL (bad cholesterol) <100.   It is recommended that you follow a heart healthy, low saturated and trans-fat diet and exercise for 30 minutes at least 5 times a week.     (( Check out the DASH diet = 1.5 Gram Low Sodium Diet   A 1.5 gram sodium diet restricts the amount of sodium in the diet to no more than 1.5 g or 1500 mg daily.  The American Heart Association recommends Americans over the age of 57 to consume no more than 1500 mg of sodium each day to reduce the risk of developing high blood pressure.  Research also shows that limiting sodium may reduce heart attack and stroke risk.  Many foods contain sodium for flavor and sometimes as a preservative.  When the amount of sodium in a diet needs to be low, it is important to know what to look for when choosing foods and drinks.  The following includes some information and guidelines to help make it easier for you to adapt to a low sodium diet.    QUICK TIPS  Do not add salt to food.  Avoid convenience items and fast food.  Choose unsalted snack foods.  Buy lower sodium products, often labeled  as "lower sodium" or "no salt added."  Check food labels to learn how much sodium is in 1 serving.  When eating at a restaurant, ask that your food be prepared with less salt or none, if possible.    READING FOOD LABELS FOR SODIUM INFORMATION  The nutrition facts label is a good place to find how much sodium is in foods. Look for products with no more than 400 mg of sodium per serving.  Remember that 1.5 g = 1500 mg.  The food label may also list foods as:  Sodium-free: Less than 5 mg in a serving.  Very low sodium: 35 mg or less in a serving.  Low-sodium: 140 mg or less in a serving.  Light in sodium: 50% less sodium in a serving. For example, if a food that usually has 300 mg of sodium is changed to become light in sodium, it will have 150 mg of sodium.  Reduced sodium: 25%  less sodium in a serving. For example, if a food that usually has 400 mg of sodium is changed to reduced sodium, it will have 300 mg of sodium.    CHOOSING FOODS  Grains  Avoid: Salted crackers and snack items. Some cereals, including instant hot cereals. Bread stuffing and biscuit mixes. Seasoned rice or pasta mixes.  Choose: Unsalted snack items. Low-sodium cereals, oats, puffed wheat and rice, shredded wheat. English muffins and bread. Pasta.  Meats  Avoid: Salted, canned, smoked, spiced, pickled meats, including fish and poultry. Bacon, ham, sausage, cold cuts, hot dogs, anchovies.  Choose: Low-sodium canned tuna and salmon. Fresh or frozen meat, poultry, and fish.  Dairy  Avoid: Processed cheese and spreads. Cottage cheese. Buttermilk and condensed milk. Regular cheese.  Choose: Milk. Low-sodium cottage cheese. Yogurt. Sour cream. Low-sodium cheese.  Fruits and Vegetables  Avoid: Regular canned vegetables. Regular canned tomato sauce and paste. Frozen vegetables in sauces. Olives. Angie Fava. Relishes. Sauerkraut.  Choose: Low-sodium canned vegetables. Low-sodium tomato sauce and paste. Frozen or fresh vegetables. Fresh and frozen fruit.  Condiments  Avoid: Canned and packaged gravies. Worcestershire sauce. Tartar sauce. Barbecue sauce. Soy sauce. Steak sauce. Ketchup. Onion, garlic, and table salt. Meat flavorings and tenderizers.  Choose: Fresh and dried herbs and spices. Low-sodium varieties of mustard and ketchup. Lemon juice. Tabasco sauce. Horseradish.    SAMPLE 1.5 GRAM SODIUM MEAL PLAN:   Breakfast / Sodium (mg)  1 cup low-fat milk / 143 mg  1 whole-wheat English muffin / 240 mg  1 tbs heart-healthy margarine / 153 mg  1 hard-boiled egg / 139 mg  1 small orange / 0 mg  Lunch / Sodium (mg)  1 cup raw carrots / 76 mg  2 tbs no salt added peanut butter / 5 mg  2 slices whole-wheat bread / 270 mg  1 tbs jelly / 6 mg   cup red grapes / 2 mg  Dinner / Sodium (mg)  1 cup  whole-wheat pasta / 2 mg  1 cup low-sodium tomato sauce / 73 mg  3 oz lean ground beef / 57 mg  1 small side salad (1 cup raw spinach leaves,  cup cucumber,  cup yellow bell pepper) with 1 tsp olive oil and 1 tsp red wine vinegar / 25 mg  Snack / Sodium (mg)  1 container low-fat vanilla yogurt / 107 mg  3 graham cracker squares / 127 mg  Nutrient Analysis  Calories: 1745  Protein: 75 g  Carbohydrate: 237 g  Fat: 57 g  Sodium:  1425 mg  Document Released: 08/03/2005 Document Revised: 04/15/2011 Document Reviewed: 11/04/2009  [facility] Patient Information 2012 [place], [place].))

## 2018-02-08 NOTE — Progress Notes (Signed)
Assessment and plan:  1. Type 2 diabetes mellitus with other specified complication, with long-term current use of insulin ([place])   2. Hyperlipidemia associated with type 2 diabetes mellitus ([place])   3. Hypertension associated with diabetes ([place])   4. Class 1 obesity due to excess calories with serious comorbidity in adult, unspecified BMI   5. Physically inactive   6. Elevated lipase     1. Recent labs from 02/03/2018 reviewed today.  - Potassium = elevated. - Advised patient to be careful and avoid potassium rich foods.  - Liver enzymes = WNL.  - Kidney function = WNL - Encouraged patient to drink half of his body weight in ounces of water.  This would be around 115 oz of water daily.  - Lipase = elevated, at 104.   - Questionable acute pancreatitis risk.   - Pt asymptomatic; concurrent with treatment on Kombiglyze. - Patient has been on Atlantis since 02/2016 via a former provider. - Will discontinue Kombiglyze today. - Asked CMA to call lab to have amylase test added today. - Re-check pancreatic enzymes in 3 months.  2. Diabetes Mellitus Management - Discontinue Kombiglyze and glipizide today. - Patient will continue treatment with Trulicity.  - Patient started on 11/08/2017 dulaglutide (Trulicity). - Reviewed that this medication can assist with weight loss. - Patient tolerates medication well and has lost nearly 30 lbs since starting.  A1c was 7.5 when we started Trulicity.  A1c is 5.7 today.  - Goal is to keep A1c under 6.5.  - Continue to monitor fasting blood sugar at home and keep a log for evaluation.  - Patient knows to monitor for blood sugar lows.  - Especially pay attention to dizziness or lightheadedness, especially if he experiences this while standing from a seated position, as this could indicate a low blood pressure.  As patient continues to lose weight, he knows we may need to  discontinue some of his blood pressure medications in the future.  3. Lifestyle & Preventative Health Maintenance - Advised patient to continue working toward exercising to improve health.    - Recommended that the patient eventually strive for at least 150 minutes of moderate cardiovascular activity per week according to guidelines established by the [facility].   - Healthy dietary habits encouraged, including low-carb, and high amounts of lean protein in diet.   - Patient should also consume adequate amounts of water - half of body weight in oz of water per day.  4. Follow-Up - Return for regularly scheduled chronic follow-up.  - Encouraged patient to investigate his insurance to find ways around the "donut hole."  - Patient does not mind if his name is given to a Trulicity drug rep for follow-up.   Education and routine counseling performed. Handouts provided.  No orders of the defined types were placed in this encounter.   No orders of the defined types were placed in this encounter.    Return in about 3 months (around 05/11/2018) for Follow-up 3 months, A1c, lipase and amylase will need to be drawn at that time.   Anticipatory guidance and routine counseling done re: condition, txmnt options and need for follow up. All questions of patient's were answered.   Gross side effects, risk and benefits, and alternatives of medications discussed with patient.  Patient is aware that all medications have potential side effects and we are unable to predict every sideeffect or drug-drug interaction that may occur.  Expresses verbal understanding and  consents to current therapy plan and treatment regiment.  Please see AVS handed out to patient at the end of our visit for additional patient instructions/ counseling done pertaining to today's office visit.  Note: This document was prepared using Dragon voice recognition software and may include unintentional dictation errors.    This document  serves as a record of services personally performed by [name], DO. It was created on her behalf by Toni Amend, a trained medical scribe. The creation of this record is based on the scribe's personal observations and the provider's statements to them.   I have reviewed the above medical documentation for accuracy and completeness and I concur.  [name] 02/13/18 10:52 PM   ------------------------------------------------------------------------------------------------------------  Subjective:   CC:   Joseph Collier is a 67 y.o. male who presents to [place] at [facility] today for review and discussion of recent bloodwork that was done.  1. All recent blood work that we ordered was reviewed with patient today.  Patient was counseled on all abnormalities and we discussed dietary and lifestyle changes that could help those values (also medications when appropriate).  Extensive health counseling performed and all patient's concerns/ questions were addressed.   Elevated Lipase (Concurrent with Treatment on Kombiglyze) Patient denies nausea, vomiting, denies pain after eating certain foods.  Patient otherwise asymptomatic; reports that he feels great.  Patient's goal is to go off of medications.  DM HPI:  -  He has been working hard on diet and exercise for diabetes.  At home, patient has lost nearly 30 lbs since starting Trulicity in March.  He is very excited about his weight loss.  States that he feels better than he has in a long time.  Notes that his wife is a little jealous of his weight loss, but super proud of him.   Patient has been consuming very little dairy, mostly eating chicken, [place], and fish.  He hasn't been eating much red meat anymore.  States "I don't care for vegetables," feels questionable about salads, but has been eating a lot of fruits.  Pt is currently maintained on the following medications for diabetes:   see med list  today  Medication compliance - has been taking all medications as prescribed.   Denies polyuria/polydipsia.  Denies hypo/ hyperglycemia symptoms  Last diabetic eye exam was  Lab Results  Component Value Date   HMDIABEYEEXA No Retinopathy 05/05/2016   Foot exam- UTD  Last A1C in the office was:  Lab Results  Component Value Date   HGBA1C 5.7 (H) 02/03/2018   HGBA1C 7.5 11/08/2017   HGBA1C 7.3 08/03/2017    Lab Results  Component Value Date   MICROALBUR 30 11/08/2017   LDLCALC 47 04/22/2017   CREATININE 0.92 02/03/2018    Wt Readings from Last 3 Encounters:  02/08/18 231 lb (104.8 kg)  12/13/17 250 lb 12.8 oz (113.8 kg)  11/08/17 260 lb (117.9 kg)    BP Readings from Last 3 Encounters:  02/08/18 125/78  12/13/17 124/77  11/08/17 137/77     Wt Readings from Last 3 Encounters:  02/08/18 231 lb (104.8 kg)  12/13/17 250 lb 12.8 oz (113.8 kg)  11/08/17 260 lb (117.9 kg)   BP Readings from Last 3 Encounters:  02/08/18 125/78  12/13/17 124/77  11/08/17 137/77   Pulse Readings from Last 3 Encounters:  02/08/18 87  12/13/17 71  11/08/17 84   BMI Readings from Last 3 Encounters:  02/08/18 34.11 kg/m  12/13/17 37.04 kg/m  11/08/17 38.40 kg/m     Patient Care Team    Relationship Specialty Notifications Start End  [name], DO PCP - General Family Medicine  02/27/16     Full medical history updated and reviewed in the office today  Patient Active Problem List   Diagnosis Date Noted  . Family history of cardiovascular disease 05/24/2015    Priority: High  . Diabetes mellitus, type II ([place]) 05/24/2015    Priority: High  . Hyperlipidemia associated with type 2 diabetes mellitus ([place]) 09/05/2012    Priority: High  . Hypertension associated with diabetes ([place]) 09/05/2012    Priority: High  . Class 1 obesity due to excess calories with serious comorbidity in adult 03/07/2016    Priority: Medium  . Physically inactive 12/23/2016    Priority:  Low  . History of tobacco abuse- less than 30 pack year history quit 1991 03/07/2016    Priority: Low  . Elevated lipase 02/08/2018  . Personal history of colonic polyps 03/07/2016  . Allergic rhinitis 03/07/2016  . History of colonic polyps 05/24/2015  . Erectile dysfunction 05/10/2014  . History of melanoma 02/05/2014  . GERD (gastroesophageal reflux disease) 02/05/2014  . Degenerative disc disease, cervical 10/30/2013  . History of surgical procedure 10/07/2011    Past Medical History:  Diagnosis Date  . Allergic rhinitis, cause unspecified   . Anemia, unspecified   . Arthritis   . Cancer ([place]) 08/17/2006   Melanoma  . Essential hypertension, benign   . Impotence of organic origin 04/14/2007  . Obesity, unspecified   . Other and unspecified hyperlipidemia   . Personal history of colonic polyps   . Skin lesion 04/14/2007   melanoma    Past Surgical History:  Procedure Laterality Date  . APPENDECTOMY    . arthroscopy of knee  2008   Left Knee  . COLONOSCOPY  08/18/2007   normal. [name].  Marland Kitchen HERNIA REPAIR     age 64  . JOINT REPLACEMENT    . melanoma of forearm resection  1992   Left        [name]  . TONSILLECTOMY AND ADENOIDECTOMY      Social History   Tobacco Use  . Smoking status: Former Smoker    Packs/day: 1.00    Years: 25.00    Pack years: 25.00    Types: Cigarettes    Last attempt to quit: 08/17/1989    Years since quitting: 28.5  . Smokeless tobacco: Never Used  . Tobacco comment: quit 1991  Substance Use Topics  . Alcohol use: Yes    Alcohol/week: 3.0 oz    Types: 5 Cans of beer per week    Comment: moderate: drinks hard liquor 7 drinks per month in 08/2011/4 drinks 4 a week    Family Hx: Family History  Problem Relation Age of Onset  . Coronary artery disease Father   . Hypertension Father   . Heart disease Father        AMI cause of death  . Diabetes Father   . Cancer Mother 70       lung  . Hypertension Brother   . Diabetes Brother       Medications: Current Outpatient Medications  Medication Sig Dispense Refill  . aspirin EC 81 MG tablet Take 81 mg by mouth daily.    . cyclobenzaprine (FLEXERIL) 10 MG tablet Take 1 tablet (10 mg total) by mouth 3 (three) times daily as needed for muscle spasms. 30 tablet  0  . Dulaglutide 1.5 MG/0.5ML SOPN Inject 1.5 mg into the skin once a week. 12 pen 3  . fluticasone (FLONASE) 50 MCG/ACT nasal spray Place 2 sprays into both nostrils daily.    Marland Kitchen glucose blood test strip Use as instructed 100 each 12  . lisinopril (PRINIVIL,ZESTRIL) 40 MG tablet TAKE 1 TABLET BY MOUTH EVERY DAY 90 tablet 1  . omeprazole (PRILOSEC) 20 MG capsule TAKE 1 CAPSULE BY MOUTH EVERY DAY 90 capsule 1  . simvastatin (ZOCOR) 20 MG tablet TAKE 1 TABLET BY MOUTH DAILY AT 6 PM. 90 tablet 1  . tadalafil (CIALIS) 20 MG tablet Take 1 tablet (20 mg total) by mouth daily as needed. 10 tablet 11   No current facility-administered medications for this visit.     Allergies:  Allergies  Allergen Reactions  . Penicillins Hives and Swelling  . Tetracyclines & Related Hives and Swelling     Review of Systems: General:   No F/C, wt loss Pulm:   No DIB, SOB, pleuritic chest pain Card:  No CP, palpitations Abd:  No n/v/d or pain Ext:  No inc edema from baseline  Objective:  Blood pressure 125/78, pulse 87, height 5\' 9"  (1.753 m), weight 231 lb (104.8 kg), SpO2 98 %. Body mass index is 34.11 kg/m. Gen:   Well NAD, A and O *3 HEENT:    [place]/AT, EOMI,  MMM Lungs:   Normal work of breathing. CTA B/L, no Wh, rhonchi Heart:   RRR, S1, S2 WNL's, no MRG Abd:   No gross distention Exts:    warm, pink,  Brisk capillary refill, warm and well perfused.  Psych:    No HI/SI, judgement and insight good, Euthymic mood. Full Affect.   Recent Results (from the past 2160 hour(s))  Lipase     Status: Abnormal   Collection Time: 02/03/18  8:29 AM  Result Value Ref Range   Lipase 104 (H) 13 - 78 U/L  Hemoglobin A1c     Status:  Abnormal   Collection Time: 02/03/18  8:29 AM  Result Value Ref Range   Hgb A1c MFr Bld 5.7 (H) 4.8 - 5.6 %    Comment:          Prediabetes: 5.7 - 6.4          Diabetes: >6.4          Glycemic control for adults with diabetes: <7.0    Est. average glucose Bld gHb Est-mCnc 117 mg/dL  Comprehensive metabolic panel     Status: Abnormal   Collection Time: 02/03/18  8:29 AM  Result Value Ref Range   Glucose 87 65 - 99 mg/dL   BUN 16 8 - 27 mg/dL   Creatinine, Ser 0.92 0.76 - 1.27 mg/dL   GFR calc non Af Amer 87 >59 mL/min/1.73   GFR calc Af Amer 101 >59 mL/min/1.73   BUN/Creatinine Ratio 17 10 - 24   Sodium 136 134 - 144 mmol/L   Potassium 5.6 (H) 3.5 - 5.2 mmol/L   Chloride 99 96 - 106 mmol/L   CO2 24 20 - 29 mmol/L   Calcium 10.0 8.6 - 10.2 mg/dL   Total Protein 7.0 6.0 - 8.5 g/dL   Albumin 4.8 3.6 - 4.8 g/dL   Globulin, Total 2.2 1.5 - 4.5 g/dL   Albumin/Globulin Ratio 2.2 1.2 - 2.2   Bilirubin Total 0.5 0.0 - 1.2 mg/dL   Alkaline Phosphatase 45 39 - 117 IU/L   AST 18 0 - 40  IU/L   ALT 28 0 - 44 IU/L

## 2018-05-09 ENCOUNTER — Encounter: Payer: Self-pay | Admitting: Family Medicine

## 2018-05-09 ENCOUNTER — Ambulatory Visit (INDEPENDENT_AMBULATORY_CARE_PROVIDER_SITE_OTHER): Payer: Medicare Other | Admitting: Family Medicine

## 2018-05-09 VITALS — BP 136/88 | HR 85 | Ht 69.0 in | Wt 225.8 lb

## 2018-05-09 DIAGNOSIS — E6609 Other obesity due to excess calories: Secondary | ICD-10-CM

## 2018-05-09 DIAGNOSIS — E1169 Type 2 diabetes mellitus with other specified complication: Secondary | ICD-10-CM

## 2018-05-09 DIAGNOSIS — E1159 Type 2 diabetes mellitus with other circulatory complications: Secondary | ICD-10-CM | POA: Diagnosis not present

## 2018-05-09 DIAGNOSIS — N529 Male erectile dysfunction, unspecified: Secondary | ICD-10-CM | POA: Diagnosis not present

## 2018-05-09 DIAGNOSIS — Z794 Long term (current) use of insulin: Secondary | ICD-10-CM

## 2018-05-09 DIAGNOSIS — Z723 Lack of physical exercise: Secondary | ICD-10-CM | POA: Diagnosis not present

## 2018-05-09 DIAGNOSIS — E785 Hyperlipidemia, unspecified: Secondary | ICD-10-CM | POA: Diagnosis not present

## 2018-05-09 DIAGNOSIS — I10 Essential (primary) hypertension: Secondary | ICD-10-CM | POA: Diagnosis not present

## 2018-05-09 DIAGNOSIS — R748 Abnormal levels of other serum enzymes: Secondary | ICD-10-CM

## 2018-05-09 NOTE — Progress Notes (Signed)
Assessment and Plan:  1. Type 2 diabetes mellitus with other specified complication, with long-term current use of insulin ([place])   2. Hypertension associated with diabetes ([place])   3. Hyperlipidemia associated with type 2 diabetes mellitus ([facility])   4. Class 1 obesity due to excess calories with serious comorbidity in adult, unspecified BMI   5. Erectile dysfunction, unspecified erectile dysfunction type   6. Elevated lipase     Hypertension:  -Rechecked bp in office;  136/88 -Discussed the importance of ambulatory bp  -Encouraged pt to increase exercise levels to help improve bp -Discussed the importance of lowering the diastolic bp -Discussed healthy bp levels and importance of keeping bp  In h  HLD -Ordered lipid panel in OV -Discussed positive impact of weight on HLD -Discussed the impact of healthy diet on HLD -Encouraged pt to continue making good choices to help control HLD  Elevated Lipase -Ordered blood testing today in OV -Discussed impact of glipizide and kombazide on lipase levels -Discussed negative impact of elevated lipase levels on pancreas  Diabetes:    -Ordered A1C  -Discussed the importance of regularly checking blood sugar -Encouraged pt to continue making heathy dietary choices -Discussed the importance of maintaining positive changes to keep A1C low -Discussed the possibility of further reducing medications if A1C is low   Obesity/ Inactivity -Pt has lost 35 lbs total  -Encouraged pt to increase his activity levels  -Discussed the importance of weight loss on overall health -Discussed making sustainable changes such as walking each day and moving more at work  Erectile dysfunction -Sx of dysfunction improving -Discussed the importance of exercise and weight loss in improving sexual function -Discussed the impact of sexual function on confidence for men -Encouraged pt to continue increasing exercise to help maintain improved sexual  function   Follow up -Pt to return in 3-4 months for chronic disease management; will contact if lab results are abnormal   Extensive education and routine counseling performed. Handouts provided if pt desires.   Orders Placed This Encounter  Procedures  . Comprehensive metabolic panel  . Hemoglobin A1c  . Lipase  . Amylase  . Lipid panel   -Reminded patient the need for yearly complete physical exam office visits in addition to office visits for management of the chronic diseases  -Gross side effects, risk and benefits, and alternatives of medications discussed with patient.  Patient is aware that all medications have potential side effects and we are unable to predict every side effect or drug-drug interaction that may occur.  Expresses verbal understanding and consents to current therapy plan and treatment regimen.    Follow Up:  Return for DM, HTN, HLD follow up every 54mo.   Please see AVS handed out to patient at the end of our visit for further patient instructions/ counseling done pertaining to today's office visit.    Note:  This document was prepared using Dragon voice recognition software and may include unintentional dictation errors.   This document serves as a record of services personally performed by [name], MD. It was created on her behalf by Georga Bora, a trained medical scribe. The creation of this record is based on the scribe's personal observations and the provider's statements to them.   I have reviewed the above medical documentation for accuracy and completeness and I concur.  [name] 05/09/18 11:54 AM   --------------------------------------------------------------------------------------------------------------------------------    Subjective:  HPI: Joseph Collier 67 y.o. male  presents for  3 month follow up for multiple medical problems.   HPI:  Joseph Collier y.o. male presents for 3 month follow up for multiple  medical problems.   Diabetes  -A1C at 5.7 on 01/2018; down from 7.5 in 10/2017 -Last OV lipase levated to 104; stopped kombazide due to pancreatitis risk -Also last OV we Discontinued glipizide  -Pt has not tested his blood sugar in the last two months -States he hasn't been experiencing any negative SE -States he has been limiting portion size and avoiding overeating despite not checking sugar levels  Denies S-E   Denies polyuria/polydipsia. Denies hypoglycemia symptoms  Last diabetic eye exam was  Lab Results  Component Value Date   HMDIABEYEEXA No Retinopathy 05/05/2016    Foot exam-  UTD  Lab Results  Component Value Date   HGBA1C 5.7 (H) 02/03/2018   HGBA1C 7.5 11/08/2017   HGBA1C 7.3 08/03/2017    Lab Results  Component Value Date   MICROALBUR 30 11/08/2017   LDLCALC 47 04/22/2017   CREATININE 0.92 02/03/2018     Hyperlipidemia Pt reports compliance with medications and/ or treatment plan Denies S-E  RUQ pain-  denies  Muscle aches- denies Patient reports compliance with low fat/low cholesterol diet and healthier lifestyle modifications.   Last lipid panel as follows:  Lab Results  Component Value Date   CHOL 100 04/22/2017   HDL 39 (L) 04/22/2017   LDLCALC 47 04/22/2017   TRIG 71 04/22/2017   CHOLHDL 2.6 04/22/2017    Hypertension -136/88 today Pt reports compliance with medications Denies S-E. -Pt has not been checking his bp at home at all -States he hasn't been having headaches like he did before when his bp was high  Smoking Status noted  Patient denies new onset of sx- no chest pain, dizziness, HA, DIB/ shortness of breath  or swelling. Lab Results  Component Value Date   CREATININE 0.92 02/03/2018    Last 3 blood pressure readings in our office are as follows: BP Readings from Last 3 Encounters:  05/09/18 136/88  02/08/18 125/78  12/13/17 124/77    Sexual Function -pt states his erectile dysfunction has improved since previous  visits -States he no longer needs to use cialis at all  Weight: -Pt has lost 35 lbs since 10/2017 -Has been working on decreasing his portion sizes and making better choices -States he has hung up hi size 44 pants as motivation   Wt Readings from Last 3 Encounters:  05/09/18 225 lb 12.8 oz (102.4 kg)  02/08/18 231 lb (104.8 kg)  12/13/17 250 lb 12.8 oz (113.8 kg)   BMI Readings from Last 3 Encounters:  05/09/18 33.34 kg/m  02/08/18 34.11 kg/m  12/13/17 37.04 kg/m     Patient Care Team    Relationship Specialty Notifications Start End  [name], DO PCP - General Family Medicine  02/27/16      Current Medications:  Current Outpatient Medications on File Prior to Visit  Medication Sig Dispense Refill  . aspirin EC 81 MG tablet Take 81 mg by mouth daily.    . cyclobenzaprine (FLEXERIL) 10 MG tablet Take 1 tablet (10 mg total) by mouth 3 (three) times daily as needed for muscle spasms. 30 tablet 0  . Dulaglutide 1.5 MG/0.5ML SOPN Inject 1.5 mg into the skin once a week. 12 pen 3  . fluticasone (FLONASE) 50 MCG/ACT nasal spray Place 2 sprays into both nostrils daily.    Marland Kitchen glucose blood test strip Use as instructed 100  each 12  . lisinopril (PRINIVIL,ZESTRIL) 40 MG tablet TAKE 1 TABLET BY MOUTH EVERY DAY 90 tablet 1  . omeprazole (PRILOSEC) 20 MG capsule TAKE 1 CAPSULE BY MOUTH EVERY DAY 90 capsule 1  . simvastatin (ZOCOR) 20 MG tablet TAKE 1 TABLET BY MOUTH DAILY AT 6 PM. 90 tablet 1  . tadalafil (CIALIS) 20 MG tablet Take 1 tablet (20 mg total) by mouth daily as needed. 10 tablet 11   No current facility-administered medications on file prior to visit.     Medical History:  Patient Active Problem List   Diagnosis Date Noted  . Family history of cardiovascular disease 05/24/2015    Priority: High  . Diabetes mellitus, type II ([place]) 05/24/2015    Priority: High  . Hyperlipidemia associated with type 2 diabetes mellitus ([place]) 09/05/2012    Priority: High  .  Hypertension associated with diabetes ([place]) 09/05/2012    Priority: High  . Class 1 obesity due to excess calories with serious comorbidity in adult 03/07/2016    Priority: Medium  . Physically inactive 12/23/2016    Priority: Low  . History of tobacco abuse- less than 30 pack year history quit 1991 03/07/2016    Priority: Low  . Elevated lipase 02/08/2018  . Personal history of colonic polyps 03/07/2016  . Allergic rhinitis 03/07/2016  . History of colonic polyps 05/24/2015  . Erectile dysfunction 05/10/2014  . History of melanoma 02/05/2014  . GERD (gastroesophageal reflux disease) 02/05/2014  . Degenerative disc disease, cervical 10/30/2013  . History of surgical procedure 10/07/2011    Allergies:  Allergies  Allergen Reactions  . Penicillins Hives and Swelling  . Tetracyclines & Related Hives and Swelling     Family history-  Reviewed; changed as appropriate  Social history-  Reviewed; changed as appropriate    Review of Systems: General:   Denies fever, chills, unexplained weight loss.  Optho/Auditory:   Denies visual changes, blurred vision/LOV Respiratory:   Denies SOB, DOE more than baseline levels.   Cardiovascular:   Denies chest pain, palpitations, new onset peripheral edema  Gastrointestinal:   Denies nausea, vomiting, diarrhea.  Genitourinary: Denies dysuria, freq/ urgency, flank pain or discharge from genitals.  Endocrine:     Denies hot or cold intolerance, polyuria, polydipsia. Musculoskeletal:   Denies unexplained myalgias, joint swelling, unexplained arthralgias, gait problems.  Skin:  Denies rash, suspicious lesions Neurological:     Denies dizziness, unexplained weakness, numbness  Psychiatric/Behavioral:   Denies mood changes, suicidal or homicidal ideations, hallucinations    Objective: Physical Exam: BP 136/88   Pulse 85   Ht 5\' 9"  (1.753 m)   Wt 225 lb 12.8 oz (102.4 kg)   SpO2 97%   BMI 33.34 kg/m  Body mass index is 33.34  kg/m. General: Well nourished, in no apparent distress. Eyes: PERRLA, EOMs, conjunctiva clr no swelling or erythema ENT/Mouth: Hearing appears normal.  Mucus Membranes Moist  Neck: Supple, no masses, no carotid bruits b/l Resp: Respiratory effort- normal, ECTA B/L w/o W/R/R  Cardio: RRR,  + S1, S2, no M Abdomen: no gross distention. Lymphatics:  Brisk peripheral pulses, less 2 sec cap RF, no gross edema  M-sk: Full ROM, 5/5 strength, normal gait.  Skin: Warm, dry without rashes, lesions, ecchymosis.  Neuro: Alert, Oriented Psych: Normal affect, Insight and Judgment appropriate.

## 2018-05-09 NOTE — Patient Instructions (Addendum)
Great job [place] on the weight loss and improvement of all of your medical conditions!   Please continue to eat a prudent diet and try to get exercise beyond your work activities.  This will continue to have you focus on your health and well-being, keep medications at [facility] and hopefully in the future we will just manage your diseases with diet and lifestyle!!    :)   I will let you know via my chart what your labs were from today and if needed, will make changes but hopefully we will not    GOALS :  The goals are for the Hgb A1C to be less than 7.0 & blood pressure to be less than 135/85.    It is recommended that all diabetics are educated on and follow a healthy diabetic diet, exercise for 30 minutes 3-4 times per week (walking, biking, swimming, or machine), monitor blood glucose readings and bring that record with you to be reviewed at your next office visit.     You should be checking fasting blood sugars- especially after you eat poorly or eat really healthy, and also check 2 hour postprandial blood sugars after largest meal of the day.    Write these down and bring in your log at each office visit.    You will need to be seen every 3 months by the provider managing your Diabetes unless told otherwise by that provider.   You will need yearly eye exams from an eye specialist and foot exams to check the nerves of your feet.  Also, your urine should be checked yearly as well to make sure excess protein is not present.   If you are checking your blood pressure at home, please record it and bring it to your next office visit.    Follow the Dietary Approaches to Stop Hypertension (DASH) diet (3 servings of fruit and vegetables daily, whole grains, low sodium, low-fat proteins).  See below.    Lastly, when it comes to your cholesterol, the goal is to have the HDL (good cholesterol) >40, and the LDL (bad cholesterol) <100.   It is recommended that you follow a heart healthy, low saturated and  trans-fat diet and exercise for 30 minutes at least 5 times a week.     (( Check out the DASH diet = 1.5 Gram Low Sodium Diet   A 1.5 gram sodium diet restricts the amount of sodium in the diet to no more than 1.5 g or 1500 mg daily.  The American Heart Association recommends Americans over the age of 58 to consume no more than 1500 mg of sodium each day to reduce the risk of developing high blood pressure.  Research also shows that limiting sodium may reduce heart attack and stroke risk.  Many foods contain sodium for flavor and sometimes as a preservative.  When the amount of sodium in a diet needs to be low, it is important to know what to look for when choosing foods and drinks.  The following includes some information and guidelines to help make it easier for you to adapt to a low sodium diet.    QUICK TIPS  Do not add salt to food.  Avoid convenience items and fast food.  Choose unsalted snack foods.  Buy lower sodium products, often labeled as "lower sodium" or "no salt added."  Check food labels to learn how much sodium is in 1 serving.  When eating at a restaurant, ask that your food be prepared with  less salt or none, if possible.    READING FOOD LABELS FOR SODIUM INFORMATION  The nutrition facts label is a good place to find how much sodium is in foods. Look for products with no more than 400 mg of sodium per serving.  Remember that 1.5 g = 1500 mg.  The food label may also list foods as:  Sodium-free: Less than 5 mg in a serving.  Very low sodium: 35 mg or less in a serving.  Low-sodium: 140 mg or less in a serving.  Light in sodium: 50% less sodium in a serving. For example, if a food that usually has 300 mg of sodium is changed to become light in sodium, it will have 150 mg of sodium.  Reduced sodium: 25% less sodium in a serving. For example, if a food that usually has 400 mg of sodium is changed to reduced sodium, it will have 300 mg of sodium.    CHOOSING FOODS  Grains   Avoid: Salted crackers and snack items. Some cereals, including instant hot cereals. Bread stuffing and biscuit mixes. Seasoned rice or pasta mixes.  Choose: Unsalted snack items. Low-sodium cereals, oats, puffed wheat and rice, shredded wheat. English muffins and bread. Pasta.  Meats  Avoid: Salted, canned, smoked, spiced, pickled meats, including fish and poultry. Bacon, ham, sausage, cold cuts, hot dogs, anchovies.  Choose: Low-sodium canned tuna and salmon. Fresh or frozen meat, poultry, and fish.  Dairy  Avoid: Processed cheese and spreads. Cottage cheese. Buttermilk and condensed milk. Regular cheese.  Choose: Milk. Low-sodium cottage cheese. Yogurt. Sour cream. Low-sodium cheese.  Fruits and Vegetables  Avoid: Regular canned vegetables. Regular canned tomato sauce and paste. Frozen vegetables in sauces. Olives. Angie Fava. Relishes. Sauerkraut.  Choose: Low-sodium canned vegetables. Low-sodium tomato sauce and paste. Frozen or fresh vegetables. Fresh and frozen fruit.  Condiments  Avoid: Canned and packaged gravies. Worcestershire sauce. Tartar sauce. Barbecue sauce. Soy sauce. Steak sauce. Ketchup. Onion, garlic, and table salt. Meat flavorings and tenderizers.  Choose: Fresh and dried herbs and spices. Low-sodium varieties of mustard and ketchup. Lemon juice. Tabasco sauce. Horseradish.    SAMPLE 1.5 GRAM SODIUM MEAL PLAN:   Breakfast / Sodium (mg)  1 cup low-fat milk / 143 mg  1 whole-wheat English muffin / 240 mg  1 tbs heart-healthy margarine / 153 mg  1 hard-boiled egg / 139 mg  1 small orange / 0 mg  Lunch / Sodium (mg)  1 cup raw carrots / 76 mg  2 tbs no salt added peanut butter / 5 mg  2 slices whole-wheat bread / 270 mg  1 tbs jelly / 6 mg   cup red grapes / 2 mg  Dinner / Sodium (mg)  1 cup whole-wheat pasta / 2 mg  1 cup low-sodium tomato sauce / 73 mg  3 oz lean ground beef / 57 mg  1 small side salad (1 cup raw spinach leaves,  cup cucumber,  cup yellow bell  pepper) with 1 tsp olive oil and 1 tsp red wine vinegar / 25 mg  Snack / Sodium (mg)  1 container low-fat vanilla yogurt / 107 mg  3 graham cracker squares / 127 mg  Nutrient Analysis  Calories: 1745  Protein: 75 g  Carbohydrate: 237 g  Fat: 57 g  Sodium: 1425 mg  Document Released: 08/03/2005 Document Revised: 04/15/2011 Document Reviewed: 11/04/2009  [facility] Patient Information 2012 [place], [place].))

## 2018-05-10 LAB — COMPREHENSIVE METABOLIC PANEL
ALBUMIN: 4.7 g/dL (ref 3.6–4.8)
ALT: 14 IU/L (ref 0–44)
AST: 18 IU/L (ref 0–40)
Albumin/Globulin Ratio: 2.1 (ref 1.2–2.2)
Alkaline Phosphatase: 60 IU/L (ref 39–117)
BUN/Creatinine Ratio: 12 (ref 10–24)
BUN: 11 mg/dL (ref 8–27)
Bilirubin Total: 0.5 mg/dL (ref 0.0–1.2)
CALCIUM: 9.8 mg/dL (ref 8.6–10.2)
CO2: 25 mmol/L (ref 20–29)
Chloride: 105 mmol/L (ref 96–106)
Creatinine, Ser: 0.94 mg/dL (ref 0.76–1.27)
GFR calc Af Amer: 98 mL/min/{1.73_m2} (ref >59)
GFR calc non Af Amer: 85 mL/min/{1.73_m2} (ref >59)
GLOBULIN, TOTAL: 2.2 g/dL (ref 1.5–4.5)
GLUCOSE: 98 mg/dL (ref 65–99)
Potassium: 4.7 mmol/L (ref 3.5–5.2)
Sodium: 145 mmol/L — ABNORMAL HIGH (ref 134–144)
Total Protein: 6.9 g/dL (ref 6.0–8.5)

## 2018-05-10 LAB — LIPID PANEL
CHOL/HDL RATIO: 2.4 ratio (ref 0.0–5.0)
Cholesterol, Total: 95 mg/dL — ABNORMAL LOW (ref 100–199)
HDL: 39 mg/dL — ABNORMAL LOW (ref >39)
LDL Calculated: 45 mg/dL (ref 0–99)
TRIGLYCERIDES: 57 mg/dL (ref 0–149)
VLDL Cholesterol Cal: 11 mg/dL (ref 5–40)

## 2018-05-10 LAB — HEMOGLOBIN A1C
ESTIMATED AVERAGE GLUCOSE: 151 mg/dL
HEMOGLOBIN A1C: 6.9 % — AB (ref 4.8–5.6)

## 2018-05-10 LAB — AMYLASE: AMYLASE: 70 U/L (ref 31–124)

## 2018-05-10 LAB — LIPASE: LIPASE: 69 U/L (ref 13–78)

## 2018-08-07 NOTE — Patient Instructions (Addendum)
Pleas call Dr. Alois Cliche for a diabetic eye exam in the near future.    If you need medication refills, please be sure to call your pharmacy to request them.   This is the fastest and easiest way for you to obtain refills.   However, if a refill is not appropriate, one of our staff members or the pharmacy should contact you to let you know why it was denied.  Also, once you receive your after visit summary at the end of your appointment, please CAREFULLY and THOROUGHLY read your current medication list.    If you find inaccurate information, or if you take your medications differently than what is documented (even over-the-counter ones) please be sure to call the office and speak to one of our medical assistants about it so this can be corrected in your chart.    Lastly, please note, if you have an active MyChart account, even if you are not using it and checking it regularly, this is how we will be contacting you regarding lab results, test results etc. So please remember to check your account 1 to 2 weeks after blood work or tests that we ordered is completed.    If you do not want to be emailed results and wish to be called instead, you will need to cancel the MyChart account.   Tyler at our front desk can do that for you.  Thank you and it was our pleasure to take care of you today!     Diabetes Mellitus and Standards of Medical Care Managing diabetes (diabetes mellitus) can be complicated. Your diabetes treatment may be managed by a team of health care providers, including:  A diet and nutrition specialist (registered dietitian).  A nurse.  A certified diabetes educator (CDE).  A diabetes specialist (endocrinologist).  An eye doctor.  A primary care provider.  A dentist.  Your health care providers follow a schedule in order to help you get the best quality of care. The following schedule is a general guideline for your diabetes management plan. Your health care providers may  also give you more specific instructions.  HbA1c (hemoglobin A1c) test This test provides information about blood sugar (glucose) control over the previous 2-3 months. It is used to check whether your diabetes management plan needs to be adjusted.  If you are meeting your treatment goals, this test is done at least 2 times a year.  If you are not meeting treatment goals or if your treatment goals have changed, this test is done 4 times a year.  Blood pressure test  This test is done at every routine medical visit. For most people, the goal is less than 130/80. Ask your health care provider what your goal blood pressure should be.  Dental and eye exams  Visit your dentist two times a year.  If you have type 1 diabetes, get an eye exam 3-5 years after you are diagnosed, and then once a year after your first exam. ? If you were diagnosed with type 1 diabetes as a child, get an eye exam when you are age 49 or older and have had diabetes for 3-5 years. After the first exam, you should get an eye exam once a year.  If you have type 2 diabetes, have an eye exam as soon as you are diagnosed, and then once a year after your first exam.  Foot care exam  Visual foot exams are done at every routine medical visit. The exams check  for cuts, bruises, redness, blisters, sores, or other problems with the feet.  A complete foot exam is done by your health care provider once a year. This exam includes an inspection of the structure and skin of your feet, and a check of the pulses and sensation in your feet. ? Type 1 diabetes: Get your first exam 3-5 years after diagnosis. ? Type 2 diabetes: Get your first exam as soon as you are diagnosed.  Check your feet every day for cuts, bruises, redness, blisters, or sores. If you have any of these or other problems that are not healing, contact your health care provider.  Kidney function test (urine microalbumin)  This test is done once a year. ? Type 1  diabetes: Get your first test 5 years after diagnosis. ? Type 2 diabetes: Get your first test as soon as you are diagnosed._  If you have chronic kidney disease (CKD), get a serum creatinine and estimated glomerular filtration rate (eGFR) test once a year.  Lipid profile (cholesterol, HDL, LDL, triglycerides)  This test should be done when you are diagnosed with diabetes, and every 5 years after the first test. If you are on medicines to lower your cholesterol, you may need to get this test done every year. ? The goal for LDL is less than 100 mg/dL (5.5 mmol/L). If you are at high risk, the goal is less than 70 mg/dL (3.9 mmol/L). ? The goal for HDL is 40 mg/dL (2.2 mmol/L) for men and 50 mg/dL(2.8 mmol/L) for women. An HDL cholesterol of 60 mg/dL (3.3 mmol/L) or higher gives some protection against heart disease. ? The goal for triglycerides is less than 150 mg/dL (8.3 mmol/L).  Immunizations  The yearly flu (influenza) vaccine is recommended for everyone 6 months or older who has diabetes.  The pneumonia (pneumococcal) vaccine is recommended for everyone 2 years or older who has diabetes. If you are 16 or older, you may get the pneumonia vaccine as a series of two separate shots.  The hepatitis B vaccine is recommended for adults shortly after they have been diagnosed with diabetes.  The Tdap (tetanus, diphtheria, and pertussis) vaccine should be given: ? According to normal childhood vaccination schedules, for children. ? Every 10 years, for adults who have diabetes.  The shingles vaccine is recommended for people who have had chicken pox and are 50 years or older.  Mental and emotional health  Screening for symptoms of eating disorders, anxiety, and depression is recommended at the time of diagnosis and afterward as needed. If your screening shows that you have symptoms (you have a positive screening result), you may need further evaluation and be referred to a mental health care  provider.  Diabetes self-management education  Education about how to manage your diabetes is recommended at diagnosis and ongoing as needed.  Treatment plan  Your treatment plan will be reviewed at every medical visit.  Summary  Managing diabetes (diabetes mellitus) can be complicated. Your diabetes treatment may be managed by a team of health care providers.  Your health care providers follow a schedule in order to help you get the best quality of care.  Standards of care including having regular physical exams, blood tests, blood pressure monitoring, immunizations, screening tests, and education about how to manage your diabetes.  Your health care providers may also give you more specific instructions based on your individual health.      Type 2 Diabetes Mellitus, Self Care, Adult Caring for yourself after you have  been diagnosed with type 2 diabetes (type 2 diabetes mellitus) means keeping your blood sugar (glucose) under control with a balance of:  Nutrition.  Exercise.  Lifestyle changes.  Medicines or insulin, if necessary.  Support from your team of health care providers and others.  The following information explains what you need to know to manage your diabetes at home. What do I need to do to manage my blood glucose?  Check your blood glucose every day, as often as told by your health care provider.  Contact your health care provider if your blood glucose is above your target for 2 tests in a row.  Have your A1c (hemoglobin A1c) level checked at least two times a year, or as often as told by your health care provider. Your health care provider will set individualized treatment goals for you. Generally, the goal of treatment is to maintain the following blood glucose levels:  Before meals (preprandial): 80-130 mg/dL (4.4-7.2 mmol/L).  After meals (postprandial): below 180 mg/dL (10 mmol/L).  A1c level: less than 7%.  What do I need to know about  hyperglycemia and hypoglycemia? What is hyperglycemia? Hyperglycemia, also called high blood glucose, occurs when blood glucose is too high.Make sure you know the early signs of hyperglycemia, such as:  Increased thirst.  Hunger.  Feeling very tired.  Needing to urinate more often than usual.  Blurry vision.  What is hypoglycemia? Hypoglycemia, also called low blood glucose, occurswith a blood glucose level at or below 70 mg/dL (3.9 mmol/L). The risk for hypoglycemia increases during or after exercise, during sleep, during illness, and when skipping meals or not eating for a long time (fasting). It is important to know the symptoms of hypoglycemia and treat it right away. Always have a 15-gram rapid-acting carbohydrate snack with you to treat low blood glucose. Family members and close friends should also know the symptoms and should understand how to treat hypoglycemia, in case you are not able to treat yourself. What are the symptoms of hypoglycemia? Hypoglycemia symptoms can include:  Hunger.  Anxiety.  Sweating and feeling clammy.  Confusion.  Dizziness or feeling light-headed.  Sleepiness.  Nausea.  Increased heart rate.  Headache.  Blurry vision.  Seizure.  Nightmares.  Tingling or numbness around the mouth, lips, or tongue.  A change in speech.  Decreased ability to concentrate.  A change in coordination.  Restless sleep.  Tremors or shakes.  Fainting.  Irritability.  How do I treat hypoglycemia?  If you are alert and able to swallow safely, follow the 15:15 rule:  Take 15 grams of a rapid-acting carbohydrate. Rapid-acting options include: ? 1 tube of glucose gel. ? 3 glucose pills. ? 6-8 pieces of hard candy. ? 4 oz (120 mL) of fruit juice. ? 4 oz (120 mL) of regular (not diet) soda.  Check your blood glucose 15 minutes after you take the carbohydrate.  If the repeat blood glucose level is still at or below 70 mg/dL (3.9 mmol/L), take  15 grams of a carbohydrate again.  If your blood glucose level does not increase above 70 mg/dL (3.9 mmol/L) after 3 tries, seek emergency medical care.  After your blood glucose level returns to normal, eat a meal or a snack within 1 hour.  How do I treat severe hypoglycemia? Severe hypoglycemia is when your blood glucose level is at or below 54 mg/dL (3 mmol/L). Severe hypoglycemia is an emergency. Do not wait to see if the symptoms will go away. Get medical help  right away. Call your local emergency services (911 in the [place] Do not drive yourself to the hospital. If you have severe hypoglycemia and you cannot eat or drink, you may need an injection of glucagon. A family member or close friend should learn how to check your blood glucose and how to give you a glucagon injection. Ask your health care provider if you need to have an emergency glucagon injection kit available. Severe hypoglycemia may need to be treated in a hospital. The treatment may include getting glucose through an IV tube. You may also need treatment for the cause of your hypoglycemia. Can having diabetes put me at risk for other conditions? Having diabetes can put you at risk for other long-term (chronic) conditions, such as heart disease and kidney disease. Your health care provider may prescribe medicines to help prevent complications from diabetes. These medicines may include:  Aspirin.  Medicine to lower cholesterol.  Medicine to control blood pressure.  What else can I do to manage my diabetes? Take your diabetes medicines as told  If your health care provider prescribed insulin or diabetes medicines, take them every day.  Do not run out of insulin or other diabetes medicines that you take. Plan ahead so you always have these available.  If you use insulin, adjust your dosage based on how physically active you are and what foods you eat. Your health care provider will tell you how to adjust your dosage. Make  healthy food choices  The things that you eat and drink affect your blood glucose and your insulin dosage. Making good choices helps to control your diabetes and prevent other health problems. A healthy meal plan includes eating lean proteins, complex carbohydrates, fresh fruits and vegetables, low-fat dairy products, and healthy fats. Make an appointment to see a diet and nutrition specialist (registered dietitian) to help you create an eating plan that is right for you. Make sure that you:  Follow instructions from your health care provider about eating or drinking restrictions.  Drink enough fluid to keep your urine clear or pale yellow.  Eat healthy snacks between nutritious meals.  Track the carbohydrates that you eat. Do this by reading food labels and learning the standard serving sizes of foods.  Follow your sick day plan whenever you cannot eat or drink as usual. Make this plan in advance with your health care provider.  Stay active  Exercise regularly, as told by your health care provider. This may include:  Stretching and doing strength exercises, such as yoga or weightlifting, at least 2 times a week.  Doing at least 150 minutes of moderate-intensity or vigorous-intensity exercise each week. This could be brisk walking, biking, or water aerobics. ? Spread out your activity over at least 3 days of the week. ? Do not go more than 2 days in a row without doing some kind of physical activity.  When you start a new exercise or activity, work with your health care provider to adjust your insulin, medicines, or food intake as needed. Make healthy lifestyle choices  Do not use any tobacco products, such as cigarettes, chewing tobacco, and e-cigarettes. If you need help quitting, ask your health care provider.  If your health care provider says that alcohol is safe for you, limit alcohol intake to no more than 1 drink per day for nonpregnant women and 2 drinks per day for men. One  drink equals 12 oz of beer, 5 oz of wine, or 1 oz of hard liquor.  Learn to manage stress. If you need help with this, ask your health care provider. Care for your body   Keep your immunizations up to date. In addition to getting vaccinations as told by your health care provider, it is recommended that you get vaccinated against the following illnesses: ? The flu (influenza). Get a flu shot every year. ? Pneumonia. ? Hepatitis B.  Schedule an eye exam soon after your diagnosis, and then one time every year after that.  Check your skin and feet every day for cuts, bruises, redness, blisters, or sores. Schedule a foot exam with your health care provider once every year.  Brush your teeth and gums two times a day, and floss at least one time a day. Visit your dentist at least once every 6 months.  Maintain a healthy weight. General instructions  Take over-the-counter and prescription medicines only as told by your health care provider.  Share your diabetes management plan with people in your workplace, school, and household.  Check your urine for ketones when you are ill and as told by your health care provider.  Ask your health care provider: ? Do I need to meet with a diabetes educator? ? Where can I find a support group for people with diabetes?  Carry a medical alert card or wear medical alert jewelry.  Keep all follow-up visits as told by your health care provider. This is important. Where to find more information: For more information about diabetes, visit:  American Diabetes Association (ADA): www.diabetes.org  American Association of Diabetes Educators (AADE): www.diabeteseducator.org/patient-resources  This information is not intended to replace advice given to you by your health care provider. Make sure you discuss any questions you have with your health care provider. Document Released: 11/25/2015 Document Revised: 01/09/2016 Document Reviewed: 09/06/2015 Elsevier  Interactive Patient Education  2017 [place].      Blood Glucose Monitoring, Adult Monitoring your blood sugar (glucose) helps you manage your diabetes. It also helps you and your health care provider determine how well your diabetes management plan is working. Blood glucose monitoring involves checking your blood glucose as often as directed, and keeping a record (log) of your results over time. Why should I monitor my blood glucose? Checking your blood glucose regularly can:  Help you understand how food, exercise, illnesses, and medicines affect your blood glucose.  Let you know what your blood glucose is at any time. You can quickly tell if you are having low blood glucose (hypoglycemia) or high blood glucose (hyperglycemia).  Help you and your health care provider adjust your medicines as needed.  When should I check my blood glucose? Follow instructions from your health care provider about how often to check your blood glucose.   This may depend on:  The type of diabetes you have.  How well-controlled your diabetes is.  Medicines you are taking.  If you have type 1 diabetes:  Check your blood glucose at least 2 times a day.  Also check your blood glucose: ? Before every insulin injection. ? Before and after exercise. ? Between meals. ? 2 hours after a meal. ? Occasionally between 2:00 a.m. and 3:00 a.m., as directed. ? Before potentially dangerous tasks, like driving or using heavy machinery. ? At bedtime.  You may need to check your blood glucose more often, up to 6-10 times a day: ? If you use an insulin pump. ? If you need multiple daily injections (MDI). ? If your diabetes is not well-controlled. ? If you  are ill. ? If you have a history of severe hypoglycemia. ? If you have a history of not knowing when your blood glucose is getting low (hypoglycemia unawareness).  If you have type 2 diabetes:  If you take insulin or other diabetes medicines, check  your blood glucose at least 2 times a day.  If you are on intensive insulin therapy, check your blood glucose at least 4 times a day. Occasionally, you may also need to check between 2:00 a.m. and 3:00 a.m., as directed.  Also check your blood glucose: ? Before and after exercise. ? Before potentially dangerous tasks, like driving or using heavy machinery.  You may need to check your blood glucose more often if: ? Your medicine is being adjusted. ? Your diabetes is not well-controlled. ? You are ill.  What is a blood glucose log?  A blood glucose log is a record of your blood glucose readings. It helps you and your health care provider: ? Look for patterns in your blood glucose over time. ? Adjust your diabetes management plan as needed.  Every time you check your blood glucose, write down your result and notes about things that may be affecting your blood glucose, such as your diet and exercise for the day.  Most glucose meters store a record of glucose readings in the meter. Some meters allow you to download your records to a computer. How do I check my blood glucose? Follow these steps to get accurate readings of your blood glucose: Supplies needed   Blood glucose meter.  Test strips for your meter. Each meter has its own strips. You must use the strips that come with your meter.  A needle to prick your finger (lancet). Do not use lancets more than once.  A device that holds the lancet (lancing device).  A journal or log book to write down your results.  Procedure  Wash your hands with soap and water.  Prick the side of your finger (not the tip) with the lancet. Use a different finger each time.  Gently rub the finger until a small drop of blood appears.  Follow instructions that come with your meter for inserting the test strip, applying blood to the strip, and using your blood glucose meter.  Write down your result and any notes.  Alternative testing  sites  Some meters allow you to use areas of your body other than your finger (alternative sites) to test your blood.  If you think you may have hypoglycemia, or if you have hypoglycemia unawareness, do not use alternative sites. Use your finger instead.  Alternative sites may not be as accurate as the fingers, because blood flow is slower in these areas. This means that the result you get may be delayed, and it may be different from the result that you would get from your finger.  The most common alternative sites are: ? Forearm. ? Thigh. ? Palm of the hand.  Additional tips  Always keep your supplies with you.  If you have questions or need help, all blood glucose meters have a 24-hour hotline number that you can call. You may also contact your health care provider.  After you use a few boxes of test strips, adjust (calibrate) your blood glucose meter by following instructions that came with your meter.    The American Diabetes Association suggests the following targets for most nonpregnant adults with diabetes.  More or less stringent glycemic goals may be appropriate for each individual.  A1C:  Less than 7% A1C may also be reported as eAG: Less than 154 mg/dl Before a meal (preprandial plasma glucose): 80-130 mg/dl 1-2 hours after beginning of the meal (Postprandial plasma glucose)*: Less than 180 mg/dl  *Postprandial glucose may be targeted if A1C goals are not met despite reaching preprandial glucose goals.   GOALS in short:  The goals are for the Hgb A1C to be less than 7.0 & blood pressure to be less than 130/80.    It is recommended that all diabetics are educated on and follow a healthy diabetic diet, exercise for 30 minutes 3-4 times per week (walking, biking, swimming, or machine), monitor blood glucose readings and bring that record with you to be reviewed at your next office visit.     You should be checking fasting blood sugars- especially after you eat poorly  or eat really healthy, and also check 2 hour postprandial blood sugars after largest meal of the day.    Write these down and bring in your log at each office visit.    You will need to be seen every 3 months by the provider managing your Diabetes unless told otherwise by that provider.   You will need yearly eye exams from an eye specialist and foot exams to check the nerves of your feet.  Also, your urine should be checked yearly as well to make sure excess protein is not present.   If you are checking your blood pressure at home, please record it and bring it to your next office visit.    Follow the Dietary Approaches to Stop Hypertension (DASH) diet (3 servings of fruit and vegetables daily, whole grains, low sodium, low-fat proteins).  See below.    Lastly, when it comes to your cholesterol, the goal is to have the HDL (good cholesterol) >40, and the LDL (bad cholesterol) <100.   It is recommended that you follow a heart healthy, low saturated and trans-fat diet and exercise for 30 minutes at least 5 times a week.     (( Check out the DASH diet = 1.5 Gram Low Sodium Diet   A 1.5 gram sodium diet restricts the amount of sodium in the diet to no more than 1.5 g or 1500 mg daily.  The American Heart Association recommends Americans over the age of 92 to consume no more than 1500 mg of sodium each day to reduce the risk of developing high blood pressure.  Research also shows that limiting sodium may reduce heart attack and stroke risk.  Many foods contain sodium for flavor and sometimes as a preservative.  When the amount of sodium in a diet needs to be low, it is important to know what to look for when choosing foods and drinks.  The following includes some information and guidelines to help make it easier for you to adapt to a low sodium diet.    QUICK TIPS  Do not add salt to food.  Avoid convenience items and fast food.  Choose unsalted snack foods.  Buy lower sodium products, often labeled  as "lower sodium" or "no salt added."  Check food labels to learn how much sodium is in 1 serving.  When eating at a restaurant, ask that your food be prepared with less salt or none, if possible.    READING FOOD LABELS FOR SODIUM INFORMATION  The nutrition facts label is a good place to find how much sodium is in foods. Look for products with no more than 400 mg of  sodium per serving.  Remember that 1.5 g = 1500 mg.  The food label may also list foods as:  Sodium-free: Less than 5 mg in a serving.  Very low sodium: 35 mg or less in a serving.  Low-sodium: 140 mg or less in a serving.  Light in sodium: 50% less sodium in a serving. For example, if a food that usually has 300 mg of sodium is changed to become light in sodium, it will have 150 mg of sodium.  Reduced sodium: 25% less sodium in a serving. For example, if a food that usually has 400 mg of sodium is changed to reduced sodium, it will have 300 mg of sodium.    CHOOSING FOODS  Grains  Avoid: Salted crackers and snack items. Some cereals, including instant hot cereals. Bread stuffing and biscuit mixes. Seasoned rice or pasta mixes.  Choose: Unsalted snack items. Low-sodium cereals, oats, puffed wheat and rice, shredded wheat. English muffins and bread. Pasta.  Meats  Avoid: Salted, canned, smoked, spiced, pickled meats, including fish and poultry. Bacon, ham, sausage, cold cuts, hot dogs, anchovies.  Choose: Low-sodium canned tuna and salmon. Fresh or frozen meat, poultry, and fish.  Dairy  Avoid: Processed cheese and spreads. Cottage cheese. Buttermilk and condensed milk. Regular cheese.  Choose: Milk. Low-sodium cottage cheese. Yogurt. Sour cream. Low-sodium cheese.  Fruits and Vegetables  Avoid: Regular canned vegetables. Regular canned tomato sauce and paste. Frozen vegetables in sauces. Olives. Angie Fava. Relishes. Sauerkraut.  Choose: Low-sodium canned vegetables. Low-sodium tomato sauce and paste. Frozen or fresh vegetables.  Fresh and frozen fruit.  Condiments  Avoid: Canned and packaged gravies. Worcestershire sauce. Tartar sauce. Barbecue sauce. Soy sauce. Steak sauce. Ketchup. Onion, garlic, and table salt. Meat flavorings and tenderizers.  Choose: Fresh and dried herbs and spices. Low-sodium varieties of mustard and ketchup. Lemon juice. Tabasco sauce. Horseradish.    SAMPLE 1.5 GRAM SODIUM MEAL PLAN:   Breakfast / Sodium (mg)  1 cup low-fat milk / 143 mg  1 whole-wheat English muffin / 240 mg  1 tbs heart-healthy margarine / 153 mg  1 hard-boiled egg / 139 mg  1 small orange / 0 mg  Lunch / Sodium (mg)  1 cup raw carrots / 76 mg  2 tbs no salt added peanut butter / 5 mg  2 slices whole-wheat bread / 270 mg  1 tbs jelly / 6 mg   cup red grapes / 2 mg  Dinner / Sodium (mg)  1 cup whole-wheat pasta / 2 mg  1 cup low-sodium tomato sauce / 73 mg  3 oz lean ground beef / 57 mg  1 small side salad (1 cup raw spinach leaves,  cup cucumber,  cup yellow bell pepper) with 1 tsp olive oil and 1 tsp red wine vinegar / 25 mg  Snack / Sodium (mg)  1 container low-fat vanilla yogurt / 107 mg  3 graham cracker squares / 127 mg  Nutrient Analysis  Calories: 1745  Protein: 75 g  Carbohydrate: 237 g  Fat: 57 g  Sodium: 1425 mg  Document Released: 08/03/2005 Document Revised: 04/15/2011 Document Reviewed: 11/04/2009  ExitCare Patient Information 2012 [place].))    This information is not intended to replace advice given to you by your health care provider. Make sure you discuss any questions you have with your health care provider. Document Released: 08/06/2003 Document Revised: 02/21/2016 Document Reviewed: 01/13/2016 Elsevier Interactive Patient Education  2017 [employer].

## 2018-08-08 ENCOUNTER — Ambulatory Visit (INDEPENDENT_AMBULATORY_CARE_PROVIDER_SITE_OTHER): Payer: Medicare Other | Admitting: Family Medicine

## 2018-08-08 ENCOUNTER — Encounter: Payer: Self-pay | Admitting: Family Medicine

## 2018-08-08 VITALS — BP 129/85 | HR 81 | Temp 98.7°F | Ht 69.0 in | Wt 241.4 lb

## 2018-08-08 DIAGNOSIS — E1159 Type 2 diabetes mellitus with other circulatory complications: Secondary | ICD-10-CM | POA: Diagnosis not present

## 2018-08-08 DIAGNOSIS — Z23 Encounter for immunization: Secondary | ICD-10-CM

## 2018-08-08 DIAGNOSIS — I10 Essential (primary) hypertension: Secondary | ICD-10-CM | POA: Diagnosis not present

## 2018-08-08 DIAGNOSIS — E785 Hyperlipidemia, unspecified: Secondary | ICD-10-CM | POA: Diagnosis not present

## 2018-08-08 DIAGNOSIS — N529 Male erectile dysfunction, unspecified: Secondary | ICD-10-CM | POA: Diagnosis not present

## 2018-08-08 DIAGNOSIS — Z794 Long term (current) use of insulin: Secondary | ICD-10-CM | POA: Diagnosis not present

## 2018-08-08 DIAGNOSIS — I152 Hypertension secondary to endocrine disorders: Secondary | ICD-10-CM

## 2018-08-08 DIAGNOSIS — E1169 Type 2 diabetes mellitus with other specified complication: Secondary | ICD-10-CM | POA: Diagnosis not present

## 2018-08-08 LAB — POCT GLYCOSYLATED HEMOGLOBIN (HGB A1C): HEMOGLOBIN A1C: 7.3 % — AB (ref 4.0–5.6)

## 2018-08-08 MED ORDER — TADALAFIL 20 MG PO TABS: 20.0000 mg | ORAL_TABLET | Freq: Every day | ORAL | 11 refills | Status: DC | PRN

## 2018-08-08 NOTE — Progress Notes (Signed)
Impression and Recommendations:    1. Type 2 diabetes mellitus with other specified complication, with long-term current use of insulin ([name])   2. Hypertension associated with diabetes ([name])   3. Hyperlipidemia associated with type 2 diabetes mellitus ([name])   4. elevated BMI w sign co-morbidity   5. Erectile dysfunction, unspecified erectile dysfunction type   6. Flu vaccine need     - Need for flu vaccination.  - Need for welcome to Medicare visit-  told pt to make appt in near future.  1. Type 2 diabetes mellitus with other specified complication, with long-term current use of insulin ([name]) - Was 6.9 last check on 05/09/2018. - A1c today is 7.1.- stable and pt wishes to hold off on med change today - Discussed goal fasting blood sugar in the 120's-140's. - Continue treatment plan as prescribed.  See med list below. - Patient tolerating meds well without complication.  Denies S-E - He will get back to really working on diet and exercise in near future - Counseled patient on pathophysiology of disease and discussed various treatment options, which often includes dietary and lifestyle modifications as first line.  Importance of low carb/ketogenic diet discussed with patient in addition to regular exercise.   - Check FBS and 2 hours after the biggest meal of your day.  Keep log and bring in next OV for my review.   Also, if you ever feel poorly, please check your blood pressure and blood sugar, as one or the other could be the cause of your symptoms.  - Being a diabetic, you need yearly eye and foot exams. Make appt.for diabetic eye exam.  - Per patient, his last diabetic eye exam was performed in 2018. TOld to call for appt with Dr Alois Cliche- optho for this  2. Hypertension associated with diabetes ([name]) - Stable at this time. - Continue treatment plan as prescribed.  See med list below. - Patient tolerating meds well without complication.  Denies S-E - Will continue to  monitor. - Ambulatory BP monitoring encouraged. Keep log and bring in next OV  3. Erectile Dysfunction - Managed on Sildenafil - Per patient, tolerated Sildenafil well in the past. - Discussed risks and benefits, especially with regards to his DM and HTN treatment. - Refilled provided today.  4. Hyperlipidemia associated with type 2 diabetes mellitus ([name]) - Stable last check, 05/09/2018.  LDL at 45, but HDL low at 39 - Continue treatment plan as prescribed.  See med list below. - Patient tolerating meds well without complication.  Denies S-E  5. Mood - BMI of 35.65, Obesity - Discussed mood management in detail with patient today. - Pt denies need for med mgt- "its just a little more stressful at work this time of the year".  - Per patient, "I think if I can keep the sugars lower, I'm motivated to do better on everything." - Reviewed the "spokes of the wheel" of mood and health management.  Stressed the importance of ongoing prudent habits, including regular exercise, appropriate sleep hygiene, healthful dietary habits, and prayer/meditation to relax.  6. BMI Counseling Explained to patient what BMI refers to, and what it means medically.    Told patient to think about it as a "medical risk stratification measurement" and how increasing BMI is associated with increasing risk/ or worsening state of various diseases such as hypertension, hyperlipidemia, diabetes, premature OA, depression etc.  American Heart Association guidelines for healthy diet, basically Mediterranean diet, and exercise guidelines  of 30 minutes 5 days per week or more discussed in detail.  Health counseling performed.  All questions answered.  7. Lifestyle & Preventative Health Maintenance - Advised patient to continue working toward exercising to improve overall mental, physical, and emotional health.    - Encouraged patient to engage in daily physical activity, especially a formal exercise routine.  Recommended that  the patient eventually strive for at least 150 minutes of moderate cardiovascular activity per week according to guidelines established by the [facility].   - Healthy dietary habits encouraged, including low-carb, and high amounts of lean protein in diet.   - Patient should also consume adequate amounts of water.    Education and routine counseling performed. Handouts provided.  Orders Placed This Encounter  Procedures  . Flu vaccine HIGH DOSE PF (Fluzone High dose)  . POCT glycosylated hemoglobin (Hb A1C)  . HM Diabetes Foot Exam    Medications Discontinued During This Encounter  Medication Reason  . cyclobenzaprine (FLEXERIL) 10 MG tablet Completed Course  . tadalafil (CIALIS) 20 MG tablet Reorder      Meds ordered this encounter  Medications  . tadalafil (ADCIRCA/CIALIS) 20 MG tablet    Sig: Take 1 tablet (20 mg total) by mouth daily as needed.    Dispense:  10 tablet    Refill:  11    The patient was counseled, risk factors were discussed, anticipatory guidance given.  Gross side effects, risk and benefits, and alternatives of medications discussed with patient.  Patient is aware that all medications have potential side effects and we are unable to predict every side effect or drug-drug interaction that may occur.  Expresses verbal understanding and consents to current therapy plan and treatment regimen.   Return for DM, HTN etc  follow up every 14mo.    IF a1c not under 7.0 next ov-   will restart metfromin   Please see AVS handed out to patient at the end of our visit for further patient instructions/ counseling done pertaining to today's office visit.    Note:  This document was prepared using Dragon voice recognition software and may include unintentional dictation errors.   This document serves as a record of services personally performed by [name], DO. It was created on her behalf by Toni Amend, a trained medical scribe. The creation of this record is  based on the scribe's personal observations and the provider's statements to them.   I have reviewed the above medical documentation for accuracy and completeness and I concur.  [name], DO 08/08/2018 11:22 AM        Subjective:    Chief Complaint  Patient presents with  . Follow-up    Joseph Collier is a 67 y.o. male who presents to [place] [facility] today for Diabetes Management.    Patient plans to more than double what he's doing now.  Currently doing "next-to-nothing."  Feels he's been a little depressed, and then his blood sugars went up, his weight went up, and he felt like things got more and more difficult, along with stress at work.  Feels that when his sugars go up, "it drives him crazy."  If his sugars are off, he feels frustrated and unmotivated to make improvements.  DM HPI: -  He has not been working on diet and exercise for diabetes.  Feels his diabetes has been worse over the holidays, especially since he has gained weight.  He has gained about 15-16 pounds.  He has not been walking as much.  Pt is currently maintained on the following medications for diabetes:   see med list today Medication compliance - He continues on medications as prescribed.  Home glucose readings range:  Sugars have been "too" high at home. States that his fasting sugars have been around 200.   Denies polyuria/polydipsia. Denies hypo/ hyperglycemia symptoms - He denies new onset of: chest pain, exercise intolerance, shortness of breath, dizziness, visual changes, headache, lower extremity swelling or claudication.   Per patient, last diabetic eye exam was in 2018. Lab Results  Component Value Date   HMDIABEYEEXA No Retinopathy 05/05/2016    Foot exam- UTD  Last A1C in the office was:  Lab Results  Component Value Date   HGBA1C 7.3 (A) 08/08/2018   HGBA1C 6.9 (H) 05/09/2018   HGBA1C 5.7 (H) 02/03/2018    Lab Results  Component Value Date    MICROALBUR 30 11/08/2017   LDLCALC 45 05/09/2018   CREATININE 0.94 05/09/2018      Last 3 blood pressure readings in our office are as follows: BP Readings from Last 3 Encounters:  08/08/18 129/85  05/09/18 136/88  02/08/18 125/78    BMI Readings from Last 3 Encounters:  08/08/18 35.65 kg/m  05/09/18 33.34 kg/m  02/08/18 34.11 kg/m     No problems updated.    Patient Care Team    Relationship Specialty Notifications Start End  [name], DO PCP - General Family Medicine  02/27/16      Patient Active Problem List   Diagnosis Date Noted  . Family history of cardiovascular disease 05/24/2015    Priority: High  . Diabetes mellitus, type II ([place]) 05/24/2015    Priority: High  . Hyperlipidemia associated with type 2 diabetes mellitus ([place]) 09/05/2012    Priority: High  . Hypertension associated with diabetes ([place]) 09/05/2012    Priority: High  . Class 1 obesity due to excess calories with serious comorbidity in adult 03/07/2016    Priority: Medium  . Physically inactive 12/23/2016    Priority: Low  . History of tobacco abuse- less than 30 pack year history quit 1991 03/07/2016    Priority: Low  . Elevated lipase 02/08/2018  . Personal history of colonic polyps 03/07/2016  . Allergic rhinitis 03/07/2016  . History of colonic polyps 05/24/2015  . Erectile dysfunction 05/10/2014  . History of melanoma 02/05/2014  . GERD (gastroesophageal reflux disease) 02/05/2014  . Degenerative disc disease, cervical 10/30/2013  . History of surgical procedure 10/07/2011     Past Medical History:  Diagnosis Date  . Allergic rhinitis, cause unspecified   . Anemia, unspecified   . Arthritis   . Cancer ([place]) 08/17/2006   Melanoma  . Essential hypertension, benign   . Impotence of organic origin 04/14/2007  . Obesity, unspecified   . Other and unspecified hyperlipidemia   . Personal history of colonic polyps   . Skin lesion 04/14/2007   melanoma     Past  Surgical History:  Procedure Laterality Date  . APPENDECTOMY    . arthroscopy of knee  2008   Left Knee  . COLONOSCOPY  08/18/2007   normal. [name].  Marland Kitchen HERNIA REPAIR     age 4  . JOINT REPLACEMENT    . melanoma of forearm resection  1992   Left        [name]  . TONSILLECTOMY AND ADENOIDECTOMY       Family History  Problem Relation Age of Onset  . Coronary  artery disease Father   . Hypertension Father   . Heart disease Father        AMI cause of death  . Diabetes Father   . Cancer Mother 24       lung  . Hypertension Brother   . Diabetes Brother      Social History   Substance and Sexual Activity  Drug Use No  ,  Social History   Substance and Sexual Activity  Alcohol Use Yes  . Alcohol/week: 5.0 standard drinks  . Types: 5 Cans of beer per week   Comment: moderate: drinks hard liquor 7 drinks per month in 08/2011/4 drinks 4 a week  ,  Social History   Tobacco Use  Smoking Status Former Smoker  . Packs/day: 1.00  . Years: 25.00  . Pack years: 25.00  . Types: Cigarettes  . Last attempt to quit: 08/17/1989  . Years since quitting: 28.9  Smokeless Tobacco Never Used  Tobacco Comment   quit 1991  ,    Current Outpatient Medications on File Prior to Visit  Medication Sig Dispense Refill  . aspirin EC 81 MG tablet Take 81 mg by mouth daily.    . Dulaglutide 1.5 MG/0.5ML SOPN Inject 1.5 mg into the skin once a week. 12 pen 3  . fluticasone (FLONASE) 50 MCG/ACT nasal spray Place 2 sprays into both nostrils daily.    Marland Kitchen glucose blood test strip Use as instructed 100 each 12  . lisinopril (PRINIVIL,ZESTRIL) 40 MG tablet TAKE 1 TABLET BY MOUTH EVERY DAY 90 tablet 1  . omeprazole (PRILOSEC) 20 MG capsule TAKE 1 CAPSULE BY MOUTH EVERY DAY 90 capsule 1  . simvastatin (ZOCOR) 20 MG tablet TAKE 1 TABLET BY MOUTH DAILY AT 6 PM. 90 tablet 1   No current facility-administered medications on file prior to visit.      Allergies  Allergen Reactions  . Penicillins Hives  and Swelling  . Tetracyclines & Related Hives and Swelling     Review of Systems:   General:  Denies fever, chills Optho/Auditory:   Denies visual changes, blurred vision Respiratory:   Denies SOB, cough, wheeze, DIB  Cardiovascular:   Denies chest pain, palpitations, painful respirations Gastrointestinal:   Denies nausea, vomiting, diarrhea.  Endocrine:     Denies new hot or cold intolerance Musculoskeletal:  Denies joint swelling, gait issues, or new unexplained myalgias/ arthralgias Skin:  Denies rash, suspicious lesions  Neurological:    Denies dizziness, unexplained weakness, numbness  Psychiatric/Behavioral:   Denies mood changes    Objective:     Blood pressure 129/85, pulse 81, temperature 98.7 F (37.1 C), height 5\' 9"  (1.753 m), weight 241 lb 6.4 oz (109.5 kg), SpO2 97 %.  Body mass index is 35.65 kg/m.  General: Well Developed, well nourished, and in no acute distress.  HEENT: Normocephalic, atraumatic, pupils equal round reactive to light, neck supple, No carotid bruits, no JVD Skin: Warm and dry, cap RF less 2 sec Cardiac: Regular rate and rhythm, S1, S2 WNL's, no murmurs rubs or gallops Respiratory: ECTA B/L, Not using accessory muscles, speaking in full sentences. NeuroM-Sk: Ambulates w/o assistance, moves ext * 4 w/o difficulty, sensation grossly intact.  Ext: scant edema b/l lower ext Psych: No HI/SI, judgement and insight good, Euthymic mood. Full Affect.

## 2018-09-09 ENCOUNTER — Other Ambulatory Visit: Payer: Self-pay | Admitting: Family Medicine

## 2018-09-09 DIAGNOSIS — Z794 Long term (current) use of insulin: Principal | ICD-10-CM

## 2018-09-09 DIAGNOSIS — E1169 Type 2 diabetes mellitus with other specified complication: Secondary | ICD-10-CM

## 2018-09-27 ENCOUNTER — Telehealth: Payer: Self-pay

## 2018-09-27 ENCOUNTER — Telehealth: Payer: Self-pay | Admitting: Family Medicine

## 2018-09-27 NOTE — Telephone Encounter (Signed)
Patient called request to change from cialis to viagra due to cost.  Cialis was written for on 08/08/2018  Patient never picked up because of the cost.  Please review and advise. MPulliam, CMA/RT(R)

## 2018-09-27 NOTE — Telephone Encounter (Signed)
Patient is requesting a refill of his Cialis, but he is wanting to move to the generic brand since it is more cost effective than the name brand if possible. If approved please send order to [facility] on [address]

## 2018-09-27 NOTE — Telephone Encounter (Signed)
Patient is requesting a change in medications due to cost.  Sent request to Dr. Raliegh Scarlet to review.  Please see phone note. MPulliam, CMA/RT(R)

## 2018-09-28 NOTE — Telephone Encounter (Signed)
Patient has never had RX for viagra in the past. MPulliam, CMA/RT(R)

## 2018-09-28 NOTE — Telephone Encounter (Signed)
Ask pt if he was on viagra in past and what dose please

## 2018-09-28 NOTE — Telephone Encounter (Signed)
Thanks for checking on that!  I am not sure where it would be cheapest for patient - it might be [facility] or others but he should call around to see where the cheapest cost for him is.  Once you know that, please send in below prescription to patient's pharmacy of choice.  Thank you.  Sildenafil 20 mg 1-3 tabs prior to intercourse PRN. Dispense 30 no refill

## 2018-09-30 ENCOUNTER — Other Ambulatory Visit: Payer: Self-pay | Admitting: Family Medicine

## 2018-09-30 DIAGNOSIS — Z794 Long term (current) use of insulin: Principal | ICD-10-CM

## 2018-09-30 DIAGNOSIS — E1169 Type 2 diabetes mellitus with other specified complication: Secondary | ICD-10-CM

## 2018-10-01 ENCOUNTER — Other Ambulatory Visit: Payer: Self-pay | Admitting: Family Medicine

## 2018-10-01 DIAGNOSIS — E1169 Type 2 diabetes mellitus with other specified complication: Secondary | ICD-10-CM

## 2018-10-01 DIAGNOSIS — Z794 Long term (current) use of insulin: Principal | ICD-10-CM

## 2018-10-06 ENCOUNTER — Other Ambulatory Visit: Payer: Self-pay

## 2018-10-06 MED ORDER — SILDENAFIL CITRATE 20 MG PO TABS: ORAL_TABLET | ORAL | 0 refills | Status: DC

## 2018-10-18 DIAGNOSIS — Z719 Counseling, unspecified: Secondary | ICD-10-CM | POA: Insufficient documentation

## 2018-10-31 ENCOUNTER — Other Ambulatory Visit: Payer: Self-pay | Admitting: Family Medicine

## 2018-10-31 DIAGNOSIS — E1169 Type 2 diabetes mellitus with other specified complication: Secondary | ICD-10-CM

## 2018-10-31 DIAGNOSIS — Z794 Long term (current) use of insulin: Principal | ICD-10-CM

## 2018-11-14 ENCOUNTER — Ambulatory Visit (INDEPENDENT_AMBULATORY_CARE_PROVIDER_SITE_OTHER): Payer: Medicare Other | Admitting: Family Medicine

## 2018-11-14 ENCOUNTER — Encounter: Payer: Self-pay | Admitting: Family Medicine

## 2018-11-14 ENCOUNTER — Other Ambulatory Visit: Payer: Self-pay

## 2018-11-14 VITALS — BP 129/79 | HR 78 | Wt 239.0 lb

## 2018-11-14 DIAGNOSIS — E785 Hyperlipidemia, unspecified: Secondary | ICD-10-CM | POA: Diagnosis not present

## 2018-11-14 DIAGNOSIS — Z794 Long term (current) use of insulin: Secondary | ICD-10-CM | POA: Diagnosis not present

## 2018-11-14 DIAGNOSIS — E1169 Type 2 diabetes mellitus with other specified complication: Secondary | ICD-10-CM | POA: Diagnosis not present

## 2018-11-14 DIAGNOSIS — E1159 Type 2 diabetes mellitus with other circulatory complications: Secondary | ICD-10-CM | POA: Diagnosis not present

## 2018-11-14 DIAGNOSIS — I10 Essential (primary) hypertension: Secondary | ICD-10-CM | POA: Diagnosis not present

## 2018-11-14 DIAGNOSIS — I152 Hypertension secondary to endocrine disorders: Secondary | ICD-10-CM

## 2018-11-14 DIAGNOSIS — N529 Male erectile dysfunction, unspecified: Secondary | ICD-10-CM

## 2018-11-14 MED ORDER — DULAGLUTIDE 1.5 MG/0.5ML ~~LOC~~ SOAJ: SUBCUTANEOUS | 1 refills | Status: DC

## 2018-11-14 MED ORDER — SILDENAFIL CITRATE 20 MG PO TABS: ORAL_TABLET | ORAL | 0 refills | Status: DC

## 2018-11-14 NOTE — Progress Notes (Signed)
Virtual Visit via Telephone Note for [employer], D.O- Primary Care Physician at [facility]   I connected with current patient today by telephone and verified that I am speaking with the correct person using two identifiers.   I discussed the limitations, risks, security and privacy concerns of performing an evaluation and management service by telephone and the limited availability of in person appointments during this current national crisis of the Covid-19 pandemic.  My staff members also discussed with the patient that there may be a patient responsible charge related to this service.  The patient expressed understanding and agreed to proceed.     History of Present Illness:  HPI:   Dm: fbs 113-135 on ave mostly in the 120's.  NO complaints.  Taking trulicity.  NO S-e  BP:  Good control;  Got a monitor recently.   130/80 on ave.  Been checking   Mood/ stress:  Well controlled; no need for meds; d/c pt exercise etc.   ED: new ED med working good- much cheaper than other one he was taking.  Only taking 40mg  at a time. No S-E    Wt Readings from Last 3 Encounters:  11/14/18 239 lb (108.4 kg)  08/08/18 241 lb 6.4 oz (109.5 kg)  05/09/18 225 lb 12.8 oz (102.4 kg)   BP Readings from Last 3 Encounters:  11/14/18 129/79  08/08/18 129/85  05/09/18 136/88   Pulse Readings from Last 3 Encounters:  11/14/18 78  08/08/18 81  05/09/18 85   BMI Readings from Last 3 Encounters:  11/14/18 35.29 kg/m  08/08/18 35.65 kg/m  05/09/18 33.34 kg/m     Patient Care Team    Relationship Specialty Notifications Start End  [name], DO PCP - General Family Medicine  02/27/16      Patient Active Problem List   Diagnosis Date Noted  . Family history of cardiovascular disease 05/24/2015    Priority: High  . Type 2 diabetes mellitus with other specified complication ([place]) 20/25/4270    Priority: High  . Hyperlipidemia associated with type 2 diabetes mellitus  ([place]) 09/05/2012    Priority: High  . Hypertension associated with diabetes ([place]) 09/05/2012    Priority: High  . Morbid obesity ([place]) 03/07/2016    Priority: Medium  . Physically inactive 12/23/2016    Priority: Low  . History of tobacco abuse- less than 30 pack year history quit 1991 03/07/2016    Priority: Low  . Health education/counseling 10/18/2018  . Elevated lipase 02/08/2018  . Personal history of colonic polyps 03/07/2016  . Allergic rhinitis 03/07/2016  . History of colonic polyps 05/24/2015  . Erectile dysfunction 05/10/2014  . History of melanoma 02/05/2014  . GERD (gastroesophageal reflux disease) 02/05/2014  . Degenerative disc disease, cervical 10/30/2013  . History of surgical procedure 10/07/2011     Current Meds  Medication Sig  . aspirin EC 81 MG tablet Take 81 mg by mouth daily.  . Dulaglutide (TRULICITY) 1.5 WC/3.7SE SOPN INJECT 1.5MG  SUBCUTANEOUSLY ONCE A WEEK  . fluticasone (FLONASE) 50 MCG/ACT nasal spray Place 2 sprays into both nostrils daily.  Marland Kitchen glucose blood test strip Use as instructed  . lisinopril (PRINIVIL,ZESTRIL) 40 MG tablet TAKE 1 TABLET BY MOUTH EVERY DAY  . omeprazole (PRILOSEC) 20 MG capsule TAKE 1 CAPSULE BY MOUTH EVERY DAY  . sildenafil (REVATIO) 20 MG tablet 1-3 tabs prior to intercourse as needed  . simvastatin (ZOCOR) 20 MG tablet TAKE 1 TABLET BY MOUTH DAILY AT  6 PM.  . [DISCONTINUED] sildenafil (REVATIO) 20 MG tablet 1-3 tabs prior to intercourse as needed  . [DISCONTINUED] TRULICITY 1.5 ZO/1.0RU SOPN INJECT 1.5MG  SUBCUTANEOUSLY ONCE A WEEK     Allergies:  Allergies  Allergen Reactions  . Penicillins Hives and Swelling  . Tetracyclines & Related Hives and Swelling     ROS:  See above HPI for pertinent positives and negatives   Objective:   Blood pressure 129/79, pulse 78, weight 239 lb (108.4 kg). Body mass index is 35.29 kg/m.   General: sounds in no acute distress.  Skin: Pt confirms warm and dry  extremities and  pink fingertips Respiratory: speaking in full sentences, no conversational dyspnea Psych: A and O *3, appears insight good, mood- full      Impression and Recommendations:     1. Type 2 diabetes mellitus with other specified complication, unspecified whether long term insulin use (HCC) Well cntrolled BS at home- make sure checking when eat poorly and healthy - d/c pt important dietary mod;  D/c pt Symogi effect etc - check A1c 3 mo - don't worry about eye exam right now in covid crisis - Dulaglutide (TRULICITY) 1.5 EA/5.4UJ SOPN; INJECT 1.5MG  SUBCUTANEOUSLY ONCE A WEEK  Dispense: 12 pen; Refill: 1  2. Hypertension associated with diabetes ([place]) Well controlled  3. Hyperlipidemia associated with type 2 diabetes mellitus ([place]) tol meds well  4. elevated BMI w sign co-morbidity Explained to patient what BMI refers to, and what it means medically.    Told patient to think about it as a "medical risk stratification measurement" and how increasing BMI is associated with increasing risk/ or worsening state of various diseases such as hypertension, hyperlipidemia, diabetes, premature OA, depression etc.  American Heart Association guidelines for healthy diet, basically Mediterranean diet, and exercise guidelines of 30 minutes 5 days per week or more discussed in detail.  Health counseling performed.  All questions answered.  5. Erectile dysfunction, unspecified erectile dysfunction type Cont meds- RF  - sildenafil (REVATIO) 20 MG tablet; 1-3 tabs prior to intercourse as needed  Dispense: 30 tablet; Refill: 0    I discussed the assessment and treatment plan with the patient. The patient was provided an opportunity to ask questions and all were answered. The patient agreed with the plan and demonstrated an understanding of the instructions.   The patient was advised to call back or seek an in-person evaluation if the symptoms worsen or if the condition fails to improve as  anticipated.     Meds ordered this encounter  Medications  . Dulaglutide (TRULICITY) 1.5 WJ/1.9JY SOPN    Sig: INJECT 1.5MG  SUBCUTANEOUSLY ONCE A WEEK    Dispense:  12 pen    Refill:  1  . sildenafil (REVATIO) 20 MG tablet    Sig: 1-3 tabs prior to intercourse as needed    Dispense:  30 tablet    Refill:  0    Medications Discontinued During This Encounter  Medication Reason  . tadalafil (ADCIRCA/CIALIS) 20 MG tablet Not covered by the pt's insurance  . TRULICITY 1.5 NW/2.9FA SOPN Reorder  . sildenafil (REVATIO) 20 MG tablet Reorder      Return for told pt to call for appt in 21mo-  DM, BP, etc- will need A1c.  Gross side effects, risk and benefits, and alternatives of medications and treatment plan in general discussed with patient.  Patient is aware that all medications have potential side effects and we are unable to predict every side effect or drug-drug  interaction that may occur.   Patient was strongly encouraged to call with any questions or concerns they may have concerns.    Expresses verbal understanding and consents to current therapy and treatment regimen.  No barriers to understanding were identified.  Red flag symptoms and signs discussed in detail.  Patient expressed understanding regarding what to do in case of emergency\urgent symptoms  Please see AVS handed out to patient at the end of our visit for further patient instructions/ counseling done pertaining to today's office visit.  I provided 22 minutes 13 seconds of non-face-to-face time during this encounter.     [name], DO

## 2018-11-25 ENCOUNTER — Other Ambulatory Visit: Payer: Self-pay | Admitting: Family Medicine

## 2019-01-04 ENCOUNTER — Other Ambulatory Visit: Payer: Self-pay | Admitting: Family Medicine

## 2019-01-07 ENCOUNTER — Other Ambulatory Visit: Payer: Self-pay | Admitting: Family Medicine

## 2019-01-07 DIAGNOSIS — E7800 Pure hypercholesterolemia, unspecified: Secondary | ICD-10-CM

## 2019-01-23 ENCOUNTER — Other Ambulatory Visit: Payer: Self-pay | Admitting: Family Medicine

## 2019-01-23 DIAGNOSIS — K219 Gastro-esophageal reflux disease without esophagitis: Secondary | ICD-10-CM

## 2019-02-24 ENCOUNTER — Other Ambulatory Visit: Payer: Self-pay | Admitting: Family Medicine

## 2019-03-13 ENCOUNTER — Encounter: Payer: Self-pay | Admitting: Family Medicine

## 2019-03-14 ENCOUNTER — Encounter: Payer: Self-pay | Admitting: Family Medicine

## 2019-03-14 ENCOUNTER — Ambulatory Visit (INDEPENDENT_AMBULATORY_CARE_PROVIDER_SITE_OTHER): Payer: Medicare Other | Admitting: Family Medicine

## 2019-03-14 ENCOUNTER — Other Ambulatory Visit: Payer: Self-pay

## 2019-03-14 VITALS — BP 120/80 | Temp 97.8°F | Ht 69.0 in | Wt 233.0 lb

## 2019-03-14 DIAGNOSIS — E1169 Type 2 diabetes mellitus with other specified complication: Secondary | ICD-10-CM

## 2019-03-14 DIAGNOSIS — E1159 Type 2 diabetes mellitus with other circulatory complications: Secondary | ICD-10-CM | POA: Diagnosis not present

## 2019-03-14 DIAGNOSIS — I10 Essential (primary) hypertension: Secondary | ICD-10-CM | POA: Diagnosis not present

## 2019-03-14 DIAGNOSIS — E785 Hyperlipidemia, unspecified: Secondary | ICD-10-CM | POA: Diagnosis not present

## 2019-03-14 DIAGNOSIS — I152 Hypertension secondary to endocrine disorders: Secondary | ICD-10-CM

## 2019-03-14 LAB — POCT GLYCOSYLATED HEMOGLOBIN (HGB A1C): Hemoglobin A1C: 9.1 % — AB (ref 4.0–5.6)

## 2019-03-14 MED ORDER — INVOKAMET 150-1000 MG PO TABS: 1.0000 | ORAL_TABLET | Freq: Two times a day (BID) | ORAL | 0 refills | Status: DC

## 2019-03-14 NOTE — Progress Notes (Signed)
Virtual / live video office visit note for [employer], D.O- Primary Care Physician at [facility]   I connected with current patient today and beyond visually recognizing the correct individual, I verified that I am speaking with the correct person using two identifiers.  . Location of the patient: Home . Location of the provider: Office Only the patient (+/- their family members at pt's discretion) and myself were participating in the encounter    - This visit type was conducted due to national recommendations for restrictions regarding the COVID-19 Pandemic (e.g. social distancing) in an effort to limit this patient's exposure and mitigate transmission in our community.  This format is felt to be most appropriate for this patient at this time.   - The patient did have access to video technology today yet, we had technical difficulties with this method, requiring transitioning to audio only.    - No physical exam could be performed with this format, beyond that communicated to [place] by the patient/ family members as noted.   - Additionally my office staff/ schedulers discussed with the patient that there may be a monetary charge related to this service, depending on patient's medical insurance.   The patient expressed understanding, and agreed to proceed.      History of Present Illness:  DM HPI:  -  He has not been working on diet and exercise for diabetes. He feels his diet may be "a touch" bad.  Says he stays at home more now.  He doesn't think he's gained any weight.  States he thinks he may have even lost a couple of pounds.  Medication compliance - taking Trulicity and nothing else.  Was formerly managed on glipizide, metformin, and Basaglar in addition in the past.  Per discussion, has not been on insulin for over a year.  He has never been on Victoza or Invokana, and has never had pancreatitis that he knows of.  Denies family history of thyroid cancers.  At-home A1c  test this morning was 9.1.  Home fasting glucose readings: patient reports that his sugars have been a "steady rise since our last conversation."  It "got up to over 200, and has more or less stayed steady there for some reason."  His fasting sugars first thing in the morning are over 200, this morning being 240.    He is not sure why his sugars are rising.   He is still working every day, still walking the same amount (2.5 miles per day), but states "the Trulicity isn't doing 295% of what it needs to do."  2 hr PP: not checking.   Denies polyuria/polydipsia.  Denies hypo/ hyperglycemia symptoms  Last diabetic eye exam was  Lab Results  Component Value Date   HMDIABEYEEXA No Retinopathy 05/05/2016    Last A1C in the office was:  Lab Results  Component Value Date   HGBA1C 9.1 (A) 03/14/2019   HGBA1C 7.3 (A) 08/08/2018   HGBA1C 6.9 (H) 05/09/2018    Lab Results  Component Value Date   MICROALBUR 30 11/08/2017   LDLCALC 45 05/09/2018   CREATININE 0.94 05/09/2018    Wt Readings from Last 3 Encounters:  04/10/19 229 lb 3.2 oz (104 kg)  03/14/19 233 lb (105.7 kg)  11/14/18 239 lb (108.4 kg)    BP Readings from Last 3 Encounters:  04/10/19 115/68  03/14/19 120/80  11/14/18 129/79     1. HTN HPI:  -  His blood pressure has  been controlled at home.  Pt is regularly checking it at home.   His most recent BP measurements have been around 120/80 and 117/70.  He confirms that 122-123/75 is a decent average BP for him.  - Patient reports good compliance with blood pressure medications  - Denies medication S-E   - Smoking Status noted   - He denies new onset of: chest pain, exercise intolerance, shortness of breath, dizziness, visual changes, headache, lower extremity swelling or claudication.   Last 3 blood pressure readings in our office are as follows: BP Readings from Last 3 Encounters:  04/10/19 115/68  03/14/19 120/80  11/14/18 129/79    Filed Weights    03/14/19 0807  Weight: 233 lb (105.7 kg)     2. 68 y.o. male here for cholesterol follow-up.   - Patient reports good compliance with medications or treatment plan.  Denies concerns with medication.  He continues taking it nightly.  - Denies medication S-E   - Smoking Status noted   - He denies new onset of: chest pain, exercise intolerance, shortness of breath, dizziness, visual changes, headache, lower extremity swelling or claudication.   Denies myalgias  The cholesterol last visit was:  Lab Results  Component Value Date   CHOL 95 (L) 05/09/2018   HDL 39 (L) 05/09/2018   LDLCALC 45 05/09/2018   TRIG 57 05/09/2018   CHOLHDL 2.4 05/09/2018    Hepatic Function Latest Ref Rng & Units 05/09/2018 02/03/2018 04/22/2017  Total Protein 6.0 - 8.5 g/dL 6.9 7.0 7.4  Albumin 3.6 - 4.8 g/dL 4.7 4.8 4.8  AST 0 - 40 IU/L '18 18 18  ' ALT 0 - 44 IU/L '14 28 20  ' Alk Phosphatase 39 - 117 IU/L 60 45 48  Total Bilirubin 0.0 - 1.2 mg/dL 0.5 0.5 0.7      Impression and Recommendations:    1. Type 2 diabetes mellitus with other specified complication, unspecified whether long term insulin use ([place])   2. Hypertension associated with diabetes ([place])   3. Hyperlipidemia associated with type 2 diabetes mellitus ([place])      - As part of my medical decision making, I reviewed the following data within the [place] History obtained from pt /family, CMA notes reviewed and incorporated if applicable, Labs reviewed, Radiograph/ tests reviewed if applicable and OV notes from prior OV's with me, as well as other specialists she/he has seen since seeing me last, were all reviewed and used in my medical decision making process today.   - Additionally, discussion had with patient regarding txmnt plan, their biases about that plan etc were used in my medical decision making today.   - The patient agreed with the plan and demonstrated an understanding of the instructions.   No barriers to  understanding were identified.   - Red flag symptoms and signs discussed in detail.  Patient expressed understanding regarding what to do in case of emergency\ urgent symptoms.  The patient was advised to call back or seek an in-person evaluation if the symptoms worsen or if the condition fails to improve as anticipated.   1. Diabetes Mellitus - [facility] at-home A1c test this morning was 9.1. - A1c in June of 2019 was 5.7. - DM currently managed on Trulicity alone, highest dose (1.5).  - Discussed the importance of possibly treating patient's diabetes with more than one concurrent medication.  Extensive education was provided and all questions were answered.  - Patient agrees to begin Invokamet today, twice daily.  See med list. - Discussed how different diabetes medicines lower sugar through different medicines. - Extensively reviewed risks and benefits of beginning medication.  Patient agrees to try it. - Patient states he has never had an issue with his pancreas and doesn't recall having pancreatitis. - Will continue to monitor patient's pancreatic function.  - Patient managed on Kombiglyze in the past. - This medication was a DPP4 class drug, and this is what made patient's lipase historically rise.  - Patient knows to consult with his insurance regarding options for invokamet, and whether it covers the combo pill or rather the two medications separately.  - Return in two weeks for update on blood sugars. - Discussed possibility of needing insulin in the future, depending on pancreatic damage.  - Counseled patient on pathophysiology of disease and discussed various treatment options, including dietary and lifestyle modifications as first line.  Importance of low prudent diet discussed with patient in addition to regular exercise.   - Check FBS and 2 hours after the biggest meal of your day.  Keep log and bring in next OV for my review.   Also, if you ever feel poorly, please check your  blood pressure and blood sugar, as one or the other could be the cause of your symptoms.  - Being a diabetic, you need yearly eye and foot exams. Make appt.for diabetic eye exam  2. Hypertension Associated with Diabetes - BP controlled at this time. - Continue treatment plan as prescribed.  See med list below. - Patient tolerating meds well without complication.  Denies S-E  - Ongoing ambulatory BP monitoring encouraged. Keep log and bring in next OV - Will continue to monitor.  3. Hyperlipidemia Associated with Diabetes - Managed on medications at this time. - Continue treatment plan as prescribed.  See med list below. - Patient tolerating meds well without complication.  Denies S-E  4. Recommendations - Need for full blood work in the near future. - Patient knows to call prior to next OV if he has any concerns about his treatment plan. - Follow up in 2-4 weeks.    Return for Patient will follow-up in 3-4 weeks-started canagliflozin-metformin combo pill.    Orders Placed This Encounter  Procedures  . POCT glycosylated hemoglobin (Hb A1C)    Meds ordered this encounter  Medications  . Canagliflozin-metFORMIN HCl (INVOKAMET) [phone] MG TABS    Sig: Take 1 tablet by mouth 2 (two) times a day.    Dispense:  60 tablet    Refill:  0    There are no discontinued medications.    I provided 24 minutes of non-face-to-face time during this encounter,with over 50% of the time in direct counseling on patients medical conditions/ medical concerns.  Additional time was spent with charting and coordination of care after the actual visit commenced.   Note:  This note was prepared with assistance of Dragon voice recognition software. Occasional wrong-word or sound-a-like substitutions may have occurred due to the inherent limitations of voice recognition software.  [name], DO  This document serves as a record of services personally performed by [name], DO. It was  created on her behalf by Toni Amend, a trained medical scribe. The creation of this record is based on the scribe's personal observations and the provider's statements to them.   I have reviewed the above medical documentation for accuracy and completeness and I concur.  [name], DO 04/10/2019 9:24 AM        Patient Care  Team    Relationship Specialty Notifications Start End  [name], DO PCP - General Family Medicine  02/27/16     -Vitals obtained; medications/ allergies reconciled;  personal medical, social, Sx etc.histories were updated by CMA, reviewed by me and are reflected in chart  Patient Active Problem List   Diagnosis Date Noted  . Family history of cardiovascular disease 05/24/2015    Priority: High  . Type 2 diabetes mellitus with other specified complication ([place]) 27/78/2423    Priority: High  . Hyperlipidemia associated with type 2 diabetes mellitus ([place]) 09/05/2012    Priority: High  . Hypertension associated with diabetes ([place]) 09/05/2012    Priority: High  . Morbid obesity ([place]) 03/07/2016    Priority: Medium  . Physically inactive 12/23/2016    Priority: Low  . History of tobacco abuse- less than 30 pack year history quit 1991 03/07/2016    Priority: Low  . Health education/counseling 10/18/2018  . Elevated lipase 02/08/2018  . Personal history of colonic polyps 03/07/2016  . Allergic rhinitis 03/07/2016  . History of colonic polyps 05/24/2015  . Erectile dysfunction 05/10/2014  . History of melanoma 02/05/2014  . GERD (gastroesophageal reflux disease) 02/05/2014  . Degenerative disc disease, cervical 10/30/2013  . History of surgical procedure 10/07/2011     Current Meds  Medication Sig  . aspirin EC 81 MG tablet Take 81 mg by mouth daily.  . Dulaglutide (TRULICITY) 1.5 NT/6.1WE SOPN INJECT 1.5MG SUBCUTANEOUSLY ONCE A WEEK  . fluticasone (FLONASE) 50 MCG/ACT nasal spray Place 2 sprays into both nostrils daily.  Marland Kitchen glucose  blood test strip Use as instructed  . omeprazole (PRILOSEC) 20 MG capsule Take 1 capsule by mouth once daily  . sildenafil (REVATIO) 20 MG tablet 1-3 tabs prior to intercourse as needed  . simvastatin (ZOCOR) 20 MG tablet TAKE 1 TABLET BY MOUTH ONCE DAILY AT 6PM  . [DISCONTINUED] lisinopril (ZESTRIL) 40 MG tablet Take 1 tablet (40 mg total) by mouth daily. Needs office visit for refills     Allergies  Allergen Reactions  . Penicillins Hives and Swelling  . Tetracyclines & Related Hives and Swelling     ROS:  See above HPI for pertinent positives and negatives   Objective:   Blood pressure 120/80, temperature 97.8 F (36.6 C), height '5\' 9"'  (1.753 m), weight 233 lb (105.7 kg).  (if some vitals are omitted, this means that patient was UNABLE to obtain them even though they were asked to get them prior to OV today.  They were asked to call us at their earliest convenience with these once obtained.)  General: A & O * 3; visually in no acute distress; in usual state of health.  Skin: Visible skin appears normal and pt's usual skin color HEENT:  EOMI, head is normocephalic and atraumatic.  Sclera are anicteric. Neck has a good range of motion.  Lips are noncyanotic Chest: normal chest excursion and movement Respiratory: speaking in full sentences, no conversational dyspnea; no use of accessory muscles Psych: insight good, mood- appears full

## 2019-03-29 ENCOUNTER — Encounter: Payer: Self-pay | Admitting: Family Medicine

## 2019-03-31 ENCOUNTER — Other Ambulatory Visit: Payer: Self-pay | Admitting: Family Medicine

## 2019-04-10 ENCOUNTER — Encounter: Payer: Self-pay | Admitting: Family Medicine

## 2019-04-10 ENCOUNTER — Ambulatory Visit (INDEPENDENT_AMBULATORY_CARE_PROVIDER_SITE_OTHER): Payer: Medicare Other | Admitting: Family Medicine

## 2019-04-10 ENCOUNTER — Other Ambulatory Visit: Payer: Self-pay

## 2019-04-10 DIAGNOSIS — K219 Gastro-esophageal reflux disease without esophagitis: Secondary | ICD-10-CM | POA: Diagnosis not present

## 2019-04-10 DIAGNOSIS — N529 Male erectile dysfunction, unspecified: Secondary | ICD-10-CM

## 2019-04-10 DIAGNOSIS — E7800 Pure hypercholesterolemia, unspecified: Secondary | ICD-10-CM

## 2019-04-10 DIAGNOSIS — E1169 Type 2 diabetes mellitus with other specified complication: Secondary | ICD-10-CM

## 2019-04-10 MED ORDER — SILDENAFIL CITRATE 20 MG PO TABS: ORAL_TABLET | ORAL | 0 refills | Status: DC

## 2019-04-10 MED ORDER — OMEPRAZOLE 20 MG PO CPDR: 20.0000 mg | DELAYED_RELEASE_CAPSULE | Freq: Every day | ORAL | 1 refills | Status: DC

## 2019-04-10 MED ORDER — SIMVASTATIN 20 MG PO TABS: 20.0000 mg | ORAL_TABLET | Freq: Every day | ORAL | 1 refills | Status: DC

## 2019-04-10 MED ORDER — TRULICITY 1.5 MG/0.5ML ~~LOC~~ SOAJ: SUBCUTANEOUS | 1 refills | Status: DC

## 2019-04-10 MED ORDER — LISINOPRIL 40 MG PO TABS: 40.0000 mg | ORAL_TABLET | Freq: Every day | ORAL | 1 refills | Status: DC

## 2019-04-10 MED ORDER — INVOKAMET 150-1000 MG PO TABS: 1.0000 | ORAL_TABLET | Freq: Two times a day (BID) | ORAL | 1 refills | Status: DC

## 2019-04-10 NOTE — Progress Notes (Signed)
Virtual / live video office visit note for [employer], D.O- Primary Care Physician at [facility]   I connected with current patient today and beyond visually recognizing the correct individual, I verified that I am speaking with the correct person using two identifiers.   Location of the patient: Home  Location of the provider: Office Only the patient (+/- their family members at pt's discretion) and myself were participating in the encounter    - This visit type was conducted due to national recommendations for restrictions regarding the COVID-19 Pandemic (e.g. social distancing) in an effort to limit this patient's exposure and mitigate transmission in our community.  This format is felt to be most appropriate for this patient at this time.   - The patient did have access to video technology today yet, we had technical difficulties with this method, requiring transitioning to audio only towards the end of visit.    - No physical exam could be performed with this format, beyond that communicated to [place] by the patient/ family members as noted.   - Additionally my office staff/ schedulers discussed with the patient that there may be a monetary charge related to this service, depending on patient's medical insurance.   The patient expressed understanding, and agreed to proceed.      History of Present Illness:  States three weeks ago, before starting the new blood sugar medicine, his blood sugar was running 279 or higher, but this morning he's down to 177, "so it seems to be coming down."  HTN: States his BP this morning was 115/68.  Confirms it is under control.  ED: Confirms sildenafil is working well as intended, with no S-E such as headaches, chest pains, or prolonged erections.  GERD: Reflux is controlled on Prilosec.  DM HPI:  -  He has been working on diet and exercise for diabetes  States his weight is down  Couple of pounds and his BP "seems okay."  In general,  states he feels much better.  Says "now that I feel a little better, I've been doing a little more walking, a little more stuff out in the yard where before I was letting it go."  Has been "doing more" overall.  Medication compliance - continues on medication as prescribed.  Home fasting glucose readings: Down 100 points;  2 hr PP: not checking Denies low blood sugars.  "Has been steadily coming down a couple of points every time I check."  Feels his starting point was 279, and down to just over 200 within a week, and "it steadily keeps coming down."   Denies polyuria/polydipsia.  Denies hypo/ hyperglycemia symptoms  Last diabetic eye exam was  Lab Results  Component Value Date   HMDIABEYEEXA No Retinopathy 05/05/2016    Last A1C in the office was:  Lab Results  Component Value Date   HGBA1C 9.1 (A) 03/14/2019   HGBA1C 7.3 (A) 08/08/2018   HGBA1C 6.9 (H) 05/09/2018    Lab Results  Component Value Date   MICROALBUR 30 11/08/2017   LDLCALC 45 05/09/2018   CREATININE 0.94 05/09/2018    Wt Readings from Last 3 Encounters:  04/10/19 229 lb 3.2 oz (104 kg)  03/14/19 233 lb (105.7 kg)  11/14/18 239 lb (108.4 kg)    BP Readings from Last 3 Encounters:  04/10/19 115/68  03/14/19 120/80  11/14/18 129/79     Impression and Recommendations:    1. Erectile dysfunction, unspecified erectile dysfunction type  2. Gastroesophageal reflux disease, esophagitis presence not specified   3. Pure hypercholesterolemia   4. Type 2 diabetes mellitus with other specified complication, unspecified whether long term insulin use ([place])     - Last seen on 03/14/2019.  - Need for full fasting lab work in near future.  Diabetes Mellitus - A1c 9.1 last check.  - 3 months from 03/14/2019, re-check A1c.  - Blood sugars decreasing on current management. - Continue treatment plan as prescribed.  See med list. - Patient tolerating meds well without S-E.  - Reviewed goal blood sugar of 140  fasting. - Patient will continue to monitor closely.  - Patient knows to call in if sugars remain 160's-170's, get in touch sooner to make changes to medication plan.  - Patient knows to watch for low blood sugars.  - Counseled patient on pathophysiology of disease and discussed various treatment options, which often includes dietary and lifestyle modifications as first line.  Importance of low carb/ketogenic diet discussed with patient in addition to regular exercise.   - Check FBS and 2 hours after the biggest meal of your day.  Keep log and bring in next OV for my review.   Also, if you ever feel poorly, please check your blood pressure and blood sugar, as one or the other could be the cause of your symptoms.  - Being a diabetic, you need yearly eye and foot exams. Make appt.for diabetic eye exam.  Hypertension associated with DM - Stable on current management. - Continue treatment plan as prescribed.  See med list below. - Patient tolerating meds well without complication.  Denies S-E  Lifestyle changes such as dash diet and engaging in a regular exercise program discussed with patient.  Educational handouts provided  Ambulatory BP monitoring encouraged. Keep log and bring in next OV.  - Ongoing ambulatory blood pressure monitoring encouraged. - Will continue to monitor.  Hyperlipidemia associated with DM - Stable last check. - Continue management as prescribed. See med list below. - Patient tolerating meds well without complication.  Denies S-E  - Prudent dietary changes such as low saturated & trans fat and low carb diets discussed with patient.  Encouraged regular exercise and weight loss when appropriate.   - Will re-check as recommended. - Continue to monitor.  Erectile Dysfunction  - Stable on current management of sildenafil. - Pt tolerating well.  Denies S-E. - Reviewed to monitor for sx such as headaches, chest pains, or prolonged erections. - Will continue to  monitor.  GERD - Controlled on Prilosec.  See med list. - Continue management as prescribed. - Will continue to monitor  BMI Counseling - BMI of 33.85 Explained to patient what BMI refers to, and what it means medically.    Told patient to think about it as a "medical risk stratification measurement" and how increasing BMI is associated with increasing risk/ or worsening state of various diseases such as hypertension, hyperlipidemia, diabetes, premature OA, depression etc.  American Heart Association guidelines for healthy diet, basically Mediterranean diet, and exercise guidelines of 30 minutes 5 days per week or more discussed in detail.  Health counseling performed.  All questions answered.  Lifestyle & Preventative Health Maintenance - Advised patient to continue working toward exercising to improve overall mental, physical, and emotional health.    - Reviewed the "spokes of the wheel" of mood and health management.  Stressed the importance of ongoing prudent habits, including regular exercise, appropriate sleep hygiene, healthful dietary habits, and prayer/meditation to relax.  -  Encouraged patient to engage in daily physical activity, especially a formal exercise routine.  Recommended that the patient eventually strive for at least 150 minutes of moderate cardiovascular activity per week according to guidelines established by the [facility].   - Healthy dietary habits encouraged, including low-carb, and high amounts of lean protein in diet.   - Patient should also consume adequate amounts of water.  Recommendations - Return in near future (around 06/14/2019) for welcome to medicare visit and full fasting lab work. - For refills, advised patient to search for deals, use [employer] and advocate for himself and his Rx's.  - As part of my medical decision making, I reviewed the following data within the [place] History obtained from pt /family, CMA notes reviewed and  incorporated if applicable, Labs reviewed, Radiograph/ tests reviewed if applicable and OV notes from prior OV's with me, as well as other specialists she/he has seen since seeing me last, were all reviewed and used in my medical decision making process today.   - Additionally, discussion had with patient regarding txmnt plan, their biases about that plan etc were used in my medical decision making today.   - The patient agreed with the plan and demonstrated an understanding of the instructions.   No barriers to understanding were identified.   - Red flag symptoms and signs discussed in detail.  Patient expressed understanding regarding what to do in case of emergency\ urgent symptoms.  The patient was advised to call back or seek an in-person evaluation if the symptoms worsen or if the condition fails to improve as anticipated.   Return for  in office end Oct for Welcome to Medicare exam and FBW.    No orders of the defined types were placed in this encounter.   Meds ordered this encounter  Medications   sildenafil (REVATIO) 20 MG tablet    Sig: 1-3 tabs prior to intercourse as needed    Dispense:  30 tablet    Refill:  0   omeprazole (PRILOSEC) 20 MG capsule    Sig: Take 1 capsule (20 mg total) by mouth daily.    Dispense:  90 capsule    Refill:  1   simvastatin (ZOCOR) 20 MG tablet    Sig: Take 1 tablet (20 mg total) by mouth at bedtime.    Dispense:  90 tablet    Refill:  1   Canagliflozin-metFORMIN HCl (INVOKAMET) [phone] MG TABS    Sig: Take 1 tablet by mouth 2 (two) times daily.    Dispense:  180 tablet    Refill:  1   Dulaglutide (TRULICITY) 1.5 0000000 SOPN    Sig: INJECT 1.5MG  SUBCUTANEOUSLY ONCE A WEEK    Dispense:  12 pen    Refill:  1   lisinopril (ZESTRIL) 40 MG tablet    Sig: Take 1 tablet (40 mg total) by mouth daily.    Dispense:  90 tablet    Refill:  1    Medications Discontinued During This Encounter  Medication Reason   Dulaglutide (TRULICITY)  1.5 0000000 SOPN Reorder   sildenafil (REVATIO) 20 MG tablet Reorder   simvastatin (ZOCOR) 20 MG tablet Reorder   omeprazole (PRILOSEC) 20 MG capsule Reorder   Canagliflozin-metFORMIN HCl (INVOKAMET) [phone] MG TABS Reorder   lisinopril (ZESTRIL) 40 MG tablet Reorder      I provided 18 minutes of non-face-to-face time during this encounter,with over 50% of the time in direct counseling on patients medical conditions/ medical concerns.  Additional  time was spent with charting and coordination of care after the actual visit commenced.   Note:  This note was prepared with assistance of Dragon voice recognition software. Occasional wrong-word or sound-a-like substitutions may have occurred due to the inherent limitations of voice recognition software.  [name], DO  This document serves as a record of services personally performed by [name], DO. It was created on her behalf by Toni Amend, a trained medical scribe. The creation of this record is based on the scribe's personal observations and the provider's statements to them.   I have reviewed the above medical documentation for accuracy and completeness and I concur.  [name], DO 04/10/2019 12:21 PM        Patient Care Team    Relationship Specialty Notifications Start End  [name], DO PCP - General Family Medicine  02/27/16     -Vitals obtained; medications/ allergies reconciled;  personal medical, social, Sx etc.histories were updated by CMA, reviewed by me and are reflected in chart  Patient Active Problem List   Diagnosis Date Noted   Family history of cardiovascular disease 05/24/2015    Priority: High   Type 2 diabetes mellitus with other specified complication ([place]) 123456    Priority: High   Hyperlipidemia associated with type 2 diabetes mellitus ([place]) 09/05/2012    Priority: High   Hypertension associated with diabetes ([place]) 09/05/2012    Priority: High   Morbid  obesity ([place]) 03/07/2016    Priority: Medium   Physically inactive 12/23/2016    Priority: Low   History of tobacco abuse- less than 30 pack year history quit 1991 03/07/2016    Priority: [place] education/counseling 10/18/2018   Elevated lipase 02/08/2018   Personal history of colonic polyps 03/07/2016   Allergic rhinitis 03/07/2016   History of colonic polyps 05/24/2015   Erectile dysfunction 05/10/2014   History of melanoma 02/05/2014   GERD (gastroesophageal reflux disease) 02/05/2014   Degenerative disc disease, cervical 10/30/2013   History of surgical procedure 10/07/2011     No outpatient medications have been marked as taking for the 04/10/19 encounter (Office Visit) with [name], DO.     Allergies  Allergen Reactions   Penicillins Hives and Swelling   Tetracyclines & Related Hives and Swelling     ROS:  See above HPI for pertinent positives and negatives   Objective:   Blood pressure 115/68, pulse 88, temperature 97.8 F (36.6 C), temperature source Oral, weight 229 lb 3.2 oz (104 kg).  (if some vitals are omitted, this means that patient was UNABLE to obtain them even though they were asked to get them prior to OV today.  They were asked to call us at their earliest convenience with these once obtained.)  General: A & O * 3; visually in no acute distress; in usual state of health.  Skin: Visible skin appears normal and pt's usual skin color HEENT:  EOMI, head is normocephalic and atraumatic.  Sclera are anicteric. Neck has a good range of motion.  Lips are noncyanotic Chest: normal chest excursion and movement Respiratory: speaking in full sentences, no conversational dyspnea; no use of accessory muscles Psych: insight good, mood- appears full

## 2019-05-23 ENCOUNTER — Other Ambulatory Visit: Payer: Self-pay | Admitting: Family Medicine

## 2019-05-23 DIAGNOSIS — E1169 Type 2 diabetes mellitus with other specified complication: Secondary | ICD-10-CM

## 2019-05-26 ENCOUNTER — Other Ambulatory Visit: Payer: Self-pay

## 2019-05-26 ENCOUNTER — Ambulatory Visit (INDEPENDENT_AMBULATORY_CARE_PROVIDER_SITE_OTHER): Payer: Medicare Other

## 2019-05-26 DIAGNOSIS — Z23 Encounter for immunization: Secondary | ICD-10-CM

## 2019-06-16 ENCOUNTER — Other Ambulatory Visit: Payer: Self-pay | Admitting: Family Medicine

## 2019-06-16 ENCOUNTER — Encounter: Payer: Self-pay | Admitting: Family Medicine

## 2019-06-16 ENCOUNTER — Ambulatory Visit (INDEPENDENT_AMBULATORY_CARE_PROVIDER_SITE_OTHER): Payer: Medicare Other | Admitting: Family Medicine

## 2019-06-16 ENCOUNTER — Other Ambulatory Visit: Payer: Self-pay

## 2019-06-16 VITALS — BP 115/69 | HR 99 | Temp 97.7°F | Resp 12 | Ht 69.0 in | Wt 230.0 lb

## 2019-06-16 DIAGNOSIS — E1169 Type 2 diabetes mellitus with other specified complication: Secondary | ICD-10-CM

## 2019-06-16 DIAGNOSIS — E1159 Type 2 diabetes mellitus with other circulatory complications: Secondary | ICD-10-CM

## 2019-06-16 DIAGNOSIS — R748 Abnormal levels of other serum enzymes: Secondary | ICD-10-CM | POA: Diagnosis not present

## 2019-06-16 DIAGNOSIS — Z87891 Personal history of nicotine dependence: Secondary | ICD-10-CM

## 2019-06-16 DIAGNOSIS — Z114 Encounter for screening for human immunodeficiency virus [HIV]: Secondary | ICD-10-CM

## 2019-06-16 DIAGNOSIS — Z86010 Personal history of colonic polyps: Secondary | ICD-10-CM

## 2019-06-16 DIAGNOSIS — I10 Essential (primary) hypertension: Secondary | ICD-10-CM

## 2019-06-16 DIAGNOSIS — Z794 Long term (current) use of insulin: Secondary | ICD-10-CM

## 2019-06-16 DIAGNOSIS — E785 Hyperlipidemia, unspecified: Secondary | ICD-10-CM | POA: Diagnosis not present

## 2019-06-16 DIAGNOSIS — Z0000 Encounter for general adult medical examination without abnormal findings: Secondary | ICD-10-CM

## 2019-06-16 DIAGNOSIS — Z1211 Encounter for screening for malignant neoplasm of colon: Secondary | ICD-10-CM | POA: Diagnosis not present

## 2019-06-16 DIAGNOSIS — Z23 Encounter for immunization: Secondary | ICD-10-CM | POA: Diagnosis not present

## 2019-06-16 LAB — POCT UA - MICROALBUMIN
Albumin/Creatinine Ratio, Urine, POC: <30
Creatinine, POC: 200 mg/dL
Microalbumin Ur, POC: 10 mg/L

## 2019-06-16 NOTE — Patient Instructions (Signed)
 Preventive Care 65 Years and Older, Male Preventive care refers to lifestyle choices and visits with your health care provider that can promote health and wellness. What does preventive care include?   A yearly physical exam. This is also called an annual well check.  Dental exams once or twice a year.  Routine eye exams. Ask your health care provider how often you should have your eyes checked.  Personal lifestyle choices, including: ? Daily care of your teeth and gums. ? Regular physical activity. ? Eating a healthy diet. ? Avoiding tobacco and drug use. ? Limiting alcohol use. ? Practicing safe sex. ? Taking low doses of aspirin every day. ? Taking vitamin and mineral supplements as recommended by your health care provider. What happens during an annual well check? The services and screenings done by your health care provider during your annual well check will depend on your age, overall health, lifestyle risk factors, and family history of disease. Counseling Your health care provider may ask you questions about your:  Alcohol use.  Tobacco use.  Drug use.  Emotional well-being.  Home and relationship well-being.  Sexual activity.  Eating habits.  History of falls.  Memory and ability to understand (cognition).  Work and work environment. Screening You may have the following tests or measurements:  Height, weight, and BMI.  Blood pressure.  Lipid and cholesterol levels. These may be checked every 5 years, or more frequently if you are over 50 years old.  Skin check.  Lung cancer screening. You may have this screening every year starting at age 55 if you have a 30-pack-year history of smoking and currently smoke or have quit within the past 15 years.  Colorectal cancer screening. All adults should have this screening starting at age 50 and continuing until age 75. You will have tests every 1-10 years, depending on your results and the type of screening  test. People at increased risk should start screening at an earlier age. Screening tests may include: ? Guaiac-based fecal occult blood testing. ? Fecal immunochemical test (FIT). ? Stool DNA test. ? Virtual colonoscopy. ? Sigmoidoscopy. During this test, a flexible tube with a tiny camera (sigmoidoscope) is used to examine your rectum and lower colon. The sigmoidoscope is inserted through your anus into your rectum and lower colon. ? Colonoscopy. During this test, a long, thin, flexible tube with a tiny camera (colonoscope) is used to examine your entire colon and rectum.  Prostate cancer screening. Recommendations will vary depending on your family history and other risks.  Hepatitis C blood test.  Hepatitis B blood test.  Sexually transmitted disease (STD) testing.  Diabetes screening. This is done by checking your blood sugar (glucose) after you have not eaten for a while (fasting). You may have this done every 1-3 years.  Abdominal aortic aneurysm (AAA) screening. You may need this if you are a current or former smoker.  Osteoporosis. You may be screened starting at age 70 if you are at high risk. Talk with your health care provider about your test results, treatment options, and if necessary, the need for more tests. Vaccines Your health care provider may recommend certain vaccines, such as:  Influenza vaccine. This is recommended every year.  Tetanus, diphtheria, and acellular pertussis (Tdap, Td) vaccine. You may need a Td booster every 10 years.  Varicella vaccine. You may need this if you have not been vaccinated.  Zoster vaccine. You may need this after age 60.  Measles, mumps, and rubella (MMR)   vaccine. You may need at least one dose of MMR if you were born in 1957 or later. You may also need a second dose.  Pneumococcal 13-valent conjugate (PCV13) vaccine. One dose is recommended after age 24.  Pneumococcal polysaccharide (PPSV23) vaccine. One dose is recommended  after age 30.  Meningococcal vaccine. You may need this if you have certain conditions.  Hepatitis A vaccine. You may need this if you have certain conditions or if you travel or work in places where you may be exposed to hepatitis A.  Hepatitis B vaccine. You may need this if you have certain conditions or if you travel or work in places where you may be exposed to hepatitis B.  Haemophilus influenzae type b (Hib) vaccine. You may need this if you have certain risk factors. Talk to your health care provider about which screenings and vaccines you need and how often you need them. This information is not intended to replace advice given to you by your health care provider. Make sure you discuss any questions you have with your health care provider. Document Released: 08/30/2015 Document Revised: 09/23/2017 Document Reviewed: 06/04/2015 Elsevier Interactive Patient Education  2019 [place] for Adults, Male A healthy lifestyle and preventive care can promote health and wellness. Preventive health guidelines for men include the following key practices:  A routine yearly physical is a good way to check with your health care provider about your health and preventative screening. It is a chance to share any concerns and updates on your health and to receive a thorough exam.  Visit your dentist for a routine exam and preventative care every 6 months. Brush your teeth twice a day and floss once a day. Good oral hygiene prevents tooth decay and gum disease.  The frequency of eye exams is based on your age, health, family medical history, use of contact lenses, and other factors. Follow your health care provider's recommendations for frequency of eye exams.  Eat a healthy diet. Foods such as vegetables, fruits, whole grains, low-fat dairy products, and lean protein foods contain the nutrients you need without too many calories. Decrease your intake of foods high in  solid fats, added sugars, and salt. Eat the right amount of calories for you. Get information about a proper diet from your health care provider, if necessary.  Regular physical exercise is one of the most important things you can do for your health. Most adults should get at least 150 minutes of moderate-intensity exercise (any activity that increases your heart rate and causes you to sweat) each week. In addition, most adults need muscle-strengthening exercises on 2 or more days a week.  Maintain a healthy weight. The body mass index (BMI) is a screening tool to identify possible weight problems. It provides an estimate of body fat based on height and weight. Your health care provider can find your BMI and can help you achieve or maintain a healthy weight. For adults 20 years and older:  A BMI below 18.5 is considered underweight.  A BMI of 18.5 to 24.9 is normal.  A BMI of 25 to 29.9 is considered overweight.  A BMI of 30 and above is considered obese.  Maintain normal blood lipids and cholesterol levels by exercising and minimizing your intake of saturated fat. Eat a balanced diet with plenty of fruit and vegetables. Blood tests for lipids and cholesterol should begin at age 68 and be repeated every 5 years. If  your lipid or cholesterol levels are high, you are over 50, or you are at high risk for heart disease, you may need your cholesterol levels checked more frequently. Ongoing high lipid and cholesterol levels should be treated with medicines if diet and exercise are not working.  If you smoke, find out from your health care provider how to quit. If you do not use tobacco, do not start.  Lung cancer screening is recommended for adults aged 31-80 years who are at high risk for developing lung cancer because of a history of smoking. A yearly low-dose CT scan of the lungs is recommended for people who have at least a 30-pack-year history of smoking and are a current smoker or have quit within  the past 15 years. A pack year of smoking is smoking an average of 1 pack of cigarettes a day for 1 year (for example: 1 pack a day for 30 years or 2 packs a day for 15 years). Yearly screening should continue until the smoker has stopped smoking for at least 15 years. Yearly screening should be stopped for people who develop a health problem that would prevent them from having lung cancer treatment.  If you choose to drink alcohol, do not have more than 2 drinks per day. One drink is considered to be 12 ounces (355 mL) of beer, 5 ounces (148 mL) of wine, or 1.5 ounces (44 mL) of liquor.  Avoid use of street drugs. Do not share needles with anyone. Ask for help if you need support or instructions about stopping the use of drugs.  High blood pressure causes heart disease and increases the risk of stroke. Your blood pressure should be checked at least every 1-2 years. Ongoing high blood pressure should be treated with medicines, if weight loss and exercise are not effective.  If you are 55-53 years old, ask your health care provider if you should take aspirin to prevent heart disease.  Diabetes screening is done by taking a blood sample to check your blood glucose level after you have not eaten for a certain period of time (fasting). If you are not overweight and you do not have risk factors for diabetes, you should be screened once every 3 years starting at age 75. If you are overweight or obese and you are 32-46 years of age, you should be screened for diabetes every year as part of your cardiovascular risk assessment.  Colorectal cancer can be detected and often prevented. Most routine colorectal cancer screening begins at the age of 64 and continues through age 61. However, your health care provider may recommend screening at an earlier age if you have risk factors for colon cancer. On a yearly basis, your health care provider may provide home test kits to check for hidden blood in the stool. Use of a  small camera at the end of a tube to directly examine the colon (sigmoidoscopy or colonoscopy) can detect the earliest forms of colorectal cancer. Talk to your health care provider about this at age 60, when routine screening begins. Direct exam of the colon should be repeated every 5-10 years through age 56, unless early forms of precancerous polyps or small growths are found.  People who are at an increased risk for hepatitis B should be screened for this virus. You are considered at high risk for hepatitis B if:  You were born in a country where hepatitis B occurs often. Talk with your health care provider about which countries are considered high  a high-risk country and you have not received a shot to protect against hepatitis B (hepatitis B vaccine).  You have HIV or AIDS.  You use needles to inject street drugs.  You live with, or have sex with, someone who has hepatitis B.  You are a man who has sex with other men (MSM).  You get hemodialysis treatment.  You take certain medicines for conditions such as cancer, organ transplantation, and autoimmune conditions.  Hepatitis C blood testing is recommended for all people born from 1945 through 1965 and any individual with known risks for hepatitis C.  Practice safe sex. Use condoms and avoid high-risk sexual practices to reduce the spread of sexually transmitted infections (STIs). STIs include gonorrhea, chlamydia, syphilis, trichomonas, herpes, HPV, and human immunodeficiency virus (HIV). Herpes, HIV, and HPV are viral illnesses that have no cure. They can result in disability, cancer, and death.  If you are a man who has sex with other men, you should be screened at least once per year for:  HIV.  Urethral, rectal, and pharyngeal infection of gonorrhea, chlamydia, or both.  If you are at risk of being infected with HIV, it is recommended that you take a prescription medicine daily to prevent HIV infection. This  is called preexposure prophylaxis (PrEP). You are considered at risk if:  You are a man who has sex with other men (MSM) and have other risk factors.  You are a heterosexual man, are sexually active, and are at increased risk for HIV infection.  You take drugs by injection.  You are sexually active with a partner who has HIV.  Talk with your health care provider about whether you are at high risk of being infected with HIV. If you choose to begin PrEP, you should first be tested for HIV. You should then be tested every 3 months for as long as you are taking PrEP.  A one-time screening for abdominal aortic aneurysm (AAA) and surgical repair of large AAAs by ultrasound are recommended for men ages 65 to 75 years who are current or former smokers.  Healthy men should no longer receive prostate-specific antigen (PSA) blood tests as part of routine cancer screening. Talk with your health care provider about prostate cancer screening.  Testicular cancer screening is not recommended for adult males who have no symptoms. Screening includes self-exam, a health care provider exam, and other screening tests. Consult with your health care provider about any symptoms you have or any concerns you have about testicular cancer.  Use sunscreen. Apply sunscreen liberally and repeatedly throughout the day. You should seek shade when your shadow is shorter than you. Protect yourself by wearing long sleeves, pants, a wide-brimmed hat, and sunglasses year round, whenever you are outdoors.  Once a month, do a whole-body skin exam, using a mirror to look at the skin on your back. Tell your health care provider about new moles, moles that have irregular borders, moles that are larger than a pencil eraser, or moles that have changed in shape or color.  Stay current with required vaccines (immunizations).  Influenza vaccine. All adults should be immunized every year.  Tetanus, diphtheria, and acellular pertussis (Td,  Tdap) vaccine. An adult who has not previously received Tdap or who does not know his vaccine status should receive 1 dose of Tdap. This initial dose should be followed by tetanus and diphtheria toxoids (Td) booster doses every 10 years. Adults with an unknown or incomplete history of completing a 3-dose immunization series with   Td-containing vaccines should begin or complete a primary immunization series including a Tdap dose. Adults should receive a Td booster every 10 years.  Varicella vaccine. An adult without evidence of immunity to varicella should receive 2 doses or a second dose if he has previously received 1 dose.  Human papillomavirus (HPV) vaccine. Males aged 11-21 years who have not received the vaccine previously should receive the 3-dose series. Males aged 22-26 years may be immunized. Immunization is recommended through the age of 26 years for any male who has sex with males and did not get any or all doses earlier. Immunization is recommended for any person with an immunocompromised condition through the age of 26 years if he did not get any or all doses earlier. During the 3-dose series, the second dose should be obtained 4-8 weeks after the first dose. The third dose should be obtained 24 weeks after the first dose and 16 weeks after the second dose.  Zoster vaccine. One dose is recommended for adults aged 60 years or older unless certain conditions are present.  Measles, mumps, and rubella (MMR) vaccine. Adults born before 1957 generally are considered immune to measles and mumps. Adults born in 1957 or later should have 1 or more doses of MMR vaccine unless there is a contraindication to the vaccine or there is laboratory evidence of immunity to each of the three diseases. A routine second dose of MMR vaccine should be obtained at least 28 days after the first dose for students attending postsecondary schools, health care workers, or international travelers. People who received  inactivated measles vaccine or an unknown type of measles vaccine during 1963-1967 should receive 2 doses of MMR vaccine. People who received inactivated mumps vaccine or an unknown type of mumps vaccine before 1979 and are at high risk for mumps infection should consider immunization with 2 doses of MMR vaccine. Unvaccinated health care workers born before 1957 who lack laboratory evidence of measles, mumps, or rubella immunity or laboratory confirmation of disease should consider measles and mumps immunization with 2 doses of MMR vaccine or rubella immunization with 1 dose of MMR vaccine.  Pneumococcal 13-valent conjugate (PCV13) vaccine. When indicated, a person who is uncertain of his immunization history and has no record of immunization should receive the PCV13 vaccine. All adults 65 years of age and older should receive this vaccine. An adult aged 19 years or older who has certain medical conditions and has not been previously immunized should receive 1 dose of PCV13 vaccine. This PCV13 should be followed with a dose of pneumococcal polysaccharide (PPSV23) vaccine. Adults who are at high risk for pneumococcal disease should obtain the PPSV23 vaccine at least 8 weeks after the dose of PCV13 vaccine. Adults older than 65 years of age who have normal immune system function should obtain the PPSV23 vaccine dose at least 1 year after the dose of PCV13 vaccine.  Pneumococcal polysaccharide (PPSV23) vaccine. When PCV13 is also indicated, PCV13 should be obtained first. All adults aged 65 years and older should be immunized. An adult younger than age 65 years who has certain medical conditions should be immunized. Any person who resides in a nursing home or long-term care facility should be immunized. An adult smoker should be immunized. People with an immunocompromised condition and certain other conditions should receive both PCV13 and PPSV23 vaccines. People with human immunodeficiency virus (HIV) infection  should be immunized as soon as possible after diagnosis. Immunization during chemotherapy or radiation therapy should be avoided. Routine   use of PPSV23 vaccine is not recommended for American Indians, [place] Natives, or people younger than 65 years unless there are medical conditions that require PPSV23 vaccine. When indicated, people who have unknown immunization and have no record of immunization should receive PPSV23 vaccine. One-time revaccination 5 years after the first dose of PPSV23 is recommended for people aged 19-64 years who have chronic kidney failure, nephrotic syndrome, asplenia, or immunocompromised conditions. People who received 1-2 doses of PPSV23 before age 65 years should receive another dose of PPSV23 vaccine at age 65 years or later if at least 5 years have passed since the previous dose. Doses of PPSV23 are not needed for people immunized with PPSV23 at or after age 65 years.  Meningococcal vaccine. Adults with asplenia or persistent complement component deficiencies should receive 2 doses of quadrivalent meningococcal conjugate (MenACWY-D) vaccine. The doses should be obtained at least 2 months apart. Microbiologists working with certain meningococcal bacteria, military recruits, people at risk during an outbreak, and people who travel to or live in countries with a high rate of meningitis should be immunized. A first-year college student up through age 21 years who is living in a residence hall should receive a dose if he did not receive a dose on or after his 16th birthday. Adults who have certain high-risk conditions should receive one or more doses of vaccine.  Hepatitis A vaccine. Adults who wish to be protected from this disease, have chronic liver disease, work with hepatitis A-infected animals, work in hepatitis A research labs, or travel to or work in countries with a high rate of hepatitis A should be immunized. Adults who were previously unvaccinated and who anticipate close  contact with an international adoptee during the first 60 days after arrival in the [place] from a country with a high rate of hepatitis A should be immunized.  Hepatitis B vaccine. Adults should be immunized if they wish to be protected from this disease, are under age 59 years and have diabetes, have chronic liver disease, have had more than one sex partner in the past 6 months, may be exposed to blood or other infectious body fluids, are household contacts or sex partners of hepatitis B positive people, are clients or workers in certain care facilities, or travel to or work in countries with a high rate of hepatitis B.  Haemophilus influenzae type b (Hib) vaccine. A previously unvaccinated person with asplenia or sickle cell disease or having a scheduled splenectomy should receive 1 dose of Hib vaccine. Regardless of previous immunization, a recipient of a hematopoietic stem cell transplant should receive a 3-dose series 6-12 months after his successful transplant. Hib vaccine is not recommended for adults with HIV infection. Preventive Service / Frequency Ages 19 to 39  Blood pressure check.** / Every 3-5 years.  Lipid and cholesterol check.** / Every 5 years beginning at age 20.  Hepatitis C blood test.** / For any individual with known risks for hepatitis C.  Skin self-exam. / Monthly.  Influenza vaccine. / Every year.  Tetanus, diphtheria, and acellular pertussis (Tdap, Td) vaccine.** / Consult your health care provider. 1 dose of Td every 10 years.  Varicella vaccine.** / Consult your health care provider.  HPV vaccine. / 3 doses over 6 months, if 26 or younger.  Measles, mumps, rubella (MMR) vaccine.** / You need at least 1 dose of MMR if you were born in 1957 or later. You may also need a second dose.  Pneumococcal 13-valent conjugate (PCV13) vaccine.** /   Pneumococcal 13-valent conjugate (PCV13) vaccine.** / Consult your health care provider.  Pneumococcal polysaccharide (PPSV23) vaccine.** / 1 to 2 doses  if you smoke cigarettes or if you have certain conditions.  Meningococcal vaccine.** / 1 dose if you are age 26 to 70 years and a Market researcher living in a residence hall, or have one of several medical conditions. You may also need additional booster doses.  Hepatitis A vaccine.** / Consult your health care provider.  Hepatitis B vaccine.** / Consult your health care provider.  Haemophilus influenzae type b (Hib) vaccine.** / Consult your health care provider. Ages 46 to 61  Blood pressure check.** / Every year.  Lipid and cholesterol check.** / Every 5 years beginning at age 52.  Lung cancer screening. / Every year if you are aged 43-80 years and have a 30-pack-year history of smoking and currently smoke or have quit within the past 15 years. Yearly screening is stopped once you have quit smoking for at least 15 years or develop a health problem that would prevent you from having lung cancer treatment.  Fecal occult blood test (FOBT) of stool. / Every year beginning at age 70 and continuing until age 42. You may not have to do this test if you get a colonoscopy every 10 years.  Flexible sigmoidoscopy** or colonoscopy.** / Every 5 years for a flexible sigmoidoscopy or every 10 years for a colonoscopy beginning at age 33 and continuing until age 73.  Hepatitis C blood test.** / For all people born from [birth year] through 1965 and any individual with known risks for hepatitis C.  Skin self-exam. / Monthly.  Influenza vaccine. / Every year.  Tetanus, diphtheria, and acellular pertussis (Tdap/Td) vaccine.** / Consult your health care provider. 1 dose of Td every 10 years.  Varicella vaccine.** / Consult your health care provider.  Zoster vaccine.** / 1 dose for adults aged 74 years or older.  Measles, mumps, rubella (MMR) vaccine.** / You need at least 1 dose of MMR if you were born in 1957 or later. You may also need a second dose.  Pneumococcal 13-valent conjugate (PCV13)  vaccine.** / Consult your health care provider.  Pneumococcal polysaccharide (PPSV23) vaccine.** / 1 to 2 doses if you smoke cigarettes or if you have certain conditions.  Meningococcal vaccine.** / Consult your health care provider.  Hepatitis A vaccine.** / Consult your health care provider.  Hepatitis B vaccine.** / Consult your health care provider.  Haemophilus influenzae type b (Hib) vaccine.** / Consult your health care provider. Ages 60 and over  Blood pressure check.** / Every year.  Lipid and cholesterol check.**/ Every 5 years beginning at age 23.  Lung cancer screening. / Every year if you are aged 2-80 years and have a 30-pack-year history of smoking and currently smoke or have quit within the past 15 years. Yearly screening is stopped once you have quit smoking for at least 15 years or develop a health problem that would prevent you from having lung cancer treatment.  Fecal occult blood test (FOBT) of stool. / Every year beginning at age 109 and continuing until age 52. You may not have to do this test if you get a colonoscopy every 10 years.  Flexible sigmoidoscopy** or colonoscopy.** / Every 5 years for a flexible sigmoidoscopy or every 10 years for a colonoscopy beginning at age 51 and continuing until age 75.  Hepatitis C blood test.** / For all people born from [birth year] through 1963 and any individual with known risks  aneurysm (AAA) screening.** / A one-time screening for ages 65 to 75 years who are current or former smokers.  Skin self-exam. / Monthly.  Influenza vaccine. / Every year.  Tetanus, diphtheria, and acellular pertussis (Tdap/Td) vaccine.** / 1 dose of Td every 10 years.  Varicella vaccine.** / Consult your health care provider.  Zoster vaccine.** / 1 dose for adults aged 60 years or older.  Pneumococcal 13-valent conjugate (PCV13) vaccine.** / 1 dose for all adults aged 65 years and older.  Pneumococcal polysaccharide (PPSV23)  vaccine.** / 1 dose for all adults aged 65 years and older.  Meningococcal vaccine.** / Consult your health care provider.  Hepatitis A vaccine.** / Consult your health care provider.  Hepatitis B vaccine.** / Consult your health care provider.  Haemophilus influenzae type b (Hib) vaccine.** / Consult your health care provider. **Family history and personal history of risk and conditions may change your health care provider's recommendations.   This information is not intended to replace advice given to you by your health care provider. Make sure you discuss any questions you have with your health care provider.   Document Released: 09/29/2001 Document Revised: 08/24/2014 Document Reviewed: 12/29/2010 Elsevier Interactive Patient Education 2016 Elsevier Inc. 

## 2019-06-16 NOTE — Progress Notes (Signed)
Subjective:   Joseph Collier is a 68 y.o. male who presents for Medicare Annual/Subsequent preventive examination.  I, Joseph Collier, am serving as a Education administrator for Dr. Mellody Collier.   HPI:  Regarding dental follow-up, sees Dr, Joseph Collier out on [address].   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 Doesn't think his symptoms are bad enough to be referred to an audiologist at this time.   Thinks his [facility]. Marland Kitchen   Patient believes his last diabetic eye exam was obtained in 2017, because he kept putting it off and putting it off, "and then COVID hit."   Smoked in the past, for several years, but quit in 1991 when his son was born. Thinks he smoked for about 20 years at 1ppd and only for 5 yrs two packs per day "around when I quit."   Has a 25 pack-year history.   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 Is not seeing any new providers.    PE:  General:  Well Developed, well nourished, appropriate for stated age.  Neuro:  Alert and oriented,  extra-ocular muscles intact  HEENT:  Normocephalic, atraumatic, neck supple, no carotid bruits appreciated  Skin:  no gross rash, warm, pink. Cardiac:  RRR, S1 S2 Respiratory:  ECTA B/L and A/P, Not using accessory muscles, speaking in full sentences- unlabored. Vascular:  Ext warm, no cyanosis apprec.; cap RF less 2 sec. Psych:  No HI/SI, judgement and insight good, Euthymic mood. Full Affect.   Review of Systems: General:   Denies fever, chills, unexplained weight loss.  Optho/Auditory:   Denies visual changes, blurred vision/LOV Respiratory:   Denies SOB, DOE more than  baseline levels.  Cardiovascular:   Denies chest pain, palpitations, new onset peripheral edema  Gastrointestinal:   Denies nausea, vomiting, diarrhea.  Genitourinary: Denies dysuria, freq/ urgency, flank pain or discharge from genitals.  Endocrine:     Denies hot or cold intolerance, polyuria, polydipsia. Musculoskeletal:   Denies unexplained myalgias, joint swelling, unexplained arthralgias, gait problems.  Skin:  Denies rash, suspicious lesions Neurological:     Denies dizziness, unexplained weakness, numbness  Psychiatric/Behavioral:   Denies mood changes, suicidal or homicidal ideations, hallucinations     Objective:    Vitals: BP 115/69 (BP Location: Left Arm, Patient Position: Sitting, Cuff Size: Large)   Pulse 99   Temp 97.7 F (36.5 C) (Oral)   Resp 12   Ht 5\' 9"  (1.753 m)   Wt 230 lb (104.3 kg)   SpO2 99%   BMI 33.97 kg/m   Body mass index is 33.97 kg/m.  Advanced Directives 01/20/2017 02/27/2016  Does Patient Have a Medical Advance Directive? No No  Would patient like information on creating a medical advance directive? - No - patient declined information    Tobacco Social History   Tobacco Use  Smoking Status Former Smoker  . Packs/day: 1.00  . Years: 25.00  . Pack years: 25.00  . Types: Cigarettes  . Quit date: 08/17/1989  . Years since quitting: 29.8  Smokeless Tobacco Never Used  Tobacco Comment   quit 1991  Counseling given: Not Answered Comment: quit 1991    Past Medical History:  Diagnosis Date  . Allergic rhinitis, cause unspecified   . Anemia, unspecified   . Arthritis   . Cancer ([place]) 08/17/2006   Melanoma  . Essential hypertension, benign   . Impotence of organic origin 04/14/2007  . Obesity, unspecified   . Other and unspecified hyperlipidemia   . Personal history of colonic polyps   . Skin lesion 04/14/2007   melanoma   Past Surgical History:  Procedure Laterality Date  . APPENDECTOMY    . arthroscopy of knee  2008   Left Knee   . COLONOSCOPY  08/18/2007   normal. Joseph Collier.  Marland Kitchen HERNIA REPAIR     age 22  . JOINT REPLACEMENT    . melanoma of forearm resection  1992   Left        Joseph Collier  . TONSILLECTOMY AND ADENOIDECTOMY     Family History  Problem Relation Age of Onset  . Coronary artery disease Father   . Hypertension Father   . Heart disease Father        AMI cause of death  . Diabetes Father   . Cancer Mother 58       lung  . Hypertension Brother   . Diabetes Brother    Social History   Socioeconomic History  . Marital status: Married    Spouse name: Joseph Collier  . Number of children: 1  . Years of education: 20  . Highest education level: Not on file  Occupational History  . Occupation: outside Press photographer    Comment: engineered woods since 2000    Employer: [employer]  Social Needs  . Financial resource strain: Not on file  . Food insecurity    Worry: Not on file    Inability: Not on file  . Transportation needs    Medical: Not on file    Non-medical: Not on file  Tobacco Use  . Smoking status: Former Smoker    Packs/day: 1.00    Years: 25.00    Pack years: 25.00    Types: Cigarettes    Quit date: 08/17/1989    Years since quitting: 29.8  . Smokeless tobacco: Never Used  . Tobacco comment: quit 1991  Substance and Sexual Activity  . Alcohol use: Yes    Alcohol/week: 5.0 standard drinks    Types: 5 Cans of beer per week    Comment: moderate: drinks hard liquor 7 drinks per month in 08/2011/4 drinks 4 a week  . Drug use: No  . Sexual activity: Yes    Birth control/protection: Post-menopausal  Lifestyle  . Physical activity    Days per week: Not on file    Minutes per session: Not on file  . Stress: Not on file  Relationships  . Social Herbalist on phone: Not on file    Gets together: Not on file    Attends religious service: Not on file    Active member of club or organization: Not on file    Attends meetings of clubs or organizations: Not on file    Relationship status:  Not on file  Other Topics Concern  . Not on file  Social History Narrative   Marital status:  Married x 25 years, happily, second marriage.       Children:  1 son (79 yo.)  Son in [place], [place].       Lives with wife.       Employment:  Sales; current position 2000; engineered wood sales; builds houses; stressful.      Tobacco: none       Alcohol:    3 drinks per night every other night.       Drugs: none       Exercise: Light, walking, 3 times a week.        Caffeine use: carbonated beverages; consumes a minimal amount.        Seatbelt:  Always uses seat belts, no texting.       Guns:  Guns in the home stored in locked cabinet.        Home Safety:  Smoke Alarms in the home.        Education: [employer].    Outpatient Encounter Medications as of 06/16/2019  Medication Sig  . aspirin EC 81 MG tablet Take 81 mg by mouth daily.  . Canagliflozin-metFORMIN HCl (INVOKAMET) [phone] MG TABS Take 1 tablet by mouth 2 (two) times daily.  . Dulaglutide (TRULICITY) 1.5 0000000 SOPN INJECT 1.5MG  SUBCUTANEOUSLY ONCE A WEEK  . fluticasone (FLONASE) 50 MCG/ACT nasal spray Place 2 sprays into both nostrils daily.  Marland Kitchen glucose blood test strip Use as instructed  . lisinopril (ZESTRIL) 40 MG tablet Take 1 tablet (40 mg total) by mouth daily.  Marland Kitchen omeprazole (PRILOSEC) 20 MG capsule Take 1 capsule (20 mg total) by mouth daily.  . sildenafil (REVATIO) 20 MG tablet 1-3 tabs prior to intercourse as needed  . simvastatin (ZOCOR) 20 MG tablet Take 1 tablet (20 mg total) by mouth at bedtime.   No facility-administered encounter medications on file as of 06/16/2019.     Activities of Daily Living In your present state of health, do you have any difficulty performing the following activities: 06/16/2019 03/14/2019  Hearing? Y N  Comment R ear feels like it has water in it all the time -  Vision? N N  Difficulty concentrating or making decisions? N N  Walking or climbing stairs? N N  Dressing or bathing? N N   Doing errands, shopping? N N  Some recent data might be hidden    Patient Care Team: Joseph Dance, DO as PCP - General (Family Medicine)   Assessment:   This is a routine wellness examination for [place].  Exercise Activities and Dietary recommendations    Goals   None     Fall Risk Fall Risk  06/16/2019 03/14/2019 05/09/2018 04/22/2017 01/20/2017  Falls in the past year? 0 0 No No No  Number falls in past yr: 0 - - - -  Injury with Fall? 0 - - - -  Follow up - Falls evaluation completed - - -   Is the patient's home free of loose throw rugs in walkways, pet beds, electrical cords, etc?   yes     Grab bars in the bathroom? no      Handrails on the stairs?yes      Adequate lighting?  yes Timed Get Up and Go Performed: WNL's  Depression Screen PHQ 2/9 Scores 06/16/2019 04/10/2019 03/14/2019 08/08/2018  PHQ - 2 Score 0 0 0 2  PHQ- 9 Score 1 0 0 4    Cognitive Function     6CIT Screen 06/16/2019  What Year? 0 points  What month? 0 points  What time? 0 points  Count back from 20 0 points  Months in reverse 0 points  Repeat phrase 2 points  Total Score 2    Immunization History  Administered Date(s) Administered  .  Fluad Quad(high Dose 65+) 05/26/2019  . Influenza Split 07/18/2007, 04/25/2012  . Influenza, High Dose Seasonal PF 08/08/2018  . Influenza,inj,Quad PF,6+ Mos 05/01/2013, 05/09/2014, 05/24/2015, 06/18/2017  . Pneumococcal Conjugate-13 06/16/2019  . Pneumococcal Polysaccharide-23 05/24/2015  . Pneumococcal-Unspecified 08/17/2006  . Tdap 09/15/2011, 01/09/2019  . Zoster Recombinat (Shingrix) 10/07/2018, 02/14/2019    Qualifies for Shingles Vaccine? Vaccine is up to date  Screening Tests Health Maintenance  Topic Date Due  . OPHTHALMOLOGY EXAM  05/05/2017  . PNA vac Low Risk Adult (1 of 2 - PCV13) 01/28/2018  . COLONOSCOPY  03/13/2020 (Originally 01/24/2018)  . Hepatitis C Screening  04/22/2029 (Originally [birth year])  . FOOT EXAM  08/09/2019  .  HEMOGLOBIN A1C  09/14/2019  . TETANUS/TDAP  01/08/2029  . INFLUENZA VACCINE  Completed   Cancer Screenings: Lung: Low Dose CT Chest recommended if Age 8-80 years, 30 pack-year currently smoking OR have quit w/in 15years. Patient does not qualify. Colorectal: GI referral ordered  Additional Screenings: Hepatitis C Screening: Lab work ordered HIV Screening: Lab work ordered      Plan:    PLAN:  - Discussed that if patient's ringing in the ears worsens, he may be sent on referral to audiology for more evaluation.  - Advised patient to incorporate Flonase into his daily treatment plan.  - Per patient, last colonoscopy possibly obtained through [facility] in [facility].  - Records will be re-checked and obtained PRN. Colonoscopy will be ordered PRN.  - Importance of appropriate specialty follow-up discussed at length with patient today.  - TDAP, shingles, UTD. Vaccines recommended and ordered PRN.  - Influenza vaccination previously obtained.  - Due for urine microalbumin, A1c. Labs ordered  - Last diabetic eye exam obtained in 2017. Need for follow-up.  - Per pt, former smoker; need for AAA ultrasound. See orders.  - Discussed need for low-risk HIV/Hep C screen. See orders.  - Discussed need for PSA screening today. See orders.  - Encouraged patient to schedule an appointment to obtain prostate exam if desired he will remind me in future o/w PSA to be obtained today.  - Extensive education provided regarding need for all labs, screenings, and immunizations, and all questions answered.   Orders Placed This Encounter  Procedures  . Pneumococcal conjugate vaccine 13-valent IM  . CBC with Differential/Platelet  . Comprehensive metabolic panel    Order Specific Question:   Has the patient fasted?    Answer:   No  . Hemoglobin A1c  . Lipid panel    Order Specific Question:   Has the patient fasted?    Answer:   No  . T4, free  . VITAMIN D 25 Hydroxy (Vit-D Deficiency, Fractures)  . T3  .  Hepatitis C Antibody  . PSA  . Ambulatory referral to Gastroenterology    Referral Priority:   Routine    Referral Type:   Consultation    Referral Reason:   Specialty Services Required    Number of Visits Requested:   1  . Ambulatory referral to Ophthalmology    Referral Priority:   Routine    Referral Type:   Consultation    Referral Reason:   Specialty Services Required    Requested Specialty:   Ophthalmology    Number of Visits Requested:   1  . POCT UA - Microalbumin     Lab Orders     CBC with Differential/Platelet     Comprehensive metabolic panel     Hemoglobin A1c     Lipid  panel     T4, free     VITAMIN D 25 Hydroxy (Vit-D Deficiency, Fractures)     T3     Hepatitis C Antibody     PSA     POCT UA - Microalbumin  I have personally reviewed and noted the following in the patient's chart:   . Medical and social history . Use of alcohol, tobacco or illicit drugs  . Current medications and supplements . Functional ability and status . Nutritional status . Physical activity . Advanced directives . List of other physicians . Hospitalizations, surgeries, and ER visits in previous 12 months . Vitals . Screenings to include cognitive, depression, and falls . Referrals and appointments  In addition, I have reviewed and discussed with patient certain preventive protocols, quality metrics, and best practice recommendations. A written personalized care plan for preventive services as well as general preventive health recommendations were provided to patient.    Diego Cory Dawsen Krieger, D.O.

## 2019-06-20 LAB — CBC WITH DIFFERENTIAL/PLATELET
Basophils Absolute: 0.1 10*3/uL (ref 0.0–0.2)
Basos: 1 %
EOS (ABSOLUTE): 0.2 10*3/uL (ref 0.0–0.4)
Eos: 2 %
Hematocrit: 44.6 % (ref 37.5–51.0)
Hemoglobin: 15.5 g/dL (ref 13.0–17.7)
Immature Grans (Abs): 0 10*3/uL (ref 0.0–0.1)
Immature Granulocytes: 0 %
Lymphocytes Absolute: 2.8 10*3/uL (ref 0.7–3.1)
Lymphs: 28 %
MCH: 30.3 pg (ref 26.6–33.0)
MCHC: 34.8 g/dL (ref 31.5–35.7)
MCV: 87 fL (ref 79–97)
Monocytes Absolute: 1 10*3/uL — ABNORMAL HIGH (ref 0.1–0.9)
Monocytes: 10 %
Neutrophils Absolute: 6 10*3/uL (ref 1.4–7.0)
Neutrophils: 59 %
Platelets: 388 10*3/uL (ref 150–450)
RBC: 5.11 x10E6/uL (ref 4.14–5.80)
RDW: 11.9 % (ref 11.6–15.4)
WBC: 10.1 10*3/uL (ref 3.4–10.8)

## 2019-06-20 LAB — COMPREHENSIVE METABOLIC PANEL
ALT: 24 IU/L (ref 0–44)
AST: 17 IU/L (ref 0–40)
Albumin/Globulin Ratio: 2 (ref 1.2–2.2)
Albumin: 5 g/dL — ABNORMAL HIGH (ref 3.8–4.8)
Alkaline Phosphatase: 48 IU/L (ref 39–117)
BUN/Creatinine Ratio: 20 (ref 10–24)
BUN: 21 mg/dL (ref 8–27)
Bilirubin Total: 0.7 mg/dL (ref 0.0–1.2)
CO2: 23 mmol/L (ref 20–29)
Calcium: 10.7 mg/dL — ABNORMAL HIGH (ref 8.6–10.2)
Chloride: 96 mmol/L (ref 96–106)
Creatinine, Ser: 1.06 mg/dL (ref 0.76–1.27)
GFR calc Af Amer: 84 mL/min/{1.73_m2} (ref >59)
GFR calc non Af Amer: 73 mL/min/{1.73_m2} (ref >59)
Globulin, Total: 2.5 g/dL (ref 1.5–4.5)
Glucose: 185 mg/dL — ABNORMAL HIGH (ref 65–99)
Potassium: 5.6 mmol/L — ABNORMAL HIGH (ref 3.5–5.2)
Sodium: 136 mmol/L (ref 134–144)
Total Protein: 7.5 g/dL (ref 6.0–8.5)

## 2019-06-20 LAB — HEMOGLOBIN A1C
Est. average glucose Bld gHb Est-mCnc: 166 mg/dL
Hgb A1c MFr Bld: 7.4 % — ABNORMAL HIGH (ref 4.8–5.6)

## 2019-06-20 LAB — LIPID PANEL
Chol/HDL Ratio: 3 ratio (ref 0.0–5.0)
Cholesterol, Total: 120 mg/dL (ref 100–199)
HDL: 40 mg/dL (ref >39)
LDL Chol Calc (NIH): 62 mg/dL (ref 0–99)
Triglycerides: 91 mg/dL (ref 0–149)
VLDL Cholesterol Cal: 18 mg/dL (ref 5–40)

## 2019-06-20 LAB — T4, FREE: Free T4: 1.39 ng/dL (ref 0.82–1.77)

## 2019-06-20 LAB — VITAMIN D 25 HYDROXY (VIT D DEFICIENCY, FRACTURES): Vit D, 25-Hydroxy: 49.5 ng/mL (ref 30.0–100.0)

## 2019-06-27 ENCOUNTER — Ambulatory Visit (HOSPITAL_COMMUNITY)
Admission: RE | Admit: 2019-06-27 | Discharge: 2019-06-27 | Disposition: A | Discharge status: Home / Self Care | Payer: Medicare Other | Source: Ambulatory Visit | Attending: Cardiology | Admitting: Cardiology

## 2019-06-27 ENCOUNTER — Other Ambulatory Visit: Payer: Self-pay

## 2019-06-27 DIAGNOSIS — Z87891 Personal history of nicotine dependence: Secondary | ICD-10-CM | POA: Diagnosis not present

## 2019-06-27 DIAGNOSIS — Z136 Encounter for screening for cardiovascular disorders: Secondary | ICD-10-CM | POA: Insufficient documentation

## 2019-06-27 DIAGNOSIS — Z0000 Encounter for general adult medical examination without abnormal findings: Secondary | ICD-10-CM | POA: Diagnosis not present

## 2019-06-27 DIAGNOSIS — I700 Atherosclerosis of aorta: Secondary | ICD-10-CM | POA: Diagnosis not present

## 2019-08-29 ENCOUNTER — Encounter: Payer: Self-pay | Admitting: Family Medicine

## 2019-09-15 ENCOUNTER — Ambulatory Visit (INDEPENDENT_AMBULATORY_CARE_PROVIDER_SITE_OTHER): Payer: Medicare Other | Admitting: Family Medicine

## 2019-09-15 ENCOUNTER — Other Ambulatory Visit: Payer: Self-pay

## 2019-09-15 ENCOUNTER — Encounter: Payer: Self-pay | Admitting: Family Medicine

## 2019-09-15 VITALS — BP 121/71 | HR 80 | Temp 97.3°F | Ht 69.0 in | Wt 227.0 lb

## 2019-09-15 DIAGNOSIS — E1169 Type 2 diabetes mellitus with other specified complication: Secondary | ICD-10-CM | POA: Diagnosis not present

## 2019-09-15 DIAGNOSIS — Z79899 Other long term (current) drug therapy: Secondary | ICD-10-CM | POA: Insufficient documentation

## 2019-09-15 DIAGNOSIS — I152 Hypertension secondary to endocrine disorders: Secondary | ICD-10-CM

## 2019-09-15 DIAGNOSIS — E785 Hyperlipidemia, unspecified: Secondary | ICD-10-CM | POA: Diagnosis not present

## 2019-09-15 DIAGNOSIS — R748 Abnormal levels of other serum enzymes: Secondary | ICD-10-CM | POA: Diagnosis not present

## 2019-09-15 DIAGNOSIS — E1159 Type 2 diabetes mellitus with other circulatory complications: Secondary | ICD-10-CM

## 2019-09-15 DIAGNOSIS — I10 Essential (primary) hypertension: Secondary | ICD-10-CM | POA: Diagnosis not present

## 2019-09-15 LAB — HEMOGLOBIN A1C: A1c: 8.2

## 2019-09-15 MED ORDER — LISINOPRIL 40 MG PO TABS: 40.0000 mg | ORAL_TABLET | Freq: Every day | ORAL | 1 refills | Status: DC

## 2019-09-15 MED ORDER — SIMVASTATIN 20 MG PO TABS: 20.0000 mg | ORAL_TABLET | Freq: Every day | ORAL | 1 refills | Status: DC

## 2019-09-15 MED ORDER — INVOKAMET 150-1000 MG PO TABS: 1.0000 | ORAL_TABLET | Freq: Two times a day (BID) | ORAL | 1 refills | Status: DC

## 2019-09-15 MED ORDER — TRULICITY 1.5 MG/0.5ML ~~LOC~~ SOAJ: SUBCUTANEOUS | 1 refills | Status: DC

## 2019-09-15 MED ORDER — GLIPIZIDE 10 MG PO TABS: 10.0000 mg | ORAL_TABLET | Freq: Two times a day (BID) | ORAL | 1 refills | Status: DC

## 2019-09-15 NOTE — Progress Notes (Signed)
Telehealth office visit note for Joseph Collier, Joseph Collier- at Primary Care at [facility]   I connected with current patient today and verified that I am speaking with the correct person using two identifiers.    Location of the patient: Home  Location of the provider: Office Only the patient (+/- their family members at pt's discretion) and myself were participating in the encounter - This visit type was conducted due to national recommendations for restrictions regarding the COVID-19 Pandemic (e.g. social distancing) in an effort to limit this patient's exposure and mitigate transmission in our community.  This format is felt to be most appropriate for this patient at this time.   - No physical exam could be performed with this format, beyond that communicated to [place] by the patient/ family members as noted.   - Additionally my office staff/ schedulers discussed with the patient that there may be a monetary charge related to this service, depending on their medical insurance.   The patient expressed understanding, and agreed to proceed.       History of Present Illness: No chief complaint on file.   Joseph Collier, am serving as scribe for Dr. Mellody Collier.  Patient notes "I think I'm doing okay."  Per patient, has never had pancreatitis; "not that I know of."  HPI:   Diabetes Mellitus:  Home glucose readings:  Notes his fasting sugars are running anywhere from 140-200.  In terms of lifestyle changes and healthy diet, "I know exactly what I'm supposed to do, but from November through December and into January, I've been kinda backsliding a little."   Notes only had low sugars in the past if he didn't eat or went out and did yard work, working hard.  - Patient reports good compliance with therapy plan: medication and/or lifestyle modification  - His denies acute concerns or problems related to treatment plan  - He denies new concerns.  Denies polyuria/polydipsia,  hypo/ hyperglycemia symptoms.  Denies new onset of: chest pain, exercise intolerance, shortness of breath, dizziness, visual changes, headache, lower extremity swelling or claudication.   Last A1C in the office was:  Lab Results  Component Value Date   HGBA1C 7.4 (H) 06/16/2019   HGBA1C 9.1 (A) 03/14/2019   HGBA1C 7.3 (A) 08/08/2018   Lab Results  Component Value Date   MICROALBUR 10 06/16/2019   LDLCALC 62 06/16/2019   CREATININE 1.06 06/16/2019   BP Readings from Last 3 Encounters:  09/15/19 121/71  06/16/19 115/69  04/10/19 115/68   Wt Readings from Last 3 Encounters:  09/15/19 227 lb (103 kg)  06/16/19 230 lb (104.3 kg)  04/10/19 229 lb 3.2 oz (104 kg)   HPI:  Hypertension:  -  His blood pressure at home has been running: 121/71 with a heart rate of 80.  Notes this was a good average value for him in terms of blood pressure.  Says sometimes it runs lower in the 120's, 118, 117, but "consistently the high 60's to 70's."  - Patient reports good compliance with medication and/or lifestyle modification  - His denies acute concerns or problems related to treatment plan  - He denies new onset of: chest pain, exercise intolerance, shortness of breath, dizziness, visual changes, headache, lower extremity swelling or claudication.   Last 3 blood pressure readings in our office are as follows: BP Readings from Last 3 Encounters:  09/15/19 121/71  06/16/19 115/69  04/10/19 115/68   Filed Weights   09/15/19  0848  Weight: 227 lb (103 kg)    HPI:  Hyperlipidemia:  69 y.o. male here for cholesterol follow-up.   - Patient reports good compliance with treatment plan of:  medication and/ or lifestyle management.    - Patient denies any acute concerns or problems with management plan.  Continues simvastatin nightly before bed.  - He denies new onset of: myalgias, arthralgias, increased fatigue more than normal, chest pains, exercise intolerance, shortness of breath,  dizziness, visual changes, headache, lower extremity swelling or claudication.   Most recent cholesterol panel was:  Lab Results  Component Value Date   CHOL 120 06/16/2019   HDL 40 06/16/2019   LDLCALC 62 06/16/2019   TRIG 91 06/16/2019   CHOLHDL 3.0 06/16/2019   Hepatic Function Latest Ref Rng & Units 06/16/2019 05/09/2018 02/03/2018  Total Protein 6.0 - 8.5 g/dL 7.5 6.9 7.0  Albumin 3.8 - 4.8 g/dL 5.0(H) 4.7 4.8  AST 0 - 40 IU/L _0 ALT 0 - 44 IU/L _1 Alk Phosphatase 39 - 117 IU/L 48 60 45  Total Bilirubin 0.0 - 1.2 mg/dL 0.7 0.5 0.5     GAD 7 : Generalized Anxiety Score 03/14/2019  Nervous, Anxious, on Edge 0  Control/stop worrying 0  Worry too much - different things 0  Trouble relaxing 0  Restless 0  Easily annoyed or irritable 0  Afraid - awful might happen 0  Total GAD 7 Score 0  Anxiety Difficulty Not difficult at all    Depression screen [facility] 2/9 09/15/2019 06/16/2019 04/10/2019 03/14/2019 08/08/2018  Decreased Interest 0 0 0 0 1  Down, Depressed, Hopeless 0 0 0 0 1  PHQ - 2 Score 0 0 0 0 2  Altered sleeping 0 0 0 0 0  Tired, decreased energy 1 1 0 0 1  Change in appetite 1 0 0 0 1  Feeling bad or failure about yourself  0 0 0 0 0  Trouble concentrating 0 0 0 0 0  Moving slowly or fidgety/restless 0 0 0 0 0  Suicidal thoughts 0 0 0 0 0  PHQ-9 Score 2 1 0 0 4  Difficult doing work/chores Not difficult at all Not difficult at all Not difficult at all Not difficult at all Not difficult at all  Some recent data might be hidden      Impression and Recommendations:    1. Hyperlipidemia associated with type 2 diabetes mellitus ([place])   2. Type 2 diabetes mellitus with other specified complication, unspecified whether long term insulin use ([place])   3. Hypertension associated with diabetes ([place])   4. Morbid obesity (H[facility])   5. Elevated lipase-  intol to DPP4-inh   6. High risk medications (not anticoagulants) long-term use       - Last seen 06/16/2019  for Medicare Wellness. - Last lab work drawn 06/16/2019.   Type 2 Diabetes Mellitus - Intolerance to DPP4 Inhibitors - A1c near goal at 7.4 three months ago, down from 9.1 prior. - Per patient, measured his A1c this morning at 8.2, worsened from prior.  - Discussed ideal goal A1c of under 7.0.  - Given increase in A1c, reviewed need for modifications to treatment plan today. - Risks, benefits, and alternatives to additional medications reviewed. - Patient does not prefer to begin insulin.  - Patient was managed on DPP4 inhibitor in past which caused the patient's lipase to rise.  - In addition to current management, discussed resuming use of glipizide, and  extensively reviewed that hypoglycemia can occur if the patient does not eat for long periods.  - If modifications to treatment plan do not improve patient's A1c, will begin insulin in future. - Lengthy conversation held and education provided.  All questions answered.  - Counseled patient on pathophysiology of disease and discussed various treatment options, which always includes dietary and lifestyle modification as first line.    - Importance of low carb, heart-healthy diet discussed with patient in addition to regular aerobic exercise of 26mn 5d/week or more.   - Check FBS and 2 hours after the biggest meal of your day.  Keep log and bring in next OV for my review.     - Also told patient if you ever feel poorly, please check your blood pressure and blood sugar, as one or the other could be the cause of your symptoms.  - Pt reminded about need for yearly eye and foot exams.  Told patient to make appt.for diabetic eye exam, CMAs here will do foot exams  - We will continue to monitor. - Patient will call if he desires further support.   Kidney Function in Diabetic Patient - Serum creatinine stable at 1.06 last check.   - Reviewed goal value of under 1.00. - need to improve BS's!  - Urine micro was <30 and UTD - Will  continue to monitor and re-check as discussed.   Hyperlipidemia associated with DM, Pure Hypercholesterolemia - Cholesterol levels stable, at goal last check. - Pt will continue current treatment regimen.  See med list.  - Prudent dietary changes such as low saturated & trans fat diets for hyperlipidemia and low carb diets for hypertriglyceridemia discussed with patient.    Encouraged patient to follow AHA guidelines for regular exercise and also engage in weight loss if BMI above 25.   We will continue to monitor and re-check as discussed.   Hypertension associated with DM - Blood pressure currently is stable, at goal. - Patient will continue current treatment regimen  - Counseled patient on pathophysiology of disease and discussed various treatment options, which always includes dietary and lifestyle modification as first line.   - Lifestyle changes such as dash and heart healthy diets and engaging in a regular exercise program discussed extensively with patient.   - Ambulatory blood pressure monitoring encouraged at least 3 times weekly.  Keep log and bring in every office visit.  Reminded patient that if they ever feel poorly in any way, to check their blood pressure and pulse.  - We will continue to monitor   BMI Counseling - Body mass index is 33.52 kg/m Explained to patient what BMI refers to, and what it means medically.    Told patient to think about it as a "medical risk stratification measurement" and how increasing BMI is associated with increasing risk/ or worsening state of various diseases such as hypertension, hyperlipidemia, diabetes, premature OA, depression etc.  American Heart Association guidelines for healthy diet, basically Mediterranean diet, and exercise guidelines of 30 minutes 5 days per week or more discussed in detail.  Health counseling performed.  All questions answered.  Lifestyle & Preventative Health Maintenance - Advised patient to continue  working toward exercising and prudent weight loss to improve overall mental, physical, and emotional health.    - Reviewed the "spokes of the wheel" of mood and health management.  Stressed the importance of ongoing prudent habits, including regular exercise, appropriate sleep hygiene, healthful dietary habits, and prayer/meditation to relax.  - Encouraged  patient to engage in daily physical activity as tolerated, especially a formal exercise routine.  Recommended that the patient eventually strive for at least 150 minutes of moderate cardiovascular activity per week according to guidelines established by the [facility].   - Healthy dietary habits encouraged, including low-carb, and high amounts of lean protein in diet.   - Patient should also consume adequate amounts of water.   - As part of my medical decision making, I reviewed the following data within the [place] History obtained from pt /family, CMA notes reviewed and incorporated if applicable, Labs reviewed, Radiograph/ tests reviewed if applicable and OV notes from prior OV's with me, as well as other specialists she/he has seen since seeing me last, were all reviewed and used in my medical decision making process today.    - Additionally, discussion had with patient regarding our treatment plan, and their biases/concerns about that plan were used in my medical decision making today.    - The patient agreed with the plan and demonstrated an understanding of the instructions.   No barriers to understanding were identified.    - Red flag symptoms and signs discussed in detail.  Patient expressed understanding regarding what to do in case of emergency\ urgent symptoms.   - The patient was advised to call back or seek an in-person evaluation if the symptoms worsen or if the condition fails to improve as anticipated.   Return for lab work in April including A1c, CMP, Magnesium with OV 3-5 days later.    Orders Placed This  Encounter  Procedures   Hemoglobin A1c   Comprehensive metabolic panel   Magnesium    Meds ordered this encounter  Medications   Canagliflozin-metFORMIN HCl (INVOKAMET) [phone] MG TABS    Sig: Take 1 tablet by mouth 2 (two) times daily.    Dispense:  180 tablet    Refill:  1   Dulaglutide (TRULICITY) 1.5 RX/5.4MG SOPN    Sig: INJECT 1.5MG SUBCUTANEOUSLY ONCE A WEEK    Dispense:  12 pen    Refill:  1   lisinopril (ZESTRIL) 40 MG tablet    Sig: Take 1 tablet (40 mg total) by mouth daily.    Dispense:  90 tablet    Refill:  1   simvastatin (ZOCOR) 20 MG tablet    Sig: Take 1 tablet (20 mg total) by mouth at bedtime.    Dispense:  90 tablet    Refill:  1   glipiZIDE (GLUCOTROL) 10 MG tablet    Sig: Take 1 tablet (10 mg total) by mouth 2 (two) times daily before a meal.    Dispense:  180 tablet    Refill:  1    Medications Discontinued During This Encounter  Medication Reason   simvastatin (ZOCOR) 20 MG tablet Reorder   Canagliflozin-metFORMIN HCl (INVOKAMET) [phone] MG TABS Reorder   Dulaglutide (TRULICITY) 1.5 QQ/7.6PP SOPN Reorder   lisinopril (ZESTRIL) 40 MG tablet Reorder      I provided 20+ minutes of non face-to-face time during this encounter.  Additional time was spent with charting and coordination of care before and after the actual visit commenced.   Note:  This note was prepared with assistance of Dragon voice recognition software. Occasional wrong-word or sound-a-like substitutions may have occurred due to the inherent limitations of voice recognition software.  This document serves as a record of services personally performed by Joseph Dance, DO. It was created on her behalf by Toni Amend, a trained medical  scribe. The creation of this record is based on the scribe's personal observations and the provider's statements to them.   This case required medical decision making of at least moderate complexity. The above documentation has been  reviewed to be accurate and was completed by [name], Joseph Collier.   Patient Care Team    Relationship Specialty Notifications Start End  Joseph Dance, DO PCP - General Family Medicine  02/27/16   Gastroenterology, [facility]    09/06/19    Allergies  Allergen Reactions   Penicillins Hives and Swelling   Saxagliptin    Tetracyclines & Related Hives and Swelling    -Vitals obtained; medications/ allergies reconciled;  personal medical, social, Sx etc.histories were updated by CMA, reviewed by me and are reflected in chart   Patient Active Problem List   Diagnosis Date Noted   Family history of cardiovascular disease 05/24/2015   Type 2 diabetes mellitus with other specified complication ([place]) 29/56/2130   Hyperlipidemia associated with type 2 diabetes mellitus ([place]) 09/05/2012   Hypertension associated with diabetes ([place]) 09/05/2012   Morbid obesity ([place]) 03/07/2016   Physically inactive 12/23/2016   History of tobacco abuse- less than 30 pack year history quit 1991 03/07/2016   High risk medications (not anticoagulants) long-term use 09/15/2019   Health education/counseling 10/18/2018   Elevated lipase 02/08/2018   Personal history of colonic polyps 03/07/2016   Allergic rhinitis 03/07/2016   History of colonic polyps 05/24/2015   Erectile dysfunction 05/10/2014   History of melanoma 02/05/2014   GERD (gastroesophageal reflux disease) 02/05/2014   Degenerative disc disease, cervical 10/30/2013   History of surgical procedure 10/07/2011     Current Meds  Medication Sig   aspirin EC 81 MG tablet Take 81 mg by mouth daily.   Canagliflozin-metFORMIN HCl (INVOKAMET) [phone] MG TABS Take 1 tablet by mouth 2 (two) times daily.   Dulaglutide (TRULICITY) 1.5 QM/5.7QI SOPN INJECT 1.5MG SUBCUTANEOUSLY ONCE A WEEK   fluticasone (FLONASE) 50 MCG/ACT nasal spray Place 2 sprays into both nostrils daily.   lisinopril (ZESTRIL) 40 MG tablet Take 1 tablet (40  mg total) by mouth daily.   omeprazole (PRILOSEC) 20 MG capsule Take 1 capsule (20 mg total) by mouth daily.   sildenafil (REVATIO) 20 MG tablet 1-3 tabs prior to intercourse as needed   simvastatin (ZOCOR) 20 MG tablet Take 1 tablet (20 mg total) by mouth at bedtime.   [DISCONTINUED] Canagliflozin-metFORMIN HCl (INVOKAMET) [phone] MG TABS Take 1 tablet by mouth 2 (two) times daily.   [DISCONTINUED] Dulaglutide (TRULICITY) 1.5 ON/6.2XB SOPN INJECT 1.5MG SUBCUTANEOUSLY ONCE A WEEK   [DISCONTINUED] lisinopril (ZESTRIL) 40 MG tablet Take 1 tablet (40 mg total) by mouth daily.   [DISCONTINUED] simvastatin (ZOCOR) 20 MG tablet Take 1 tablet (20 mg total) by mouth at bedtime.     Allergies:  Allergies  Allergen Reactions   Penicillins Hives and Swelling   Saxagliptin    Tetracyclines & Related Hives and Swelling     ROS:  See above HPI for pertinent positives and negatives   Objective:   Blood pressure 121/71, pulse 80, temperature (!) 97.3 F (36.3 C), temperature source Oral, height _0  (1.753 m), weight 227 lb (103 kg).  (if some vitals are omitted, this means that patient was UNABLE to obtain them even though they were asked to get them prior to OV today.  They were asked to call us at their earliest convenience with these once obtained. )  General: A & O * 3;  sounds in no acute distress; in usual state of health.  Skin: Pt confirms warm and dry extremities and pink fingertips HEENT: Pt confirms lips non-cyanotic Chest: Patient confirms normal chest excursion and movement Respiratory: speaking in full sentences, no conversational dyspnea; patient confirms no use of accessory muscles Psych: insight appears good, mood- appears full

## 2019-09-29 ENCOUNTER — Other Ambulatory Visit: Payer: Self-pay | Admitting: Family Medicine

## 2019-09-29 DIAGNOSIS — N529 Male erectile dysfunction, unspecified: Secondary | ICD-10-CM

## 2019-09-30 ENCOUNTER — Ambulatory Visit: Payer: Medicare Other

## 2019-10-01 ENCOUNTER — Ambulatory Visit: Payer: Medicare Other | Attending: Internal Medicine

## 2019-10-01 DIAGNOSIS — Z23 Encounter for immunization: Secondary | ICD-10-CM

## 2019-10-01 NOTE — Progress Notes (Signed)
   Covid-19 Vaccination Clinic  Name:  Joseph Collier    MRN: [id number] DOB: 10-Jan-1951  10/01/2019  [name] was observed post Covid-19 immunization for 15 minutes without incidence. He was provided with Vaccine Information Sheet and instruction to access the V-Safe system.   [name] was instructed to call 911 with any severe reactions post vaccine: Marland Kitchen Difficulty breathing  . Swelling of your face and throat  . A fast heartbeat  . A bad rash all over your body  . Dizziness and weakness    Immunizations Administered    Name Date Dose VIS Date Route   Pfizer COVID-19 Vaccine 10/01/2019  2:49 PM 0.3 mL 07/28/2019 Intramuscular   Manufacturer: [place]   Lot: [id number]   [place]: [id number]

## 2019-10-24 ENCOUNTER — Ambulatory Visit: Payer: Medicare Other | Attending: Internal Medicine

## 2019-10-24 DIAGNOSIS — Z23 Encounter for immunization: Secondary | ICD-10-CM

## 2019-10-24 NOTE — Progress Notes (Signed)
   Covid-19 Vaccination Clinic  Name:  Joseph Collier    MRN: [id number] DOB: 1950-08-19  10/24/2019  [name] was observed post Covid-19 immunization for 15 minutes without incident. He was provided with Vaccine Information Sheet and instruction to access the V-Safe system.   [name] was instructed to call 911 with any severe reactions post vaccine: Marland Kitchen Difficulty breathing  . Swelling of face and throat  . A fast heartbeat  . A bad rash all over body  . Dizziness and weakness   Immunizations Administered    Name Date Dose VIS Date Route   Pfizer COVID-19 Vaccine 10/24/2019  3:32 PM 0.3 mL 07/28/2019 Intramuscular   Manufacturer: [place]   Lot: [id number]   [place]: [id number]

## 2019-10-25 ENCOUNTER — Ambulatory Visit: Payer: Medicare Other

## 2019-10-30 ENCOUNTER — Other Ambulatory Visit: Payer: Self-pay | Admitting: Family Medicine

## 2019-10-30 DIAGNOSIS — K219 Gastro-esophageal reflux disease without esophagitis: Secondary | ICD-10-CM

## 2019-11-07 ENCOUNTER — Encounter: Payer: Self-pay | Admitting: Family Medicine

## 2019-11-07 ENCOUNTER — Other Ambulatory Visit: Payer: Self-pay | Admitting: Family Medicine

## 2019-11-08 MED ORDER — CANAGLIFLOZIN 300 MG PO TABS: 300.0000 mg | ORAL_TABLET | Freq: Every day | ORAL | 0 refills | Status: DC

## 2019-11-08 MED ORDER — METFORMIN HCL 1000 MG PO TABS: ORAL_TABLET | ORAL | 0 refills | Status: DC

## 2019-11-08 NOTE — Telephone Encounter (Signed)
Spoke with patient and he agreed to change in therapy. Medication sent to pharmacy per Dr. Raliegh Scarlet. AS, CMA

## 2019-11-27 ENCOUNTER — Telehealth: Payer: Self-pay | Admitting: Family Medicine

## 2019-11-27 ENCOUNTER — Other Ambulatory Visit: Payer: Self-pay | Admitting: Family Medicine

## 2019-11-27 DIAGNOSIS — E1169 Type 2 diabetes mellitus with other specified complication: Secondary | ICD-10-CM

## 2019-11-27 MED ORDER — FARXIGA 10 MG PO TABS: 10.0000 mg | ORAL_TABLET | Freq: Every day | ORAL | 0 refills | Status: DC

## 2019-11-27 NOTE — Telephone Encounter (Signed)
Pt confirms that he is NOT taking Invokana.  Advised pt of RX for farxiga and to start this new medication.  Pt expressed understanding and is agreeable.  Charyl Bigger, CMA

## 2019-11-27 NOTE — Telephone Encounter (Signed)
Please call pt and confirm whether or not he is taking the invokana or not.  I don't think he is ( but thnx for  confirming this).    Tell him to start the [place] sent into his pharmacy, f/up as planned.  No need to send back unless he has additional concerns.  Thank you! Dr Jenetta Downer

## 2020-01-11 ENCOUNTER — Telehealth: Payer: Self-pay | Admitting: Physician Assistant

## 2020-01-11 DIAGNOSIS — E1169 Type 2 diabetes mellitus with other specified complication: Secondary | ICD-10-CM

## 2020-01-11 MED ORDER — DAPAGLIFLOZIN PROPANEDIOL 10 MG PO TABS: 10.0000 mg | ORAL_TABLET | Freq: Every day | ORAL | 0 refills | Status: DC

## 2020-01-11 NOTE — Telephone Encounter (Signed)
Please call pt to schedule appt.  No further refills until pt is seen.  T. Miguel Christiana, CMA  

## 2020-01-11 NOTE — Telephone Encounter (Signed)
Patient called requested refill for  :  dapagliflozin propanediol (FARXIGA) 10 MG TABS tablet [id number]   Order Details Dose: 10 mg Route: Oral Frequency: Daily  Dispense Quantity: 30 tablet Refills: 0   Note to Pharmacy: Needs OV b-4 RF      Sig: Take 10 mg by mouth daily.       --Please send to :   [facility], [place] [phone] (Phone) [phone] (Fax)   --Please contact pt if there are any questions or concerns.  --glh

## 2020-01-19 ENCOUNTER — Other Ambulatory Visit: Payer: Self-pay | Admitting: Physician Assistant

## 2020-01-19 DIAGNOSIS — Z794 Long term (current) use of insulin: Secondary | ICD-10-CM

## 2020-01-19 DIAGNOSIS — I152 Hypertension secondary to endocrine disorders: Secondary | ICD-10-CM

## 2020-01-19 DIAGNOSIS — E1169 Type 2 diabetes mellitus with other specified complication: Secondary | ICD-10-CM

## 2020-01-22 ENCOUNTER — Other Ambulatory Visit: Payer: Self-pay

## 2020-01-22 ENCOUNTER — Other Ambulatory Visit: Payer: Medicare Other

## 2020-01-22 DIAGNOSIS — E1159 Type 2 diabetes mellitus with other circulatory complications: Secondary | ICD-10-CM

## 2020-01-22 DIAGNOSIS — I10 Essential (primary) hypertension: Secondary | ICD-10-CM | POA: Diagnosis not present

## 2020-01-22 DIAGNOSIS — E1169 Type 2 diabetes mellitus with other specified complication: Secondary | ICD-10-CM | POA: Diagnosis not present

## 2020-01-22 DIAGNOSIS — Z794 Long term (current) use of insulin: Secondary | ICD-10-CM | POA: Diagnosis not present

## 2020-01-22 DIAGNOSIS — E785 Hyperlipidemia, unspecified: Secondary | ICD-10-CM | POA: Diagnosis not present

## 2020-01-23 LAB — HEMOGLOBIN A1C
Est. average glucose Bld gHb Est-mCnc: 140 mg/dL
Hgb A1c MFr Bld: 6.5 % — ABNORMAL HIGH (ref 4.8–5.6)

## 2020-01-23 LAB — COMPREHENSIVE METABOLIC PANEL
ALT: 21 IU/L (ref 0–44)
AST: 18 IU/L (ref 0–40)
Albumin/Globulin Ratio: 2.2 (ref 1.2–2.2)
Albumin: 4.7 g/dL (ref 3.8–4.8)
Alkaline Phosphatase: 46 IU/L — ABNORMAL LOW (ref 48–121)
BUN/Creatinine Ratio: 13 (ref 10–24)
BUN: 13 mg/dL (ref 8–27)
Bilirubin Total: 0.4 mg/dL (ref 0.0–1.2)
CO2: 19 mmol/L — ABNORMAL LOW (ref 20–29)
Calcium: 10.1 mg/dL (ref 8.6–10.2)
Chloride: 102 mmol/L (ref 96–106)
Creatinine, Ser: 1 mg/dL (ref 0.76–1.27)
GFR calc Af Amer: 90 mL/min/{1.73_m2} (ref >59)
GFR calc non Af Amer: 78 mL/min/{1.73_m2} (ref >59)
Globulin, Total: 2.1 g/dL (ref 1.5–4.5)
Glucose: 121 mg/dL — ABNORMAL HIGH (ref 65–99)
Potassium: 4.4 mmol/L (ref 3.5–5.2)
Sodium: 139 mmol/L (ref 134–144)
Total Protein: 6.8 g/dL (ref 6.0–8.5)

## 2020-01-23 LAB — MAGNESIUM: Magnesium: 2 mg/dL (ref 1.6–2.3)

## 2020-01-26 ENCOUNTER — Encounter: Payer: Self-pay | Admitting: Physician Assistant

## 2020-01-26 ENCOUNTER — Ambulatory Visit (INDEPENDENT_AMBULATORY_CARE_PROVIDER_SITE_OTHER): Payer: Medicare Other | Admitting: Physician Assistant

## 2020-01-26 ENCOUNTER — Other Ambulatory Visit: Payer: Self-pay

## 2020-01-26 VITALS — BP 113/71 | HR 84 | Temp 98.1°F | Ht 69.0 in | Wt 243.3 lb

## 2020-01-26 DIAGNOSIS — Z794 Long term (current) use of insulin: Secondary | ICD-10-CM | POA: Diagnosis not present

## 2020-01-26 DIAGNOSIS — E1169 Type 2 diabetes mellitus with other specified complication: Secondary | ICD-10-CM | POA: Diagnosis not present

## 2020-01-26 DIAGNOSIS — K219 Gastro-esophageal reflux disease without esophagitis: Secondary | ICD-10-CM

## 2020-01-26 DIAGNOSIS — I152 Hypertension secondary to endocrine disorders: Secondary | ICD-10-CM

## 2020-01-26 DIAGNOSIS — E785 Hyperlipidemia, unspecified: Secondary | ICD-10-CM

## 2020-01-26 DIAGNOSIS — I10 Essential (primary) hypertension: Secondary | ICD-10-CM

## 2020-01-26 DIAGNOSIS — E1159 Type 2 diabetes mellitus with other circulatory complications: Secondary | ICD-10-CM

## 2020-01-26 MED ORDER — OMEPRAZOLE 20 MG PO CPDR: 20.0000 mg | DELAYED_RELEASE_CAPSULE | Freq: Every day | ORAL | 1 refills | Status: DC

## 2020-01-26 NOTE — Assessment & Plan Note (Addendum)
-   Most recent A1c is 6.5, stable and improved from prior. Magnesium wnl. - Continue Farxiga, Trulicity, Glipizide, Metformin - Discussed with patient adjusting medications and possibly discontinuing one of his medications if his A1c continues to be at goal or decreases. - Continue ambulatory glucose monitoring and notify clinic if FBS consistently <80 or >160. - Encourage low glucose/carbohydrate diet and increase physical activity.

## 2020-01-26 NOTE — Assessment & Plan Note (Addendum)
-   Last lipid panel wnl's. LDL 62 - Continue Simvastatin 20 mg - Follow heart healthy diet and increase walking to 2-3 times/week.  - Plan to recheck lipid panel next OV.

## 2020-01-26 NOTE — Assessment & Plan Note (Signed)
-   Stable - Continue Omeprazole, provided refills.

## 2020-01-26 NOTE — Progress Notes (Signed)
Established Patient Office Visit  Subjective:  Patient ID: Joseph Collier, male    DOB: 1950/09/20  Age: 69 y.o. MRN: [id number]  CC:  Chief Complaint  Patient presents with  . Hyperlipidemia  . Hypertension  . Diabetes    HPI Joseph Collier presents for chronic follow-up on hypertension, diabetes mellitus, and hyperlipidemia.  HTN: Pt denies chest pain, palpitations, dizziness or lower extremity swelling. Taking medication as directed without side effects. Doesn't check BP at home. He is trying to reduce his salt intake.  Diabetes: Pt denies increased urination or thirst. Pt reports medication compliance. No hypoglycemic events. Checking glucose at home and readings range 150-170.  He has reduced his carbohydrate intake. States he is walking about 2 miles once a week.  HLD: Pt taking medication as directed without issues. Denies side effects including myalgias and RUQ pain.   GERD: His symptoms are under control with omeprazole.  He is requesting a refill.   Past Medical History:  Diagnosis Date  . Allergic rhinitis, cause unspecified   . Anemia, unspecified   . Arthritis   . Cancer ([place]) 08/17/2006   Melanoma  . Essential hypertension, benign   . Impotence of organic origin 04/14/2007  . Obesity, unspecified   . Other and unspecified hyperlipidemia   . Personal history of colonic polyps   . Skin lesion 04/14/2007   melanoma    Past Surgical History:  Procedure Laterality Date  . APPENDECTOMY    . arthroscopy of knee  2008   Left Knee  . COLONOSCOPY  08/18/2007   normal. [name].  Marland Kitchen HERNIA REPAIR     age 31  . JOINT REPLACEMENT    . melanoma of forearm resection  1992   Left        [name]  . TONSILLECTOMY AND ADENOIDECTOMY      Family History  Problem Relation Age of Onset  . Coronary artery disease Father   . Hypertension Father   . Heart disease Father        AMI cause of death  . Diabetes Father   . Cancer Mother 25       lung  . Hypertension Brother    . Diabetes Brother     Social History   Socioeconomic History  . Marital status: Married    Spouse name: [name]  . Number of children: 1  . Years of education: 49  . Highest education level: Not on file  Occupational History  . Occupation: outside Press photographer    Comment: engineered woods since 2000    Employer: [employer]  Tobacco Use  . Smoking status: Former Smoker    Packs/day: 1.00    Years: 25.00    Pack years: 25.00    Types: Cigarettes    Quit date: 08/17/1989    Years since quitting: 30.5  . Smokeless tobacco: Never Used  . Tobacco comment: quit 1991  Vaping Use  . Vaping Use: Never used  Substance and Sexual Activity  . Alcohol use: Yes    Alcohol/week: 5.0 standard drinks    Types: 5 Cans of beer per week    Comment: moderate: drinks hard liquor 7 drinks per month in 08/2011/4 drinks 4 a week  . Drug use: No  . Sexual activity: Yes    Birth control/protection: Post-menopausal  Other Topics Concern  . Not on file  Social History Narrative   Marital status:  Married x 25 years, happily, second marriage.  Children:  1 son (78 yo.)  Son in [place], [place].       Lives with wife.       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      Tobacco: none       Alcohol:    3 drinks per night every other night.       Drugs: none       Exercise: Light, walking, 3 times a week.        Caffeine use: carbonated beverages; consumes a minimal amount.        Seatbelt:  Always uses seat belts, no texting.       Guns:  Guns in the home stored in locked cabinet.        Home Safety:  Smoke Alarms in the home.        Education: [employer].   Social Determinants of Health   Financial Resource Strain:   . Difficulty of Paying Living Expenses:   Food Insecurity:   . Worried About Charity fundraiser in the Last Year:   . Arboriculturist in the Last Year:   Transportation Needs:   . Film/video editor (Medical):   Marland Kitchen Lack  of Transportation (Non-Medical):   Physical Activity:   . Days of Exercise per Week:   . Minutes of Exercise per Session:   Stress:   . Feeling of Stress :   Social Connections:   . Frequency of Communication with Friends and Family:   . Frequency of Social Gatherings with Friends and Family:   . Attends Religious Services:   . Active Member of Clubs or Organizations:   . Attends Archivist Meetings:   Marland Kitchen Marital Status:   Intimate Partner Violence:   . Fear of Current or Ex-Partner:   . Emotionally Abused:   Marland Kitchen Physically Abused:   . Sexually Abused:     Outpatient Medications Prior to Visit  Medication Sig Dispense Refill  . aspirin EC 81 MG tablet Take 81 mg by mouth daily.    . dapagliflozin propanediol (FARXIGA) 10 MG TABS tablet Take 1 tablet (10 mg total) by mouth daily. 30 tablet 0  . Dulaglutide (TRULICITY) 1.5 MK/3.4JZ SOPN INJECT 1.5MG  SUBCUTANEOUSLY ONCE A WEEK 12 pen 1  . fluticasone (FLONASE) 50 MCG/ACT nasal spray Place 2 sprays into both nostrils daily.    Marland Kitchen glipiZIDE (GLUCOTROL) 10 MG tablet Take 1 tablet (10 mg total) by mouth 2 (two) times daily before a meal. 180 tablet 1  . glucose blood test strip Use as instructed 100 each 12  . lisinopril (ZESTRIL) 40 MG tablet Take 1 tablet (40 mg total) by mouth daily. 90 tablet 1  . metFORMIN (GLUCOPHAGE) 1000 MG tablet 1 tablet in morning and one tablet in evening with meal. 180 tablet 0  . sildenafil (REVATIO) 20 MG tablet TAKE 1-3 TABLETS BY MOUTH PRIOR TO INTERCOURSE AS  NEEDED 30 tablet 0  . simvastatin (ZOCOR) 20 MG tablet Take 1 tablet (20 mg total) by mouth at bedtime. 90 tablet 1  . omeprazole (PRILOSEC) 20 MG capsule Take 1 capsule by mouth once daily 90 capsule 0   No facility-administered medications prior to visit.    Allergies  Allergen Reactions  . Penicillins Hives and Swelling  . Saxagliptin   . Tetracyclines & Related Hives and Swelling    ROS Review of Systems  A fourteen system  review of systems was performed and  found to be positive as per HPI.    Objective:    Physical Exam General:  Well Developed, well nourished, appropriate for stated age.  Neuro:  Alert and oriented,  extra-ocular muscles intact  HEENT:  Normocephalic, atraumatic, neck supple  Skin:  no gross rash, warm, pink. Cardiac:  RRR, S1 S2 w/o murmur Respiratory:  ECTA B/L and A/P, Not using accessory muscles, speaking in full sentences- unlabored. Vascular:  Ext warm, no cyanosis apprec.; no edema present Psych:  No HI/SI, judgement and insight good, Euthymic mood. Full Affect.   BP 113/71   Pulse 84   Temp 98.1 F (36.7 C) (Oral)   Ht 5\' 9"  (1.753 m)   Wt 243 lb 4.8 oz (110.4 kg)   SpO2 98% Comment: on RA  BMI 35.93 kg/m  Wt Readings from Last 3 Encounters:  01/26/20 243 lb 4.8 oz (110.4 kg)  09/15/19 227 lb (103 kg)  06/16/19 230 lb (104.3 kg)     Health Maintenance Due  Topic Date Due  . OPHTHALMOLOGY EXAM  05/05/2017    There are no preventive care reminders to display for this patient.  Lab Results  Component Value Date   TSH 1.280 04/22/2017   Lab Results  Component Value Date   WBC 10.1 06/16/2019   HGB 15.5 06/16/2019   HCT 44.6 06/16/2019   MCV 87 06/16/2019   PLT 388 06/16/2019   Lab Results  Component Value Date   NA 139 01/22/2020   K 4.4 01/22/2020   CO2 19 (L) 01/22/2020   GLUCOSE 121 (H) 01/22/2020   BUN 13 01/22/2020   CREATININE 1.00 01/22/2020   BILITOT 0.4 01/22/2020   ALKPHOS 46 (L) 01/22/2020   AST 18 01/22/2020   ALT 21 01/22/2020   PROT 6.8 01/22/2020   ALBUMIN 4.7 01/22/2020   CALCIUM 10.1 01/22/2020   Lab Results  Component Value Date   CHOL 120 06/16/2019   Lab Results  Component Value Date   HDL 40 06/16/2019   Lab Results  Component Value Date   LDLCALC 62 06/16/2019   Lab Results  Component Value Date   TRIG 91 06/16/2019   Lab Results  Component Value Date   CHOLHDL 3.0 06/16/2019   Lab Results  Component  Value Date   HGBA1C 6.5 (H) 01/22/2020      Assessment & Plan:   Problem List Items Addressed This Visit      Cardiovascular and Mediastinum   Hypertension associated with diabetes ([place]) (Chronic)    - BP today is 113/71, at goal. - Continue Lisinopril 40 mg - Encourage ambulatory BP and pulse monitoring, especially when not feeling well. - Follow DASH diet. - Encourage to stay as active as possible.         Digestive   GERD (gastroesophageal reflux disease) (Chronic)    - Stable - Continue Omeprazole, provided refills.      Relevant Medications   omeprazole (PRILOSEC) 20 MG capsule     Endocrine   Hyperlipidemia associated with type 2 diabetes mellitus ([facility]) (Chronic)    - Last lipid panel wnl's. LDL 62 - Continue Simvastatin 20 mg - Follow heart healthy diet and increase walking to 2-3 times/week.  - Plan to recheck lipid panel next OV.       Type 2 diabetes mellitus with other specified complication ([place]) - Primary    - Most recent A1c is 6.5, stable and improved from prior. Magnesium wnl. - Continue Farxiga, Trulicity, Glipizide, Metformin - Discussed  with patient adjusting medications and possibly discontinuing one of his medications if his A1c continues to be at goal or decreases. - Continue ambulatory glucose monitoring and notify clinic if FBS consistently <80 or >160. - Encourage low glucose/carbohydrate diet and increase physical activity.          Meds ordered this encounter  Medications  . omeprazole (PRILOSEC) 20 MG capsule    Sig: Take 1 capsule (20 mg total) by mouth daily.    Dispense:  90 capsule    Refill:  1    Order Specific Question:   Supervising Provider    Answer:   Beatrice Lecher D [[id number]]    Follow-up: Return in about 4 months (around 05/27/2020) for DM, HTN, HLD and FBW (lipid panel, cmp, a1c).    Lorrene Reid, PA-C

## 2020-01-26 NOTE — Patient Instructions (Signed)
DASH Eating Plan DASH stands for "Dietary Approaches to Stop Hypertension." The DASH eating plan is a healthy eating plan that has been shown to reduce high blood pressure (hypertension). It may also reduce your risk for type 2 diabetes, heart disease, and stroke. The DASH eating plan may also help with weight loss. What are tips for following this plan?  General guidelines  Avoid eating more than 2,300 mg (milligrams) of salt (sodium) a day. If you have hypertension, you may need to reduce your sodium intake to 1,500 mg a day.  Limit alcohol intake to no more than 1 drink a day for nonpregnant women and 2 drinks a day for men. One drink equals 12 oz of beer, 5 oz of wine, or 1 oz of hard liquor.  Work with your health care provider to maintain a healthy body weight or to lose weight. Ask what an ideal weight is for you.  Get at least 30 minutes of exercise that causes your heart to beat faster (aerobic exercise) most days of the week. Activities may include walking, swimming, or biking.  Work with your health care provider or diet and nutrition specialist (dietitian) to adjust your eating plan to your individual calorie needs. Reading food labels   Check food labels for the amount of sodium per serving. Choose foods with less than 5 percent of the Daily Value of sodium. Generally, foods with less than 300 mg of sodium per serving fit into this eating plan.  To find whole grains, look for the word "whole" as the first word in the ingredient list. Shopping  Buy products labeled as "low-sodium" or "no salt added."  Buy fresh foods. Avoid canned foods and premade or frozen meals. Cooking  Avoid adding salt when cooking. Use salt-free seasonings or herbs instead of table salt or sea salt. Check with your health care provider or pharmacist before using salt substitutes.  Do not fry foods. Cook foods using healthy methods such as baking, boiling, grilling, and broiling instead.  Cook with  heart-healthy oils, such as olive, canola, soybean, or sunflower oil. Meal planning  Eat a balanced diet that includes: ? 5 or more servings of fruits and vegetables each day. At each meal, try to fill half of your plate with fruits and vegetables. ? Up to 6-8 servings of whole grains each day. ? Less than 6 oz of lean meat, poultry, or fish each day. A 3-oz serving of meat is about the same size as a deck of cards. One egg equals 1 oz. ? 2 servings of low-fat dairy each day. ? A serving of nuts, seeds, or beans 5 times each week. ? Heart-healthy fats. Healthy fats called Omega-3 fatty acids are found in foods such as flaxseeds and coldwater fish, like sardines, salmon, and mackerel.  Limit how much you eat of the following: ? Canned or prepackaged foods. ? Food that is high in trans fat, such as fried foods. ? Food that is high in saturated fat, such as fatty meat. ? Sweets, desserts, sugary drinks, and other foods with added sugar. ? Full-fat dairy products.  Do not salt foods before eating.  Try to eat at least 2 vegetarian meals each week.  Eat more home-cooked food and less restaurant, buffet, and fast food.  When eating at a restaurant, ask that your food be prepared with less salt or no salt, if possible. What foods are recommended? The items listed may not be a complete list. Talk with your dietitian about   what dietary choices are best for you. Grains Whole-grain or whole-wheat bread. Whole-grain or whole-wheat pasta. Brown rice. Oatmeal. Quinoa. Bulgur. Whole-grain and low-sodium cereals. Pita bread. Low-fat, low-sodium crackers. Whole-wheat flour tortillas. Vegetables Fresh or frozen vegetables (raw, steamed, roasted, or grilled). Low-sodium or reduced-sodium tomato and vegetable juice. Low-sodium or reduced-sodium tomato sauce and tomato paste. Low-sodium or reduced-sodium canned vegetables. Fruits All fresh, dried, or frozen fruit. Canned fruit in natural juice (without  added sugar). Meat and other protein foods Skinless chicken or turkey. Ground chicken or turkey. Pork with fat trimmed off. Fish and seafood. Egg whites. Dried beans, peas, or lentils. Unsalted nuts, nut butters, and seeds. Unsalted canned beans. Lean cuts of beef with fat trimmed off. Low-sodium, lean deli meat. Dairy Low-fat (1%) or fat-free (skim) milk. Fat-free, low-fat, or reduced-fat cheeses. Nonfat, low-sodium ricotta or cottage cheese. Low-fat or nonfat yogurt. Low-fat, low-sodium cheese. Fats and oils Soft margarine without trans fats. Vegetable oil. Low-fat, reduced-fat, or light mayonnaise and salad dressings (reduced-sodium). Canola, safflower, olive, soybean, and sunflower oils. Avocado. Seasoning and other foods Herbs. Spices. Seasoning mixes without salt. Unsalted popcorn and pretzels. Fat-free sweets. What foods are not recommended? The items listed may not be a complete list. Talk with your dietitian about what dietary choices are best for you. Grains Baked goods made with fat, such as croissants, muffins, or some breads. Dry pasta or rice meal packs. Vegetables Creamed or fried vegetables. Vegetables in a cheese sauce. Regular canned vegetables (not low-sodium or reduced-sodium). Regular canned tomato sauce and paste (not low-sodium or reduced-sodium). Regular tomato and vegetable juice (not low-sodium or reduced-sodium). Pickles. Olives. Fruits Canned fruit in a light or heavy syrup. Fried fruit. Fruit in cream or butter sauce. Meat and other protein foods Fatty cuts of meat. Ribs. Fried meat. Bacon. Sausage. Bologna and other processed lunch meats. Salami. Fatback. Hotdogs. Bratwurst. Salted nuts and seeds. Canned beans with added salt. Canned or smoked fish. Whole eggs or egg yolks. Chicken or turkey with skin. Dairy Whole or 2% milk, cream, and half-and-half. Whole or full-fat cream cheese. Whole-fat or sweetened yogurt. Full-fat cheese. Nondairy creamers. Whipped toppings.  Processed cheese and cheese spreads. Fats and oils Butter. Stick margarine. Lard. Shortening. Ghee. Bacon fat. Tropical oils, such as coconut, palm kernel, or palm oil. Seasoning and other foods Salted popcorn and pretzels. Onion salt, garlic salt, seasoned salt, table salt, and sea salt. Worcestershire sauce. Tartar sauce. Barbecue sauce. Teriyaki sauce. Soy sauce, including reduced-sodium. Steak sauce. Canned and packaged gravies. Fish sauce. Oyster sauce. Cocktail sauce. Horseradish that you find on the shelf. Ketchup. Mustard. Meat flavorings and tenderizers. Bouillon cubes. Hot sauce and Tabasco sauce. Premade or packaged marinades. Premade or packaged taco seasonings. Relishes. Regular salad dressings. Where to find more information:  National Heart, Lung, and Blood Institute: www.nhlbi.nih.gov  American Heart Association: www.heart.org Summary  The DASH eating plan is a healthy eating plan that has been shown to reduce high blood pressure (hypertension). It may also reduce your risk for type 2 diabetes, heart disease, and stroke.  With the DASH eating plan, you should limit salt (sodium) intake to 2,300 mg a day. If you have hypertension, you may need to reduce your sodium intake to 1,500 mg a day.  When on the DASH eating plan, aim to eat more fresh fruits and vegetables, whole grains, lean proteins, low-fat dairy, and heart-healthy fats.  Work with your health care provider or diet and nutrition specialist (dietitian) to adjust your eating plan to your   individual calorie needs. This information is not intended to replace advice given to you by your health care provider. Make sure you discuss any questions you have with your health care provider. Document Revised: 07/16/2017 Document Reviewed: 07/27/2016 Elsevier Patient Education  2020 Else[employer] Inc.  

## 2020-01-26 NOTE — Assessment & Plan Note (Signed)
-   BP today is 113/71, at goal. - Continue Lisinopril 40 mg - Encourage ambulatory BP and pulse monitoring, especially when not feeling well. - Follow DASH diet. - Encourage to stay as active as possible.

## 2020-02-02 ENCOUNTER — Other Ambulatory Visit: Payer: Self-pay | Admitting: Family Medicine

## 2020-02-02 DIAGNOSIS — E785 Hyperlipidemia, unspecified: Secondary | ICD-10-CM

## 2020-02-12 ENCOUNTER — Other Ambulatory Visit: Payer: Self-pay | Admitting: Physician Assistant

## 2020-02-12 DIAGNOSIS — E1169 Type 2 diabetes mellitus with other specified complication: Secondary | ICD-10-CM

## 2020-02-12 MED ORDER — DAPAGLIFLOZIN PROPANEDIOL 10 MG PO TABS: 10.0000 mg | ORAL_TABLET | Freq: Every day | ORAL | 0 refills | Status: DC

## 2020-02-12 MED ORDER — SIMVASTATIN 20 MG PO TABS: 20.0000 mg | ORAL_TABLET | Freq: Every day | ORAL | 0 refills | Status: DC

## 2020-02-12 MED ORDER — METFORMIN HCL 1000 MG PO TABS: ORAL_TABLET | ORAL | 0 refills | Status: DC

## 2020-03-27 ENCOUNTER — Other Ambulatory Visit: Payer: Self-pay | Admitting: Family Medicine

## 2020-03-27 DIAGNOSIS — E1169 Type 2 diabetes mellitus with other specified complication: Secondary | ICD-10-CM

## 2020-04-05 ENCOUNTER — Other Ambulatory Visit: Payer: Self-pay | Admitting: Physician Assistant

## 2020-04-05 DIAGNOSIS — E1169 Type 2 diabetes mellitus with other specified complication: Secondary | ICD-10-CM

## 2020-04-05 MED ORDER — GLIPIZIDE 10 MG PO TABS: 10.0000 mg | ORAL_TABLET | Freq: Two times a day (BID) | ORAL | 0 refills | Status: DC

## 2020-04-15 ENCOUNTER — Other Ambulatory Visit: Payer: Self-pay | Admitting: Family Medicine

## 2020-04-15 DIAGNOSIS — E1169 Type 2 diabetes mellitus with other specified complication: Secondary | ICD-10-CM

## 2020-04-15 DIAGNOSIS — N529 Male erectile dysfunction, unspecified: Secondary | ICD-10-CM

## 2020-04-21 ENCOUNTER — Other Ambulatory Visit: Payer: Self-pay | Admitting: Family Medicine

## 2020-04-21 DIAGNOSIS — N529 Male erectile dysfunction, unspecified: Secondary | ICD-10-CM

## 2020-04-23 ENCOUNTER — Other Ambulatory Visit: Payer: Self-pay | Admitting: Physician Assistant

## 2020-04-23 DIAGNOSIS — E1169 Type 2 diabetes mellitus with other specified complication: Secondary | ICD-10-CM

## 2020-04-23 MED ORDER — TRULICITY 1.5 MG/0.5ML ~~LOC~~ SOAJ: SUBCUTANEOUS | 0 refills | Status: DC

## 2020-04-23 MED ORDER — SILDENAFIL CITRATE 20 MG PO TABS: ORAL_TABLET | ORAL | 0 refills | Status: DC

## 2020-05-02 ENCOUNTER — Other Ambulatory Visit: Payer: Self-pay | Admitting: Physician Assistant

## 2020-05-02 ENCOUNTER — Other Ambulatory Visit: Payer: Self-pay | Admitting: Family Medicine

## 2020-05-02 DIAGNOSIS — E1159 Type 2 diabetes mellitus with other circulatory complications: Secondary | ICD-10-CM

## 2020-05-03 MED ORDER — METFORMIN HCL 1000 MG PO TABS: ORAL_TABLET | ORAL | 0 refills | Status: DC

## 2020-05-08 ENCOUNTER — Other Ambulatory Visit: Payer: Self-pay | Admitting: Physician Assistant

## 2020-05-08 DIAGNOSIS — E1169 Type 2 diabetes mellitus with other specified complication: Secondary | ICD-10-CM

## 2020-05-09 ENCOUNTER — Other Ambulatory Visit: Payer: Self-pay | Admitting: Physician Assistant

## 2020-05-09 DIAGNOSIS — E1159 Type 2 diabetes mellitus with other circulatory complications: Secondary | ICD-10-CM

## 2020-05-09 MED ORDER — LISINOPRIL 40 MG PO TABS: 40.0000 mg | ORAL_TABLET | Freq: Every day | ORAL | 0 refills | Status: DC

## 2020-05-12 DIAGNOSIS — Z23 Encounter for immunization: Secondary | ICD-10-CM | POA: Diagnosis not present

## 2020-05-23 ENCOUNTER — Other Ambulatory Visit: Payer: Self-pay | Admitting: Physician Assistant

## 2020-05-23 DIAGNOSIS — I152 Hypertension secondary to endocrine disorders: Secondary | ICD-10-CM

## 2020-05-23 DIAGNOSIS — Z794 Long term (current) use of insulin: Secondary | ICD-10-CM

## 2020-05-23 DIAGNOSIS — E785 Hyperlipidemia, unspecified: Secondary | ICD-10-CM

## 2020-05-27 ENCOUNTER — Other Ambulatory Visit: Payer: Medicare Other

## 2020-05-27 ENCOUNTER — Other Ambulatory Visit: Payer: Self-pay

## 2020-05-27 DIAGNOSIS — E1169 Type 2 diabetes mellitus with other specified complication: Secondary | ICD-10-CM

## 2020-05-27 DIAGNOSIS — E1159 Type 2 diabetes mellitus with other circulatory complications: Secondary | ICD-10-CM

## 2020-05-27 DIAGNOSIS — Z794 Long term (current) use of insulin: Secondary | ICD-10-CM

## 2020-05-27 DIAGNOSIS — I152 Hypertension secondary to endocrine disorders: Secondary | ICD-10-CM | POA: Diagnosis not present

## 2020-05-27 DIAGNOSIS — E785 Hyperlipidemia, unspecified: Secondary | ICD-10-CM | POA: Diagnosis not present

## 2020-05-28 LAB — COMPREHENSIVE METABOLIC PANEL
ALT: 32 IU/L (ref 0–44)
AST: 19 IU/L (ref 0–40)
Albumin/Globulin Ratio: 2.1 (ref 1.2–2.2)
Albumin: 4.5 g/dL (ref 3.8–4.8)
Alkaline Phosphatase: 42 IU/L — ABNORMAL LOW (ref 44–121)
BUN/Creatinine Ratio: 19 (ref 10–24)
BUN: 17 mg/dL (ref 8–27)
Bilirubin Total: 0.5 mg/dL (ref 0.0–1.2)
CO2: 24 mmol/L (ref 20–29)
Calcium: 9.9 mg/dL (ref 8.6–10.2)
Chloride: 102 mmol/L (ref 96–106)
Creatinine, Ser: 0.91 mg/dL (ref 0.76–1.27)
GFR calc Af Amer: 100 mL/min/{1.73_m2} (ref >59)
GFR calc non Af Amer: 87 mL/min/{1.73_m2} (ref >59)
Globulin, Total: 2.1 g/dL (ref 1.5–4.5)
Glucose: 138 mg/dL — ABNORMAL HIGH (ref 65–99)
Potassium: 5 mmol/L (ref 3.5–5.2)
Sodium: 141 mmol/L (ref 134–144)
Total Protein: 6.6 g/dL (ref 6.0–8.5)

## 2020-05-28 LAB — HEMOGLOBIN A1C
Est. average glucose Bld gHb Est-mCnc: 146 mg/dL
Hgb A1c MFr Bld: 6.7 % — ABNORMAL HIGH (ref 4.8–5.6)

## 2020-05-28 LAB — LIPID PANEL
Chol/HDL Ratio: 2.7 ratio (ref 0.0–5.0)
Cholesterol, Total: 101 mg/dL (ref 100–199)
HDL: 38 mg/dL — ABNORMAL LOW (ref >39)
LDL Chol Calc (NIH): 46 mg/dL (ref 0–99)
Triglycerides: 83 mg/dL (ref 0–149)
VLDL Cholesterol Cal: 17 mg/dL (ref 5–40)

## 2020-05-30 ENCOUNTER — Other Ambulatory Visit: Payer: Self-pay

## 2020-05-30 ENCOUNTER — Encounter: Payer: Self-pay | Admitting: Physician Assistant

## 2020-05-30 ENCOUNTER — Ambulatory Visit (INDEPENDENT_AMBULATORY_CARE_PROVIDER_SITE_OTHER): Payer: Medicare Other | Admitting: Physician Assistant

## 2020-05-30 VITALS — BP 124/77 | HR 98 | Ht 69.0 in | Wt 248.8 lb

## 2020-05-30 DIAGNOSIS — E1169 Type 2 diabetes mellitus with other specified complication: Secondary | ICD-10-CM

## 2020-05-30 DIAGNOSIS — Z794 Long term (current) use of insulin: Secondary | ICD-10-CM

## 2020-05-30 DIAGNOSIS — N529 Male erectile dysfunction, unspecified: Secondary | ICD-10-CM | POA: Diagnosis not present

## 2020-05-30 DIAGNOSIS — E785 Hyperlipidemia, unspecified: Secondary | ICD-10-CM

## 2020-05-30 DIAGNOSIS — E1159 Type 2 diabetes mellitus with other circulatory complications: Secondary | ICD-10-CM

## 2020-05-30 DIAGNOSIS — I152 Hypertension secondary to endocrine disorders: Secondary | ICD-10-CM | POA: Diagnosis not present

## 2020-05-30 NOTE — Assessment & Plan Note (Signed)
-  BP stable  -Continue current medication regimen. -Follow low sodium diet. -Continue to stay active. -Recent CMP wnl -Will continue to monitor.

## 2020-05-30 NOTE — Progress Notes (Signed)
Established Patient Office Visit  Subjective:  Patient ID: Joseph Collier, male    DOB: 09/14/1950  Age: 69 y.o. MRN: [id number]  CC:  Chief Complaint  Patient presents with  . Hypertension  . Hyperlipidemia  . Diabetes    HPI Joseph Collier presents for follow up on hypertension, hyperlipidemia, and diabetes mellitus. Has no acute concerns. Patient reports has obtained his influenza vaccine at [facility]  HTN: Pt denies chest pain, palpitations, dizziness or lower extremity swelling. Taking medication as directed without side effects. Doesn't check BP at home. Hasn't felt like his blood pressure has been elevated. Pt tries to stay hydrated.  HLD: Pt taking medication as directed without issues. Denies side effects. Reports he hasn't been as active the past 2 months due to due back pain, which has improved within the past few days.  Diabetes: Pt denies increased urination or thirst. Pt reports medication compliance. No hypoglycemic events. Occasionally checks glucose at home and FBS vary from 90-160. States he has gained weight most likely from snacking more and being less active.   Past Medical History:  Diagnosis Date  . Allergic rhinitis, cause unspecified   . Anemia, unspecified   . Arthritis   . Cancer ([place]) 08/17/2006   Melanoma  . Essential hypertension, benign   . Impotence of organic origin 04/14/2007  . Obesity, unspecified   . Other and unspecified hyperlipidemia   . Personal history of colonic polyps   . Skin lesion 04/14/2007   melanoma    Past Surgical History:  Procedure Laterality Date  . APPENDECTOMY    . arthroscopy of knee  2008   Left Knee  . COLONOSCOPY  08/18/2007   normal. [name].  Marland Kitchen HERNIA REPAIR     age 34  . JOINT REPLACEMENT    . melanoma of forearm resection  1992   Left        Joseph Collier  . TONSILLECTOMY AND ADENOIDECTOMY      Family History  Problem Relation Age of Onset  . Coronary artery disease Father   . Hypertension Father   . Heart  disease Father        AMI cause of death  . Diabetes Father   . Cancer Mother 41       lung  . Hypertension Brother   . Diabetes Brother     Social History   Socioeconomic History  . Marital status: Married    Spouse name: [name]  . Number of children: 1  . Years of education: 63  . Highest education level: Not on file  Occupational History  . Occupation: outside Press photographer    Comment: engineered woods since 2000    Employer: [employer]  Tobacco Use  . Smoking status: Former Smoker    Packs/day: 1.00    Years: 25.00    Pack years: 25.00    Types: Cigarettes    Quit date: 08/17/1989    Years since quitting: 30.8  . Smokeless tobacco: Never Used  . Tobacco comment: quit 1991  Vaping Use  . Vaping Use: Never used  Substance and Sexual Activity  . Alcohol use: Yes    Alcohol/week: 5.0 standard drinks    Types: 5 Cans of beer per week    Comment: moderate: drinks hard liquor 7 drinks per month in 08/2011/4 drinks 4 a week  . Drug use: No  . Sexual activity: Yes    Birth control/protection: Post-menopausal  Other Topics Concern  . Not on file  Social  History Narrative   Marital status:  Married x 25 years, happily, second marriage.       Children:  1 son (47 yo.)  Son in [place], [place].       Lives with wife.       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      Tobacco: none       Alcohol:    3 drinks per night every other night.       Drugs: none       Exercise: Light, walking, 3 times a week.        Caffeine use: carbonated beverages; consumes a minimal amount.        Seatbelt:  Always uses seat belts, no texting.       Guns:  Guns in the home stored in locked cabinet.        Home Safety:  Smoke Alarms in the home.        Education: [employer].   Social Determinants of Health   Financial Resource Strain:   . Difficulty of Paying Living Expenses: Not on file  Food Insecurity:   . Worried About Charity fundraiser  in the Last Year: Not on file  . Ran Out of Food in the Last Year: Not on file  Transportation Needs:   . Lack of Transportation (Medical): Not on file  . Lack of Transportation (Non-Medical): Not on file  Physical Activity:   . Days of Exercise per Week: Not on file  . Minutes of Exercise per Session: Not on file  Stress:   . Feeling of Stress : Not on file  Social Connections:   . Frequency of Communication with Friends and Family: Not on file  . Frequency of Social Gatherings with Friends and Family: Not on file  . Attends Religious Services: Not on file  . Active Member of Clubs or Organizations: Not on file  . Attends Archivist Meetings: Not on file  . Marital Status: Not on file  Intimate Partner Violence:   . Fear of Current or Ex-Partner: Not on file  . Emotionally Abused: Not on file  . Physically Abused: Not on file  . Sexually Abused: Not on file    Outpatient Medications Prior to Visit  Medication Sig Dispense Refill  . aspirin EC 81 MG tablet Take 81 mg by mouth daily.    . Dulaglutide (TRULICITY) 1.5 TD/3.2KG SOPN INJECT 1.5MG  SUBCUTANEOUSLY ONCE A WEEK 6 mL 0  . FARXIGA 10 MG TABS tablet Take 1 tablet by mouth once daily 90 tablet 0  . fluticasone (FLONASE) 50 MCG/ACT nasal spray Place 2 sprays into both nostrils daily.    Marland Kitchen glipiZIDE (GLUCOTROL) 10 MG tablet Take 1 tablet (10 mg total) by mouth 2 (two) times daily before a meal. 180 tablet 0  . glucose blood test strip Use as instructed 100 each 12  . lisinopril (ZESTRIL) 40 MG tablet Take 1 tablet (40 mg total) by mouth daily. 90 tablet 0  . metFORMIN (GLUCOPHAGE) 1000 MG tablet 1 tablet in morning and one tablet in evening with meal. 180 tablet 0  . omeprazole (PRILOSEC) 20 MG capsule Take 1 capsule (20 mg total) by mouth daily. 90 capsule 1  . sildenafil (REVATIO) 20 MG tablet TAKE 1-3 TABLETS BY MOUTH PRIOR TO INTERCOURSE AS  NEEDED 30 tablet 0  . simvastatin (ZOCOR) 20 MG tablet Take 1 tablet (20 mg  total) by mouth at  bedtime. 90 tablet 0   No facility-administered medications prior to visit.    Allergies  Allergen Reactions  . Penicillins Hives and Swelling  . Saxagliptin   . Tetracyclines & Related Hives and Swelling    ROS Review of Systems A fourteen system review of systems was performed and found to be positive as per HPI.   Objective:    Physical Exam General:  Well Developed, well nourished, appropriate for stated age.  Neuro:  Alert and oriented,  extra-ocular muscles intact, no focal deficits  HEENT:  Normocephalic, atraumatic, neck supple Skin:  no gross rash, warm, pink. Cardiac:  RRR, S1 S2 Respiratory:  ECTA B/L and A/P, Not using accessory muscles, speaking in full sentences- unlabored. Vascular:  Ext warm, no cyanosis apprec.; no gross edema Psych:  No HI/SI, judgement and insight good, Euthymic mood. Full Affect.   BP 124/77   Pulse 98   Ht 5\' 9"  (1.753 m)   Wt 248 lb 12.8 oz (112.9 kg)   SpO2 97%   BMI 36.74 kg/m  Wt Readings from Last 3 Encounters:  05/30/20 248 lb 12.8 oz (112.9 kg)  01/26/20 243 lb 4.8 oz (110.4 kg)  09/15/19 227 lb (103 kg)     Health Maintenance Due  Topic Date Due  . OPHTHALMOLOGY EXAM  05/05/2017  . COLONOSCOPY  01/24/2018  . INFLUENZA VACCINE  03/17/2020    There are no preventive care reminders to display for this patient.  Lab Results  Component Value Date   TSH 1.280 04/22/2017   Lab Results  Component Value Date   WBC 10.1 06/16/2019   HGB 15.5 06/16/2019   HCT 44.6 06/16/2019   MCV 87 06/16/2019   PLT 388 06/16/2019   Lab Results  Component Value Date   NA 141 05/27/2020   K 5.0 05/27/2020   CO2 24 05/27/2020   GLUCOSE 138 (H) 05/27/2020   BUN 17 05/27/2020   CREATININE 0.91 05/27/2020   BILITOT 0.5 05/27/2020   ALKPHOS 42 (L) 05/27/2020   AST 19 05/27/2020   ALT 32 05/27/2020   PROT 6.6 05/27/2020   ALBUMIN 4.5 05/27/2020   CALCIUM 9.9 05/27/2020   Lab Results  Component Value Date    CHOL 101 05/27/2020   Lab Results  Component Value Date   HDL 38 (L) 05/27/2020   Lab Results  Component Value Date   LDLCALC 46 05/27/2020   Lab Results  Component Value Date   TRIG 83 05/27/2020   Lab Results  Component Value Date   CHOLHDL 2.7 05/27/2020   Lab Results  Component Value Date   HGBA1C 6.7 (H) 05/27/2020      Assessment & Plan:   Problem List Items Addressed This Visit      Cardiovascular and Mediastinum   Hypertension associated with diabetes ([place]) - Primary (Chronic)    -BP stable  -Continue current medication regimen. -Follow low sodium diet. -Continue to stay active. -Recent CMP wnl -Will continue to monitor.        Endocrine   Hyperlipidemia associated with type 2 diabetes mellitus (HCC) (Chronic)    -Most recent lipid panel: LDL 46 at goal, HDL low -Continue current medication regimen. Recent hepatic function wnl -Follow a heart healthy diet low in saturated and trans fats. -Stay active. -Will continue to monitor.      Type 2 diabetes mellitus with other specified complication (HCC)    -Most recent A1c stable, increased from 6.5 to 6.7 -Continue current medication regimen. -Reduce carbohydrates  and glucose. -Advised to schedule annual eye exam. -Will continue to monitor.        Other   Erectile dysfunction (Chronic)    -Stable, continue current treatment plan.         No orders of the defined types were placed in this encounter.   Follow-up: Return in about 4 months (around 09/30/2020) for [place] and [place].   Note:  This note was prepared with assistance of Dragon voice recognition software. Occasional wrong-word or sound-a-like substitutions may have occurred due to the inherent limitations of voice recognition software.  Lorrene Reid, PA-C

## 2020-05-30 NOTE — Patient Instructions (Addendum)
COVID VACCINE SCHEDULING [phone]  Diabetes Mellitus and Nutrition, Adult When you have diabetes (diabetes mellitus), it is very important to have healthy eating habits because your blood sugar (glucose) levels are greatly affected by what you eat and drink. Eating healthy foods in the appropriate amounts, at about the same times every day, can help you:  Control your blood glucose.  Lower your risk of heart disease.  Improve your blood pressure.  Reach or maintain a healthy weight. Every person with diabetes is different, and each person has different needs for a meal plan. Your health care provider may recommend that you work with a diet and nutrition specialist (dietitian) to make a meal plan that is best for you. Your meal plan may vary depending on factors such as:  The calories you need.  The medicines you take.  Your weight.  Your blood glucose, blood pressure, and cholesterol levels.  Your activity level.  Other health conditions you have, such as heart or kidney disease. How do carbohydrates affect me? Carbohydrates, also called carbs, affect your blood glucose level more than any other type of food. Eating carbs naturally raises the amount of glucose in your blood. Carb counting is a method for keeping track of how many carbs you eat. Counting carbs is important to keep your blood glucose at a healthy level, especially if you use insulin or take certain oral diabetes medicines. It is important to know how many carbs you can safely have in each meal. This is different for every person. Your dietitian can help you calculate how many carbs you should have at each meal and for each snack. Foods that contain carbs include:  Bread, cereal, rice, pasta, and crackers.  Potatoes and corn.  Peas, beans, and lentils.  Milk and yogurt.  Fruit and juice.  Desserts, such as cakes, cookies, ice cream, and candy. How does alcohol affect me? Alcohol can cause a sudden decrease  in blood glucose (hypoglycemia), especially if you use insulin or take certain oral diabetes medicines. Hypoglycemia can be a life-threatening condition. Symptoms of hypoglycemia (sleepiness, dizziness, and confusion) are similar to symptoms of having too much alcohol. If your health care provider says that alcohol is safe for you, follow these guidelines:  Limit alcohol intake to no more than 1 drink per day for nonpregnant women and 2 drinks per day for men. One drink equals 12 oz of beer, 5 oz of wine, or 1 oz of hard liquor.  Do not drink on an empty stomach.  Keep yourself hydrated with water, diet soda, or unsweetened iced tea.  Keep in mind that regular soda, juice, and other mixers may contain a lot of sugar and must be counted as carbs. What are tips for following this plan?  Reading food labels  Start by checking the serving size on the "Nutrition Facts" label of packaged foods and drinks. The amount of calories, carbs, fats, and other nutrients listed on the label is based on one serving of the item. Many items contain more than one serving per package.  Check the total grams (g) of carbs in one serving. You can calculate the number of servings of carbs in one serving by dividing the total carbs by 15. For example, if a food has 30 g of total carbs, it would be equal to 2 servings of carbs.  Check the number of grams (g) of saturated and trans fats in one serving. Choose foods that have low or no amount of these fats.  Check the number of milligrams (mg) of salt (sodium) in one serving. Most people should limit total sodium intake to less than 2,300 mg per day.  Always check the nutrition information of foods labeled as "low-fat" or "nonfat". These foods may be higher in added sugar or refined carbs and should be avoided.  Talk to your dietitian to identify your daily goals for nutrients listed on the label. Shopping  Avoid buying canned, premade, or processed foods. These  foods tend to be high in fat, sodium, and added sugar.  Shop around the outside edge of the grocery store. This includes fresh fruits and vegetables, bulk grains, fresh meats, and fresh dairy. Cooking  Use low-heat cooking methods, such as baking, instead of high-heat cooking methods like deep frying.  Cook using healthy oils, such as olive, canola, or sunflower oil.  Avoid cooking with butter, cream, or high-fat meats. Meal planning  Eat meals and snacks regularly, preferably at the same times every day. Avoid going long periods of time without eating.  Eat foods high in fiber, such as fresh fruits, vegetables, beans, and whole grains. Talk to your dietitian about how many servings of carbs you can eat at each meal.  Eat 4-6 ounces (oz) of lean protein each day, such as lean meat, chicken, fish, eggs, or tofu. One oz of lean protein is equal to: ? 1 oz of meat, chicken, or fish. ? 1 egg. ?  cup of tofu.  Eat some foods each day that contain healthy fats, such as avocado, nuts, seeds, and fish. Lifestyle  Check your blood glucose regularly.  Exercise regularly as told by your health care provider. This may include: ? 150 minutes of moderate-intensity or vigorous-intensity exercise each week. This could be brisk walking, biking, or water aerobics. ? Stretching and doing strength exercises, such as yoga or weightlifting, at least 2 times a week.  Take medicines as told by your health care provider.  Do not use any products that contain nicotine or tobacco, such as cigarettes and e-cigarettes. If you need help quitting, ask your health care provider.  Work with a Social worker or diabetes educator to identify strategies to manage stress and any emotional and social challenges. Questions to ask a health care provider  Do I need to meet with a diabetes educator?  Do I need to meet with a dietitian?  What number can I call if I have questions?  When are the best times to check my  blood glucose? Where to find more information:  American Diabetes Association: diabetes.org  Academy of Nutrition and Dietetics: www.eatright.[employer] of Diabetes and Digestive and Kidney Diseases (NIH): [employer] Summary  A healthy meal plan will help you control your blood glucose and maintain a healthy lifestyle.  Working with a diet and nutrition specialist (dietitian) can help you make a meal plan that is best for you.  Keep in mind that carbohydrates (carbs) and alcohol have immediate effects on your blood glucose levels. It is important to count carbs and to use alcohol carefully. This information is not intended to replace advice given to you by your health care provider. Make sure you discuss any questions you have with your health care provider. Document Revised: 07/16/2017 Document Reviewed: 09/07/2016 Elsevier Patient Education  2020 [employer].

## 2020-05-30 NOTE — Assessment & Plan Note (Addendum)
-  Most recent A1c stable, increased from 6.5 to 6.7 -Continue current medication regimen. -Reduce carbohydrates and glucose. -Advised to schedule annual eye exam. -Will continue to monitor.

## 2020-05-30 NOTE — Assessment & Plan Note (Signed)
-  Stable, continue current treatment plan.

## 2020-05-30 NOTE — Assessment & Plan Note (Signed)
-  Most recent lipid panel: LDL 46 at goal, HDL low -Continue current medication regimen. Recent hepatic function wnl -Follow a heart healthy diet low in saturated and trans fats. -Stay active. -Will continue to monitor.

## 2020-06-22 ENCOUNTER — Other Ambulatory Visit: Payer: Self-pay | Admitting: Physician Assistant

## 2020-06-22 DIAGNOSIS — K219 Gastro-esophageal reflux disease without esophagitis: Secondary | ICD-10-CM

## 2020-06-22 DIAGNOSIS — E785 Hyperlipidemia, unspecified: Secondary | ICD-10-CM

## 2020-06-22 DIAGNOSIS — E1169 Type 2 diabetes mellitus with other specified complication: Secondary | ICD-10-CM

## 2020-06-22 DIAGNOSIS — N529 Male erectile dysfunction, unspecified: Secondary | ICD-10-CM

## 2020-07-08 ENCOUNTER — Other Ambulatory Visit: Payer: Self-pay | Admitting: Physician Assistant

## 2020-07-08 DIAGNOSIS — E1169 Type 2 diabetes mellitus with other specified complication: Secondary | ICD-10-CM

## 2020-07-08 MED ORDER — TRULICITY 1.5 MG/0.5ML ~~LOC~~ SOAJ: SUBCUTANEOUS | 0 refills | Status: DC

## 2020-07-31 DIAGNOSIS — E119 Type 2 diabetes mellitus without complications: Secondary | ICD-10-CM | POA: Diagnosis not present

## 2020-07-31 LAB — HM DIABETES EYE EXAM

## 2020-08-07 ENCOUNTER — Other Ambulatory Visit: Payer: Self-pay | Admitting: Physician Assistant

## 2020-08-07 ENCOUNTER — Encounter: Payer: Self-pay | Admitting: Physician Assistant

## 2020-08-07 DIAGNOSIS — K219 Gastro-esophageal reflux disease without esophagitis: Secondary | ICD-10-CM

## 2020-08-07 MED ORDER — OMEPRAZOLE 20 MG PO CPDR: 20.0000 mg | DELAYED_RELEASE_CAPSULE | Freq: Every day | ORAL | 1 refills | Status: DC

## 2020-08-07 MED ORDER — METFORMIN HCL 1000 MG PO TABS: ORAL_TABLET | ORAL | 0 refills | Status: DC

## 2020-08-11 ENCOUNTER — Other Ambulatory Visit: Payer: Self-pay | Admitting: Physician Assistant

## 2020-08-11 DIAGNOSIS — E1159 Type 2 diabetes mellitus with other circulatory complications: Secondary | ICD-10-CM

## 2020-08-11 DIAGNOSIS — E1169 Type 2 diabetes mellitus with other specified complication: Secondary | ICD-10-CM

## 2020-08-13 MED ORDER — LISINOPRIL 40 MG PO TABS: 40.0000 mg | ORAL_TABLET | Freq: Every day | ORAL | 0 refills | Status: DC

## 2020-08-13 MED ORDER — DAPAGLIFLOZIN PROPANEDIOL 10 MG PO TABS: 10.0000 mg | ORAL_TABLET | Freq: Every day | ORAL | 0 refills | Status: DC

## 2020-08-16 DIAGNOSIS — Z23 Encounter for immunization: Secondary | ICD-10-CM | POA: Diagnosis not present

## 2020-09-23 ENCOUNTER — Other Ambulatory Visit: Payer: Self-pay | Admitting: Physician Assistant

## 2020-09-23 DIAGNOSIS — E1169 Type 2 diabetes mellitus with other specified complication: Secondary | ICD-10-CM

## 2020-09-23 DIAGNOSIS — E785 Hyperlipidemia, unspecified: Secondary | ICD-10-CM

## 2020-09-23 DIAGNOSIS — E1159 Type 2 diabetes mellitus with other circulatory complications: Secondary | ICD-10-CM

## 2020-09-23 DIAGNOSIS — Z0000 Encounter for general adult medical examination without abnormal findings: Secondary | ICD-10-CM

## 2020-09-27 ENCOUNTER — Other Ambulatory Visit: Payer: Medicare Other

## 2020-09-27 ENCOUNTER — Other Ambulatory Visit: Payer: Self-pay | Admitting: Physician Assistant

## 2020-09-27 ENCOUNTER — Other Ambulatory Visit: Payer: Self-pay

## 2020-09-27 DIAGNOSIS — E1159 Type 2 diabetes mellitus with other circulatory complications: Secondary | ICD-10-CM

## 2020-09-27 DIAGNOSIS — I152 Hypertension secondary to endocrine disorders: Secondary | ICD-10-CM | POA: Diagnosis not present

## 2020-09-27 DIAGNOSIS — E785 Hyperlipidemia, unspecified: Secondary | ICD-10-CM | POA: Diagnosis not present

## 2020-09-27 DIAGNOSIS — Z0000 Encounter for general adult medical examination without abnormal findings: Secondary | ICD-10-CM | POA: Diagnosis not present

## 2020-09-27 DIAGNOSIS — Z794 Long term (current) use of insulin: Secondary | ICD-10-CM

## 2020-09-27 DIAGNOSIS — E1169 Type 2 diabetes mellitus with other specified complication: Secondary | ICD-10-CM | POA: Diagnosis not present

## 2020-09-28 LAB — COMPREHENSIVE METABOLIC PANEL
ALT: 38 IU/L (ref 0–44)
AST: 24 IU/L (ref 0–40)
Albumin/Globulin Ratio: 2.4 — ABNORMAL HIGH (ref 1.2–2.2)
Albumin: 4.7 g/dL (ref 3.8–4.8)
Alkaline Phosphatase: 45 IU/L (ref 44–121)
BUN/Creatinine Ratio: 14 (ref 10–24)
BUN: 14 mg/dL (ref 8–27)
Bilirubin Total: 0.7 mg/dL (ref 0.0–1.2)
CO2: 22 mmol/L (ref 20–29)
Calcium: 9.4 mg/dL (ref 8.6–10.2)
Chloride: 97 mmol/L (ref 96–106)
Creatinine, Ser: 1.01 mg/dL (ref 0.76–1.27)
GFR calc Af Amer: 89 mL/min/{1.73_m2} (ref >59)
GFR calc non Af Amer: 77 mL/min/{1.73_m2} (ref >59)
Globulin, Total: 2 g/dL (ref 1.5–4.5)
Glucose: 151 mg/dL — ABNORMAL HIGH (ref 65–99)
Potassium: 4.7 mmol/L (ref 3.5–5.2)
Sodium: 133 mmol/L — ABNORMAL LOW (ref 134–144)
Total Protein: 6.7 g/dL (ref 6.0–8.5)

## 2020-09-28 LAB — HEMOGLOBIN A1C
Est. average glucose Bld gHb Est-mCnc: 148 mg/dL
Hgb A1c MFr Bld: 6.8 % — ABNORMAL HIGH (ref 4.8–5.6)

## 2020-09-28 LAB — LIPID PANEL
Chol/HDL Ratio: 2.9 ratio (ref 0.0–5.0)
Cholesterol, Total: 107 mg/dL (ref 100–199)
HDL: 37 mg/dL — ABNORMAL LOW (ref >39)
LDL Chol Calc (NIH): 55 mg/dL (ref 0–99)
Triglycerides: 74 mg/dL (ref 0–149)
VLDL Cholesterol Cal: 15 mg/dL (ref 5–40)

## 2020-09-28 LAB — CBC
Hematocrit: 45.6 % (ref 37.5–51.0)
Hemoglobin: 15.1 g/dL (ref 13.0–17.7)
MCH: 29.4 pg (ref 26.6–33.0)
MCHC: 33.1 g/dL (ref 31.5–35.7)
MCV: 89 fL (ref 79–97)
Platelets: 305 10*3/uL (ref 150–450)
RBC: 5.13 x10E6/uL (ref 4.14–5.80)
RDW: 12.4 % (ref 11.6–15.4)
WBC: 8.6 10*3/uL (ref 3.4–10.8)

## 2020-09-28 LAB — TSH: TSH: 1.57 u[IU]/mL (ref 0.450–4.500)

## 2020-10-02 ENCOUNTER — Encounter: Payer: Self-pay | Admitting: Physician Assistant

## 2020-10-02 ENCOUNTER — Other Ambulatory Visit: Payer: Self-pay

## 2020-10-02 ENCOUNTER — Ambulatory Visit (INDEPENDENT_AMBULATORY_CARE_PROVIDER_SITE_OTHER): Payer: Medicare Other | Admitting: Physician Assistant

## 2020-10-02 VITALS — BP 158/92 | HR 80 | Temp 97.3°F | Ht 69.0 in | Wt 249.9 lb

## 2020-10-02 DIAGNOSIS — Z794 Long term (current) use of insulin: Secondary | ICD-10-CM | POA: Diagnosis not present

## 2020-10-02 DIAGNOSIS — Z0000 Encounter for general adult medical examination without abnormal findings: Secondary | ICD-10-CM

## 2020-10-02 DIAGNOSIS — E1169 Type 2 diabetes mellitus with other specified complication: Secondary | ICD-10-CM | POA: Diagnosis not present

## 2020-10-02 DIAGNOSIS — Z1211 Encounter for screening for malignant neoplasm of colon: Secondary | ICD-10-CM | POA: Diagnosis not present

## 2020-10-02 DIAGNOSIS — Z23 Encounter for immunization: Secondary | ICD-10-CM

## 2020-10-02 LAB — POCT UA - MICROALBUMIN
Albumin/Creatinine Ratio, Urine, POC: <30
Creatinine, POC: 100 mg/dL
Microalbumin Ur, POC: 10 mg/L

## 2020-10-02 NOTE — Patient Instructions (Signed)
Preventive Care 65 Years and Older, Male Preventive care refers to lifestyle choices and visits with your health care provider that can promote health and wellness. This includes:  A yearly physical exam. This is also called an annual wellness visit.  Regular dental and eye exams.  Immunizations.  Screening for certain conditions.  Healthy lifestyle choices, such as: ? Eating a healthy diet. ? Getting regular exercise. ? Not using drugs or products that contain nicotine and tobacco. ? Limiting alcohol use. What can I expect for my preventive care visit? Physical exam Your health care provider will check your:  Height and weight. These may be used to calculate your BMI (body mass index). BMI is a measurement that tells if you are at a healthy weight.  Heart rate and blood pressure.  Body temperature.  Skin for abnormal spots. Counseling Your health care provider may ask you questions about your:  Past medical problems.  Family's medical history.  Alcohol, tobacco, and drug use.  Emotional well-being.  Home life and relationship well-being.  Sexual activity.  Diet, exercise, and sleep habits.  History of falls.  Memory and ability to understand (cognition).  Work and work environment.  Access to firearms. What immunizations do I need? Vaccines are usually given at various ages, according to a schedule. Your health care provider will recommend vaccines for you based on your age, medical history, and lifestyle or other factors, such as travel or where you work.   What tests do I need? Blood tests  Lipid and cholesterol levels. These may be checked every 5 years, or more often depending on your overall health.  Hepatitis C test.  Hepatitis B test. Screening  Lung cancer screening. You may have this screening every year starting at age 55 if you have a 30-pack-year history of smoking and currently smoke or have quit within the past 15 years.  Colorectal  cancer screening. ? All adults should have this screening starting at age 50 and continuing until age 75. ? Your health care provider may recommend screening at age 45 if you are at increased risk. ? You will have tests every 1-10 years, depending on your results and the type of screening test.  Prostate cancer screening. Recommendations will vary depending on your family history and other risks.  Genital exam to check for testicular cancer or hernias.  Diabetes screening. ? This is done by checking your blood sugar (glucose) after you have not eaten for a while (fasting). ? You may have this done every 1-3 years.  Abdominal aortic aneurysm (AAA) screening. You may need this if you are a current or former smoker.  STD (sexually transmitted disease) testing, if you are at risk. Follow these instructions at home: Eating and drinking  Eat a diet that includes fresh fruits and vegetables, whole grains, lean protein, and low-fat dairy products. Limit your intake of foods with high amounts of sugar, saturated fats, and salt.  Take vitamin and mineral supplements as recommended by your health care provider.  Do not drink alcohol if your health care provider tells you not to drink.  If you drink alcohol: ? Limit how much you have to 0-2 drinks a day. ? Be aware of how much alcohol is in your drink. In the [place] one drink equals one 12 oz bottle of beer (355 mL), one 5 oz glass of wine (148 mL), or one 1 oz glass of hard liquor (44 mL).   Lifestyle  Take daily care of your teeth   and gums. Brush your teeth every morning and night with fluoride toothpaste. Floss one time each day.  Stay active. Exercise for at least 30 minutes 5 or more days each week.  Do not use any products that contain nicotine or tobacco, such as cigarettes, e-cigarettes, and chewing tobacco. If you need help quitting, ask your health care provider.  Do not use drugs.  If you are sexually active, practice safe sex.  Use a condom or other form of protection to prevent STIs (sexually transmitted infections).  Talk with your health care provider about taking a low-dose aspirin or statin.  Find healthy ways to cope with stress, such as: ? Meditation, yoga, or listening to music. ? Journaling. ? Talking to a trusted person. ? Spending time with friends and family. Safety  Always wear your seat belt while driving or riding in a vehicle.  Do not drive: ? If you have been drinking alcohol. Do not ride with someone who has been drinking. ? When you are tired or distracted. ? While texting.  Wear a helmet and other protective equipment during sports activities.  If you have firearms in your house, make sure you follow all gun safety procedures. What's next?  Visit your health care provider once a year for an annual wellness visit.  Ask your health care provider how often you should have your eyes and teeth checked.  Stay up to date on all vaccines. This information is not intended to replace advice given to you by your health care provider. Make sure you discuss any questions you have with your health care provider. Document Revised: 05/02/2019 Document Reviewed: 07/28/2018 Elsevier Patient Education  2021 Elsevier Inc.  

## 2020-10-02 NOTE — Progress Notes (Signed)
Subjective:   Joseph Collier is a 70 y.o. male who presents for Medicare Annual/Subsequent preventive examination.  Review of Systems    General:   No F/C, wt loss Pulm:   No DIB, SOB, pleuritic chest pain Card:  No CP, palpitations Abd:  No n/v/d or pain Ext:  No inc edema from baseline   Objective:    There were no vitals filed for this visit. There is no height or weight on file to calculate BMI.  Advanced Directives 01/20/2017 02/27/2016  Does Patient Have a Medical Advance Directive? No No  Would patient like information on creating a medical advance directive? - No - patient declined information    Current Medications (verified) Outpatient Encounter Medications as of 10/02/2020  Medication Sig  . aspirin EC 81 MG tablet Take 81 mg by mouth daily.  . dapagliflozin propanediol (FARXIGA) 10 MG TABS tablet Take 1 tablet (10 mg total) by mouth daily.  . Dulaglutide (TRULICITY) 1.5 TX/6.4WO SOPN INJECT 1.5MG  SUBCUTANEOUSLY ONCE A WEEK  . fluticasone (FLONASE) 50 MCG/ACT nasal spray Place 2 sprays into both nostrils daily.  Marland Kitchen glipiZIDE (GLUCOTROL) 10 MG tablet TAKE 1 TABLET BY MOUTH TWICE DAILY BEFORE  A  MEAL  . glucose blood test strip Use as instructed  . lisinopril (ZESTRIL) 40 MG tablet Take 1 tablet (40 mg total) by mouth daily.  . metFORMIN (GLUCOPHAGE) 1000 MG tablet 1 tablet in morning and one tablet in evening with meal.  . omeprazole (PRILOSEC) 20 MG capsule Take 1 capsule (20 mg total) by mouth daily.  . sildenafil (REVATIO) 20 MG tablet TAKE ONE TO THREE TABLETS BY MOUTH PRIOR TO INTERCOURSE AS NEEDED  . simvastatin (ZOCOR) 20 MG tablet TAKE 1 TABLET BY MOUTH AT BEDTIME   No facility-administered encounter medications on file as of 10/02/2020.    Allergies (verified) Penicillins, Saxagliptin, and Tetracyclines & related   History: Past Medical History:  Diagnosis Date  . Allergic rhinitis, cause unspecified   . Anemia, unspecified   . Arthritis   . Cancer  ([place]) 08/17/2006   Melanoma  . Essential hypertension, benign   . Impotence of organic origin 04/14/2007  . Obesity, unspecified   . Other and unspecified hyperlipidemia   . Personal history of colonic polyps   . Skin lesion 04/14/2007   melanoma   Past Surgical History:  Procedure Laterality Date  . APPENDECTOMY    . arthroscopy of knee  2008   Left Knee  . COLONOSCOPY  08/18/2007   normal. Joseph Collier.  Marland Kitchen HERNIA REPAIR     age 56  . JOINT REPLACEMENT    . melanoma of forearm resection  1992   Left        Joseph Collier  . TONSILLECTOMY AND ADENOIDECTOMY     Family History  Problem Relation Age of Onset  . Coronary artery disease Father   . Hypertension Father   . Heart disease Father        AMI cause of death  . Diabetes Father   . Cancer Mother 24       lung  . Hypertension Brother   . Diabetes Brother    Social History   Socioeconomic History  . Marital status: Married    Spouse name: Joseph Collier  . Number of children: 1  . Years of education: 13  . Highest education level: Not on file  Occupational History  . Occupation: outside Press photographer    Comment: engineered woods since 2000    Employer:  [employer]  Tobacco Use  . Smoking status: Former Smoker    Packs/day: 1.00    Years: 25.00    Pack years: 25.00    Types: Cigarettes    Quit date: 08/17/1989    Years since quitting: 31.1  . Smokeless tobacco: Never Used  . Tobacco comment: quit 1991  Vaping Use  . Vaping Use: Never used  Substance and Sexual Activity  . Alcohol use: Yes    Alcohol/week: 5.0 standard drinks    Types: 5 Cans of beer per week    Comment: moderate: drinks hard liquor 7 drinks per month in 08/2011/4 drinks 4 a week  . Drug use: No  . Sexual activity: Yes    Birth control/protection: Post-menopausal  Other Topics Concern  . Not on file  Social History Narrative   Marital status:  Married x 25 years, happily, second marriage.       Children:  1 son (67 yo.)  Son in [place], [place].       Lives with  wife.       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      Tobacco: none       Alcohol:    3 drinks per night every other night.       Drugs: none       Exercise: Light, walking, 3 times a week.        Caffeine use: carbonated beverages; consumes a minimal amount.        Seatbelt:  Always uses seat belts, no texting.       Guns:  Guns in the home stored in locked cabinet.        Home Safety:  Smoke Alarms in the home.        Education: [employer].   Social Determinants of Health   Financial Resource Strain: Not on file  Food Insecurity: Not on file  Transportation Needs: Not on file  Physical Activity: Not on file  Stress: Not on file  Social Connections: Not on file    Tobacco Counseling Counseling given: Not Answered Comment: quit 1991   Diabetic?Yes         Activities of Daily Living In your present state of health, do you have any difficulty performing the following activities: 10/02/2020 05/30/2020  Hearing? N N  Vision? N N  Difficulty concentrating or making decisions? N N  Walking or climbing stairs? N N  Dressing or bathing? N N  Doing errands, shopping? N N  Some recent data might be hidden    Patient Care Team: Joseph Reid, PA-C as PCP - General Gastroenterology, [place] any recent Medical Services you may have received from other than [facility] providers in the past year (date may be approximate).     Assessment:   This is a routine wellness examination for [name].  Hearing/Vision screen No exam data present  Dietary issues and exercise activities discussed:  -Recommend to follow a low carbohydrate and glucose diet. Follow a heart healthy diet. Stay as active as possible.  Goals   None    Depression Screen PHQ 2/9 Scores 10/02/2020 05/30/2020 01/26/2020 09/15/2019 06/16/2019 04/10/2019 03/14/2019  PHQ - 2 Score 0 0 0 0 0 0 0  PHQ- 9 Score 0 2 1 2 1  0 0    Fall Risk Fall Risk  10/02/2020  05/30/2020 06/16/2019 03/14/2019 05/09/2018  Falls in the past year? 0 0 0 0 No  Number falls  in past yr: - - 0 - -  Injury with Fall? - - 0 - -  Follow up Falls evaluation completed Falls evaluation completed - Falls evaluation completed -    FALL RISK PREVENTION PERTAINING TO THE HOME:  Any stairs in or around the home? Yes  If so, are there any without handrails? Yes  Home free of loose throw rugs in walkways, pet beds, electrical cords, etc? Yes  Adequate lighting in your home to reduce risk of falls? Yes   ASSISTIVE DEVICES UTILIZED TO PREVENT FALLS:  Life alert? No  Use of a cane, walker or w/c? No  Grab bars in the bathroom? Yes  Shower chair or bench in shower? No  Elevated toilet seat or a handicapped toilet? No   TIMED UP AND GO:  Was the test performed? Yes .  Length of time to ambulate 10 feet: 10 sec.   Gait steady and fast without use of assistive device  Cognitive Function: wnl  6CIT Screen 10/02/2020 06/16/2019  What Year? 0 points 0 points  What month? 0 points 0 points  What time? 0 points 0 points  Count back from 20 0 points 0 points  Months in reverse 0 points 0 points  Repeat phrase 0 points 2 points  Total Score 0 2    Immunizations Immunization History  Administered Date(s) Administered  . Fluad Quad(high Dose 65+) 05/26/2019  . Influenza Split 07/18/2007, 04/25/2012  . Influenza, High Dose Seasonal PF 08/08/2018  . Influenza,inj,Quad PF,6+ Mos 05/01/2013, 05/09/2014, 05/24/2015, 06/18/2017  . PFIZER(Purple Top)SARS-COV-2 Vaccination 10/01/2019, 10/24/2019  . Pneumococcal Conjugate-13 06/16/2019  . Pneumococcal Polysaccharide-23 05/24/2015  . Pneumococcal-Unspecified 08/17/2006  . Tdap 09/15/2011, 01/09/2019  . Zoster Recombinat (Shingrix) 10/07/2018, 02/14/2019    TDAP status: Up to date  Flu Vaccine status: Due, Education has been provided regarding the importance of this vaccine. Advised may receive this vaccine at local pharmacy or  Health Dept. Aware to provide a copy of the vaccination record if obtained from local pharmacy or Health Dept. Verbalized acceptance and understanding.  Pneumococcal vaccine status: Up to date  Covid-19 vaccine status: Completed vaccines  Qualifies for Shingles Vaccine? Yes   Zostavax completed Yes   Shingrix Completed?: Yes  Screening Tests Health Maintenance  Topic Date Due  . OPHTHALMOLOGY EXAM  05/05/2017  . COLONOSCOPY (Pts 45-50yrs Insurance coverage will need to be confirmed)  01/24/2018  . COVID-19 Vaccine (3 - Booster for Pfizer series) 04/25/2020  . FOOT EXAM  06/15/2020  . PNA vac Low Risk Adult (2 of 2 - PPSV23) 06/15/2020  . Hepatitis C Screening  04/22/2029 (Originally February 06, 1951)  . HEMOGLOBIN A1C  03/27/2021  . TETANUS/TDAP  01/08/2029  . INFLUENZA VACCINE  Completed    Health Maintenance  Health Maintenance Due  Topic Date Due  . OPHTHALMOLOGY EXAM  05/05/2017  . COLONOSCOPY (Pts 45-42yrs Insurance coverage will need to be confirmed)  01/24/2018  . COVID-19 Vaccine (3 - Booster for Pfizer series) 04/25/2020  . FOOT EXAM  06/15/2020  . PNA vac Low Risk Adult (2 of 2 - PPSV23) 06/15/2020    Colorectal cancer screening: Referral to GI placed today. Pt aware the office will call re: appt.  Lung Cancer Screening: (Low Dose CT Chest recommended if Age 55-80 years, 30 pack-year currently smoking OR have quit w/in 15years.) does not qualify.   Lung Cancer Screening Referral:   Additional Screening:  Hepatitis C Screening: does qualify; Completed 06/16/2019  Vision Screening: Recommended annual ophthalmology exams for  early detection of glaucoma and other disorders of the eye. Is the patient up to date with their annual eye exam?  Yes  Who is the provider or what is the name of the office in which the patient attends annual eye exams?  If pt is not established with a provider, would they like to be referred to a provider to establish care? No .   Dental  Screening: Recommended annual dental exams for proper oral hygiene  Community Resource Referral / Chronic Care Management: CRR required this visit?  No   CCM required this visit?  No      Plan:  -BP elevated today, BP recheck mildly improved. Advised to check BP at home for the next 2 weeks and keep a log to forward to the clinic. If BP consistently >140/90 then will make treatment adjustments. Patient verbalized understanding. Follow low sodium diet. -Discussed most recent labs which are essentially within normal limits or stable from prior with the exception of mildly elevated A1c. -Follow-up in 3 months for DM, HTN, HLD  I have personally reviewed and noted the following in the patient's chart:   . Medical and social history . Use of alcohol, tobacco or illicit drugs  . Current medications and supplements . Functional ability and status . Nutritional status . Physical activity . Advanced directives . List of other physicians . Hospitalizations, surgeries, and ER visits in previous 12 months . Vitals . Screenings to include cognitive, depression, and falls . Referrals and appointments  In addition, I have reviewed and discussed with patient certain preventive protocols, quality metrics, and best practice recommendations. A written personalized care plan for preventive services as well as general preventive health recommendations were provided to patient.

## 2020-10-04 ENCOUNTER — Other Ambulatory Visit: Payer: Self-pay | Admitting: Physician Assistant

## 2020-10-04 DIAGNOSIS — E1169 Type 2 diabetes mellitus with other specified complication: Secondary | ICD-10-CM

## 2020-10-04 DIAGNOSIS — E785 Hyperlipidemia, unspecified: Secondary | ICD-10-CM

## 2020-10-06 MED ORDER — TRULICITY 1.5 MG/0.5ML ~~LOC~~ SOAJ: SUBCUTANEOUS | 0 refills | Status: DC

## 2020-10-06 MED ORDER — SIMVASTATIN 20 MG PO TABS: 20.0000 mg | ORAL_TABLET | Freq: Every day | ORAL | 0 refills | Status: DC

## 2020-10-06 MED ORDER — GLIPIZIDE 10 MG PO TABS: 10.0000 mg | ORAL_TABLET | Freq: Two times a day (BID) | ORAL | 0 refills | Status: DC

## 2020-11-01 ENCOUNTER — Other Ambulatory Visit: Payer: Self-pay | Admitting: Physician Assistant

## 2020-11-01 DIAGNOSIS — E1159 Type 2 diabetes mellitus with other circulatory complications: Secondary | ICD-10-CM

## 2020-11-04 MED ORDER — LISINOPRIL 40 MG PO TABS: 40.0000 mg | ORAL_TABLET | Freq: Every day | ORAL | 0 refills | Status: DC

## 2020-11-08 ENCOUNTER — Other Ambulatory Visit: Payer: Self-pay | Admitting: Physician Assistant

## 2020-11-08 DIAGNOSIS — E1169 Type 2 diabetes mellitus with other specified complication: Secondary | ICD-10-CM

## 2020-12-13 DIAGNOSIS — L308 Other specified dermatitis: Secondary | ICD-10-CM | POA: Diagnosis not present

## 2020-12-13 DIAGNOSIS — D225 Melanocytic nevi of trunk: Secondary | ICD-10-CM | POA: Diagnosis not present

## 2020-12-17 ENCOUNTER — Encounter: Payer: Self-pay | Admitting: Physician Assistant

## 2020-12-31 ENCOUNTER — Other Ambulatory Visit: Payer: Self-pay

## 2020-12-31 ENCOUNTER — Encounter: Payer: Self-pay | Admitting: Physician Assistant

## 2020-12-31 ENCOUNTER — Ambulatory Visit (INDEPENDENT_AMBULATORY_CARE_PROVIDER_SITE_OTHER): Payer: Medicare Other | Admitting: Physician Assistant

## 2020-12-31 VITALS — BP 138/77 | HR 84 | Temp 97.8°F | Ht 69.0 in | Wt 242.9 lb

## 2020-12-31 DIAGNOSIS — I152 Hypertension secondary to endocrine disorders: Secondary | ICD-10-CM | POA: Diagnosis not present

## 2020-12-31 DIAGNOSIS — E785 Hyperlipidemia, unspecified: Secondary | ICD-10-CM | POA: Diagnosis not present

## 2020-12-31 DIAGNOSIS — E1159 Type 2 diabetes mellitus with other circulatory complications: Secondary | ICD-10-CM | POA: Diagnosis not present

## 2020-12-31 DIAGNOSIS — Z794 Long term (current) use of insulin: Secondary | ICD-10-CM

## 2020-12-31 DIAGNOSIS — E1169 Type 2 diabetes mellitus with other specified complication: Secondary | ICD-10-CM

## 2020-12-31 LAB — POCT GLYCOSYLATED HEMOGLOBIN (HGB A1C): Hemoglobin A1C: 7 % — AB (ref 4.0–5.6)

## 2020-12-31 MED ORDER — TRULICITY 3 MG/0.5ML ~~LOC~~ SOAJ: 3.0000 mg | SUBCUTANEOUS | 0 refills | Status: DC

## 2020-12-31 NOTE — Patient Instructions (Signed)
Diabetes Mellitus and Nutrition, Adult When you have diabetes, or diabetes mellitus, it is very important to have healthy eating habits because your blood sugar (glucose) levels are greatly affected by what you eat and drink. Eating healthy foods in the right amounts, at about the same times every day, can help you:  Control your blood glucose.  Lower your risk of heart disease.  Improve your blood pressure.  Reach or maintain a healthy weight. What can affect my meal plan? Every person with diabetes is different, and each person has different needs for a meal plan. Your health care provider may recommend that you work with a dietitian to make a meal plan that is best for you. Your meal plan may vary depending on factors such as:  The calories you need.  The medicines you take.  Your weight.  Your blood glucose, blood pressure, and cholesterol levels.  Your activity level.  Other health conditions you have, such as heart or kidney disease. How do carbohydrates affect me? Carbohydrates, also called carbs, affect your blood glucose level more than any other type of food. Eating carbs naturally raises the amount of glucose in your blood. Carb counting is a method for keeping track of how many carbs you eat. Counting carbs is important to keep your blood glucose at a healthy level, especially if you use insulin or take certain oral diabetes medicines. It is important to know how many carbs you can safely have in each meal. This is different for every person. Your dietitian can help you calculate how many carbs you should have at each meal and for each snack. How does alcohol affect me? Alcohol can cause a sudden decrease in blood glucose (hypoglycemia), especially if you use insulin or take certain oral diabetes medicines. Hypoglycemia can be a life-threatening condition. Symptoms of hypoglycemia, such as sleepiness, dizziness, and confusion, are similar to symptoms of having too much  alcohol.  Do not drink alcohol if: ? Your health care provider tells you not to drink. ? You are pregnant, may be pregnant, or are planning to become pregnant.  If you drink alcohol: ? Do not drink on an empty stomach. ? Limit how much you use to:  0-1 drink a day for women.  0-2 drinks a day for men. ? Be aware of how much alcohol is in your drink. In the [place] one drink equals one 12 oz bottle of beer (355 mL), one 5 oz glass of wine (148 mL), or one 1 oz glass of hard liquor (44 mL). ? Keep yourself hydrated with water, diet soda, or unsweetened iced tea.  Keep in mind that regular soda, juice, and other mixers may contain a lot of sugar and must be counted as carbs. What are tips for following this plan? Reading food labels  Start by checking the serving size on the "Nutrition Facts" label of packaged foods and drinks. The amount of calories, carbs, fats, and other nutrients listed on the label is based on one serving of the item. Many items contain more than one serving per package.  Check the total grams (g) of carbs in one serving. You can calculate the number of servings of carbs in one serving by dividing the total carbs by 15. For example, if a food has 30 g of total carbs per serving, it would be equal to 2 servings of carbs.  Check the number of grams (g) of saturated fats and trans fats in one serving. Choose foods that have   a low amount or none of these fats.  Check the number of milligrams (mg) of salt (sodium) in one serving. Most people should limit total sodium intake to less than 2,300 mg per day.  Always check the nutrition information of foods labeled as "low-fat" or "nonfat." These foods may be higher in added sugar or refined carbs and should be avoided.  Talk to your dietitian to identify your daily goals for nutrients listed on the label. Shopping  Avoid buying canned, pre-made, or processed foods. These foods tend to be high in fat, sodium, and added  sugar.  Shop around the outside edge of the grocery store. This is where you will most often find fresh fruits and vegetables, bulk grains, fresh meats, and fresh dairy. Cooking  Use low-heat cooking methods, such as baking, instead of high-heat cooking methods like deep frying.  Cook using healthy oils, such as olive, canola, or sunflower oil.  Avoid cooking with butter, cream, or high-fat meats. Meal planning  Eat meals and snacks regularly, preferably at the same times every day. Avoid going long periods of time without eating.  Eat foods that are high in fiber, such as fresh fruits, vegetables, beans, and whole grains. Talk with your dietitian about how many servings of carbs you can eat at each meal.  Eat 4-6 oz (112-168 g) of lean protein each day, such as lean meat, chicken, fish, eggs, or tofu. One ounce (oz) of lean protein is equal to: ? 1 oz (28 g) of meat, chicken, or fish. ? 1 egg. ?  cup (62 g) of tofu.  Eat some foods each day that contain healthy fats, such as avocado, nuts, seeds, and fish.   What foods should I eat? Fruits Berries. Apples. Oranges. Peaches. Apricots. Plums. Grapes. Mango. Papaya. Pomegranate. Kiwi. Cherries. Vegetables Lettuce. Spinach. Leafy greens, including kale, chard, collard greens, and mustard greens. Beets. Cauliflower. Cabbage. Broccoli. Carrots. Green beans. Tomatoes. Peppers. Onions. Cucumbers. Brussels sprouts. Grains Whole grains, such as whole-wheat or whole-grain bread, crackers, tortillas, cereal, and pasta. Unsweetened oatmeal. Quinoa. Brown or wild rice. Meats and other proteins Seafood. Poultry without skin. Lean cuts of poultry and beef. Tofu. Nuts. Seeds. Dairy Low-fat or fat-free dairy products such as milk, yogurt, and cheese. The items listed above may not be a complete list of foods and beverages you can eat. Contact a dietitian for more information. What foods should I avoid? Fruits Fruits canned with  syrup. Vegetables Canned vegetables. Frozen vegetables with butter or cream sauce. Grains Refined white flour and flour products such as bread, pasta, snack foods, and cereals. Avoid all processed foods. Meats and other proteins Fatty cuts of meat. Poultry with skin. Breaded or fried meats. Processed meat. Avoid saturated fats. Dairy Full-fat yogurt, cheese, or milk. Beverages Sweetened drinks, such as soda or iced tea. The items listed above may not be a complete list of foods and beverages you should avoid. Contact a dietitian for more information. Questions to ask a health care provider  Do I need to meet with a diabetes educator?  Do I need to meet with a dietitian?  What number can I call if I have questions?  When are the best times to check my blood glucose? Where to find more information:  American Diabetes Association: diabetes.org  Academy of Nutrition and Dietetics: www.eatright.org  National Institute of Diabetes and Digestive and Kidney Diseases: www.niddk.nih.gov  Association of Diabetes Care and Education Specialists: www.diabeteseducator.org Summary  It is important to have healthy eating   habits because your blood sugar (glucose) levels are greatly affected by what you eat and drink.  A healthy meal plan will help you control your blood glucose and maintain a healthy lifestyle.  Your health care provider may recommend that you work with a dietitian to make a meal plan that is best for you.  Keep in mind that carbohydrates (carbs) and alcohol have immediate effects on your blood glucose levels. It is important to count carbs and to use alcohol carefully. This information is not intended to replace advice given to you by your health care provider. Make sure you discuss any questions you have with your health care provider. Document Revised: 07/11/2019 Document Reviewed: 07/11/2019 Elsevier Patient Education  2021 Elsevier Inc.  

## 2020-12-31 NOTE — Assessment & Plan Note (Signed)
-  Improved and fairly controlled. -Continue current medication regimen. -Will continue to monitor.  Plan to repeat CMP for medication monitoring at follow-up visit.

## 2020-12-31 NOTE — Assessment & Plan Note (Signed)
-   Last lipid panel: Total cholesterol 107, triglycerides 74, HDL 37, LDL 55 (at goal<70). -Continue current medication regimen. -Encouraged to continue with dietary changes and follow heart healthy diet.  Continue to stay as active as possible. -Will continue to monitor.  Plan to repeat lipid panel and hepatic function at follow-up visit.

## 2020-12-31 NOTE — Assessment & Plan Note (Signed)
-   A1c has increased from 6.8 to 7.0.  Discussed with patient medication adjustments and is agreeable to increase Trulicity to 3 mg once a week.  Continue metformin, glipizide, Farxiga. -Encouraged to reduce carbohydrates and monitor sugar intake. -Continue ambulatory glucose monitoring. Recommend to avoid skipping meals to reduce risk of hypoglycemia.  -Follow up in 3 months.

## 2020-12-31 NOTE — Progress Notes (Signed)
Established Patient Office Visit  Subjective:  Patient ID: Joseph Collier, male    DOB: Feb 03, 1951  Age: 70 y.o. MRN: [id number]  CC:  Chief Complaint  Patient presents with  . Follow-up  . Hypertension  . Hyperlipidemia  . Diabetes    HPI Joseph Collier presents for follow up on hypertension, hyperlipidemia and diabetes mellitus. Has no acute concerns today.  HTN: Pt denies chest pain, palpitations, dizziness, shortness of breath, or lower extremity swelling. Taking medication as directed without side effects. Checks BP at home and readings range 130s/70s. Pt follows a low salt diet and reports tries to stay hydrated.  HLD: Pt taking medication as directed without issues. Denies side effects including myalgias and RUQ pain. States has reduced fried foods and dairy. Is drinking skim milk and oat milk.  Diabetes: Pt denies increased urination or thirst. Pt reports medication compliance. Does report hypoglycemic events. Checking glucose at home. States FBS fluctuate and have been <170 with lowest readings in the 80s. States sometimes will skip a meal especially when he is outside working. Once he eats or has a snack he feels better. Denies syncope.    Past Medical History:  Diagnosis Date  . Allergic rhinitis, cause unspecified   . Anemia, unspecified   . Arthritis   . Cancer ([place]) 08/17/2006   Melanoma  . Essential hypertension, benign   . Impotence of organic origin 04/14/2007  . Obesity, unspecified   . Other and unspecified hyperlipidemia   . Personal history of colonic polyps   . Skin lesion 04/14/2007   melanoma    Past Surgical History:  Procedure Laterality Date  . APPENDECTOMY    . arthroscopy of knee  2008   Left Knee  . COLONOSCOPY  08/18/2007   normal. [name].  Marland Kitchen HERNIA REPAIR     age 30  . JOINT REPLACEMENT    . melanoma of forearm resection  1992   Left        [name]  . TONSILLECTOMY AND ADENOIDECTOMY      Family History  Problem Relation Age of Onset   . Coronary artery disease Father   . Hypertension Father   . Heart disease Father        AMI cause of death  . Diabetes Father   . Cancer Mother 64       lung  . Hypertension Brother   . Diabetes Brother     Social History   Socioeconomic History  . Marital status: Married    Spouse name: [name]  . Number of children: 1  . Years of education: 28  . Highest education level: Not on file  Occupational History  . Occupation: outside Press photographer    Comment: engineered woods since 2000    Employer: [employer]  Tobacco Use  . Smoking status: Former Smoker    Packs/day: 1.00    Years: 25.00    Pack years: 25.00    Types: Cigarettes    Quit date: 08/17/1989    Years since quitting: 31.3  . Smokeless tobacco: Never Used  . Tobacco comment: quit 1991  Vaping Use  . Vaping Use: Never used  Substance and Sexual Activity  . Alcohol use: Yes    Alcohol/week: 5.0 standard drinks    Types: 5 Cans of beer per week    Comment: moderate: drinks hard liquor 7 drinks per month in 08/2011/4 drinks 4 a week  . Drug use: No  . Sexual activity: Yes  Birth control/protection: Post-menopausal  Other Topics Concern  . Not on file  Social History Narrative   Marital status:  Married x 25 years, happily, second marriage.       Children:  1 son (74 yo.)  Son in [place], [place].       Lives with wife.       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      Tobacco: none       Alcohol:    3 drinks per night every other night.       Drugs: none       Exercise: Light, walking, 3 times a week.        Caffeine use: carbonated beverages; consumes a minimal amount.        Seatbelt:  Always uses seat belts, no texting.       Guns:  Guns in the home stored in locked cabinet.        Home Safety:  Smoke Alarms in the home.        Education: [employer].   Social Determinants of Health   Financial Resource Strain: Not on file  Food Insecurity: Not on  file  Transportation Needs: Not on file  Physical Activity: Not on file  Stress: Not on file  Social Connections: Not on file  Intimate Partner Violence: Not on file    Outpatient Medications Prior to Visit  Medication Sig Dispense Refill  . aspirin EC 81 MG tablet Take 81 mg by mouth daily.    Marland Kitchen FARXIGA 10 MG TABS tablet Take 1 tablet by mouth once daily 90 tablet 0  . fluticasone (FLONASE) 50 MCG/ACT nasal spray Place 2 sprays into both nostrils daily.    Marland Kitchen glipiZIDE (GLUCOTROL) 10 MG tablet Take 1 tablet (10 mg total) by mouth 2 (two) times daily before a meal. 180 tablet 0  . glucose blood test strip Use as instructed 100 each 12  . lisinopril (ZESTRIL) 40 MG tablet Take 1 tablet (40 mg total) by mouth daily. 90 tablet 0  . metFORMIN (GLUCOPHAGE) 1000 MG tablet TAKE 1 TABLET BY MOUTH IN THE MORNING AND 1 IN THE EVENING WITH MEALS 180 tablet 0  . omeprazole (PRILOSEC) 20 MG capsule Take 1 capsule (20 mg total) by mouth daily. 90 capsule 1  . sildenafil (REVATIO) 20 MG tablet TAKE ONE TO THREE TABLETS BY MOUTH PRIOR TO INTERCOURSE AS NEEDED 30 tablet 0  . simvastatin (ZOCOR) 20 MG tablet Take 1 tablet (20 mg total) by mouth at bedtime. 90 tablet 0  . Dulaglutide (TRULICITY) 1.5 ZH/0.8MV SOPN INJECT 1.5MG  SUBCUTANEOUSLY ONCE A WEEK 6 mL 0   No facility-administered medications prior to visit.    Allergies  Allergen Reactions  . Penicillins Hives and Swelling  . Saxagliptin   . Tetracyclines & Related Hives and Swelling    ROS Review of Systems A fourteen system review of systems was performed and found to be positive as per HPI.    Objective:    Physical Exam General:  Well Developed, well nourished, in no acute distress  Neuro:  Alert and oriented,  extra-ocular muscles intact  HEENT:  Normocephalic, atraumatic, neck supple Skin:  no gross rash, warm, pink. Cardiac:  RRR, S1 S2 wnl's, no murmur  Respiratory:  ECTA B/L w/o wheezing, Not using accessory muscles, speaking  in full sentences- unlabored. Vascular:  Ext warm, no cyanosis apprec.; cap RF less 2 sec. No edema Psych:  No  HI/SI, judgement and insight good, Euthymic mood. Full Affect.   BP 138/77   Pulse 84   Temp 97.8 F (36.6 C)   Ht 5\' 9"  (1.753 m)   Wt 242 lb 14.4 oz (110.2 kg)   SpO2 99%   BMI 35.87 kg/m  Wt Readings from Last 3 Encounters:  12/31/20 242 lb 14.4 oz (110.2 kg)  10/02/20 249 lb 14.4 oz (113.4 kg)  05/30/20 248 lb 12.8 oz (112.9 kg)     Health Maintenance Due  Topic Date Due  . COLONOSCOPY (Pts 45-55yrs Insurance coverage will need to be confirmed)  01/24/2018  . COVID-19 Vaccine (3 - Booster for Pfizer series) 03/25/2020  . PNA vac Low Risk Adult (2 of 2 - PPSV23) 06/15/2020    There are no preventive care reminders to display for this patient.  Lab Results  Component Value Date   TSH 1.570 09/27/2020   Lab Results  Component Value Date   WBC 8.6 09/27/2020   HGB 15.1 09/27/2020   HCT 45.6 09/27/2020   MCV 89 09/27/2020   PLT 305 09/27/2020   Lab Results  Component Value Date   NA 133 (L) 09/27/2020   K 4.7 09/27/2020   CO2 22 09/27/2020   GLUCOSE 151 (H) 09/27/2020   BUN 14 09/27/2020   CREATININE 1.01 09/27/2020   BILITOT 0.7 09/27/2020   ALKPHOS 45 09/27/2020   AST 24 09/27/2020   ALT 38 09/27/2020   PROT 6.7 09/27/2020   ALBUMIN 4.7 09/27/2020   CALCIUM 9.4 09/27/2020   Lab Results  Component Value Date   CHOL 107 09/27/2020   Lab Results  Component Value Date   HDL 37 (L) 09/27/2020   Lab Results  Component Value Date   LDLCALC 55 09/27/2020   Lab Results  Component Value Date   TRIG 74 09/27/2020   Lab Results  Component Value Date   CHOLHDL 2.9 09/27/2020   Lab Results  Component Value Date   HGBA1C 7.0 (A) 12/31/2020      Assessment & Plan:   Problem List Items Addressed This Visit      Cardiovascular and Mediastinum   Hypertension associated with diabetes ([place]) - Primary (Chronic)    -Improved and fairly  controlled. -Continue current medication regimen. -Will continue to monitor.  Plan to repeat CMP for medication monitoring at follow-up visit.      Relevant Medications   Dulaglutide (TRULICITY) 3 MA/2.6JF SOPN   Other Relevant Orders   POCT HgB A1C (Completed)     Endocrine   Hyperlipidemia associated with type 2 diabetes mellitus (HCC) (Chronic)    - Last lipid panel: Total cholesterol 107, triglycerides 74, HDL 37, LDL 55 (at goal<70). -Continue current medication regimen. -Encouraged to continue with dietary changes and follow heart healthy diet.  Continue to stay as active as possible. -Will continue to monitor.  Plan to repeat lipid panel and hepatic function at follow-up visit.      Relevant Medications   Dulaglutide (TRULICITY) 3 HL/4.5GY SOPN   Other Relevant Orders   POCT HgB A1C (Completed)   Type 2 diabetes mellitus with other specified complication (HCC)    - B6L has increased from 6.8 to 7.0.  Discussed with patient medication adjustments and is agreeable to increase Trulicity to 3 mg once a week.  Continue metformin, glipizide, Farxiga. -Encouraged to reduce carbohydrates and monitor sugar intake. -Continue ambulatory glucose monitoring. Recommend to avoid skipping meals to reduce risk of hypoglycemia.  -Follow up in 3 months.  Relevant Medications   Dulaglutide (TRULICITY) 3 0000000 SOPN   Other Relevant Orders   POCT HgB A1C (Completed)      Meds ordered this encounter  Medications  . Dulaglutide (TRULICITY) 3 0000000 SOPN    Sig: Inject 3 mg as directed once a week.    Dispense:  6 mL    Refill:  0    Order Specific Question:   Supervising Provider    Answer:   Beatrice Lecher D [[id number]]    Follow-up: Return in about 3 months (around 04/02/2021) for DM- inc med, HTN, HLD and FBW (cmp, lipid profile, A1c) few days prior.    Lorrene Reid, PA-C

## 2021-01-03 ENCOUNTER — Other Ambulatory Visit: Payer: Self-pay | Admitting: Physician Assistant

## 2021-01-03 DIAGNOSIS — E1169 Type 2 diabetes mellitus with other specified complication: Secondary | ICD-10-CM

## 2021-01-30 ENCOUNTER — Telehealth: Payer: Self-pay | Admitting: Physician Assistant

## 2021-01-30 ENCOUNTER — Ambulatory Visit (INDEPENDENT_AMBULATORY_CARE_PROVIDER_SITE_OTHER): Payer: Medicare Other | Admitting: Nurse Practitioner

## 2021-01-30 ENCOUNTER — Encounter: Payer: Self-pay | Admitting: Nurse Practitioner

## 2021-01-30 ENCOUNTER — Other Ambulatory Visit: Payer: Self-pay

## 2021-01-30 VITALS — Ht 69.0 in | Wt 242.0 lb

## 2021-01-30 DIAGNOSIS — E1159 Type 2 diabetes mellitus with other circulatory complications: Secondary | ICD-10-CM

## 2021-01-30 DIAGNOSIS — U071 COVID-19: Secondary | ICD-10-CM

## 2021-01-30 DIAGNOSIS — J988 Other specified respiratory disorders: Secondary | ICD-10-CM | POA: Diagnosis not present

## 2021-01-30 DIAGNOSIS — I152 Hypertension secondary to endocrine disorders: Secondary | ICD-10-CM

## 2021-01-30 DIAGNOSIS — E1169 Type 2 diabetes mellitus with other specified complication: Secondary | ICD-10-CM | POA: Diagnosis not present

## 2021-01-30 MED ORDER — NIRMATRELVIR/RITONAVIR (PAXLOVID) TABLET (RENAL DOSING): 2.0000 | ORAL_TABLET | Freq: Two times a day (BID) | ORAL | 0 refills | Status: AC

## 2021-01-30 NOTE — Progress Notes (Signed)
Virtual Visit via Telephone Note  I connected with Joseph Collier on 02/09/21 at 10:50 AM EDT by telephone and verified that I am speaking with the correct person using two identifiers.  Location: Patient: home Provider: [facility] at [facility]     I discussed the limitations, risks, security and privacy concerns of performing an evaluation and management service by telephone and the availability of in person appointments. I also discussed with the patient that there may be a patient responsible charge related to this service. The patient expressed understanding and agreed to proceed.   History of Present Illness: The patient states that he has had headache and scratchy throat which started on Sunday. He states that last night he had a fever of 102.4. he states that he is fully vaccinated for COVID 19 and had both boosters. He has taken some OTC medications to treat symptoms which has helped for short periods of time, but they wear off quickly. He feels fatigued. He has history of diabetes, HTN, and obesity which make him a higher risk for complications related to COVID 19. He denies nausea or vomiting. Has diminished senses of taste and smell.    Observations/Objective: The patient is alert and oriented. He is pleasant and answering all questions appropriately. Breathing is non-labored. He is in no acute distress.  He is nasally congested and sounds as though he does not feel well.   Today's Vitals   01/30/21 1019  Weight: 242 lb (109.8 kg)  Height: '5\' 9"'  (1.753 m)   Body mass index is 35.74 kg/m.   Assessment and Plan: 1. Respiratory tract infection due to COVID-19 virus Patient is positive for COVID 19 and symptoms started within past 5 days. Start treatment with Paxlovid, taking three tablets twice daily for 5 days. Reviewed renal functions prior to sending prescription and the eGFR is 77. Advised he rest and increase fluids. Reviewed quarantine procedures for those  positive and vaccinated for COVID 19. He voiced understanding and agreement with the instructions.  - nirmatrelvir/ritonavir EUA, renal dosing, (PAXLOVID) TABS; Take 2 tablets by mouth 2 (two) times daily for 5 days. (Take nirmatrelvir 150 mg one tablet twice daily for 5 days and ritonavir 100 mg one tablet twice daily for 5 days) Patient GFR is 77  Dispense: 20 tablet; Refill: 0  2. Type 2 diabetes mellitus with other specified complication, without long-term current use of insulin ([place]) Continue diabetic medication as prescribed   3. Hypertension associated with diabetes ([place]) Continue BP medication as prescribed   Follow Up Instructions:    I discussed the assessment and treatment plan with the patient. The patient was provided an opportunity to ask questions and all were answered. The patient agreed with the plan and demonstrated an understanding of the instructions.   The patient was advised to call back or seek an in-person evaluation if the symptoms worsen or if the condition fails to improve as anticipated.  I provided 15 minutes of non-face-to-face time during this encounter.   Ronnell Freshwater, NP

## 2021-01-30 NOTE — Telephone Encounter (Signed)
Pharmacy questioning dosage for Paxlovid stating normally given 3 po BID x 5 days for GFR higher than 60 and is requesting medication dosing instructions. Spoke with Nira Conn who agreed to increase the dose of the Paxlovid to 3 po bid. AS, CMA

## 2021-01-30 NOTE — Telephone Encounter (Signed)
error 

## 2021-01-30 NOTE — Telephone Encounter (Signed)
Patient's pharmacy called asking for instructions for Paxlovid. They would like instructions and directions for the quantity. Please advise, thanks.

## 2021-01-30 NOTE — Telephone Encounter (Signed)
Pharmacy made aware that ok for paxlovid - 3 tablets bid

## 2021-02-02 ENCOUNTER — Other Ambulatory Visit: Payer: Self-pay | Admitting: Physician Assistant

## 2021-02-02 DIAGNOSIS — E1159 Type 2 diabetes mellitus with other circulatory complications: Secondary | ICD-10-CM

## 2021-02-02 DIAGNOSIS — K219 Gastro-esophageal reflux disease without esophagitis: Secondary | ICD-10-CM

## 2021-02-02 DIAGNOSIS — E1169 Type 2 diabetes mellitus with other specified complication: Secondary | ICD-10-CM

## 2021-02-04 ENCOUNTER — Other Ambulatory Visit: Payer: Self-pay | Admitting: Physician Assistant

## 2021-02-09 DIAGNOSIS — J988 Other specified respiratory disorders: Secondary | ICD-10-CM | POA: Insufficient documentation

## 2021-03-22 ENCOUNTER — Other Ambulatory Visit: Payer: Self-pay | Admitting: Physician Assistant

## 2021-03-22 DIAGNOSIS — Z794 Long term (current) use of insulin: Secondary | ICD-10-CM

## 2021-03-22 DIAGNOSIS — E1169 Type 2 diabetes mellitus with other specified complication: Secondary | ICD-10-CM

## 2021-03-24 MED ORDER — GLIPIZIDE 10 MG PO TABS: 10.0000 mg | ORAL_TABLET | Freq: Two times a day (BID) | ORAL | 0 refills | Status: DC

## 2021-03-24 MED ORDER — TRULICITY 3 MG/0.5ML ~~LOC~~ SOAJ: 3.0000 mg | SUBCUTANEOUS | 0 refills | Status: DC

## 2021-03-27 ENCOUNTER — Other Ambulatory Visit: Payer: Self-pay | Admitting: Physician Assistant

## 2021-03-27 DIAGNOSIS — E785 Hyperlipidemia, unspecified: Secondary | ICD-10-CM

## 2021-03-27 DIAGNOSIS — E1169 Type 2 diabetes mellitus with other specified complication: Secondary | ICD-10-CM

## 2021-03-27 DIAGNOSIS — E1159 Type 2 diabetes mellitus with other circulatory complications: Secondary | ICD-10-CM

## 2021-03-28 ENCOUNTER — Other Ambulatory Visit: Payer: Self-pay

## 2021-03-28 ENCOUNTER — Other Ambulatory Visit: Payer: Medicare Other

## 2021-03-28 DIAGNOSIS — E785 Hyperlipidemia, unspecified: Secondary | ICD-10-CM | POA: Diagnosis not present

## 2021-03-28 DIAGNOSIS — E1169 Type 2 diabetes mellitus with other specified complication: Secondary | ICD-10-CM | POA: Diagnosis not present

## 2021-03-28 DIAGNOSIS — I152 Hypertension secondary to endocrine disorders: Secondary | ICD-10-CM | POA: Diagnosis not present

## 2021-03-28 DIAGNOSIS — E1159 Type 2 diabetes mellitus with other circulatory complications: Secondary | ICD-10-CM | POA: Diagnosis not present

## 2021-03-29 LAB — COMPREHENSIVE METABOLIC PANEL
ALT: 36 IU/L (ref 0–44)
AST: 23 IU/L (ref 0–40)
Albumin/Globulin Ratio: 2.3 — ABNORMAL HIGH (ref 1.2–2.2)
Albumin: 4.9 g/dL — ABNORMAL HIGH (ref 3.8–4.8)
Alkaline Phosphatase: 48 IU/L (ref 44–121)
BUN/Creatinine Ratio: 14 (ref 10–24)
BUN: 16 mg/dL (ref 8–27)
Bilirubin Total: 0.5 mg/dL (ref 0.0–1.2)
CO2: 25 mmol/L (ref 20–29)
Calcium: 10.3 mg/dL — ABNORMAL HIGH (ref 8.6–10.2)
Chloride: 101 mmol/L (ref 96–106)
Creatinine, Ser: 1.11 mg/dL (ref 0.76–1.27)
Globulin, Total: 2.1 g/dL (ref 1.5–4.5)
Glucose: 114 mg/dL — ABNORMAL HIGH (ref 65–99)
Potassium: 6 mmol/L — ABNORMAL HIGH (ref 3.5–5.2)
Sodium: 140 mmol/L (ref 134–144)
Total Protein: 7 g/dL (ref 6.0–8.5)
eGFR: 72 mL/min/{1.73_m2} (ref >59)

## 2021-03-29 LAB — LIPID PANEL
Chol/HDL Ratio: 3 ratio (ref 0.0–5.0)
Cholesterol, Total: 98 mg/dL — ABNORMAL LOW (ref 100–199)
HDL: 33 mg/dL — ABNORMAL LOW (ref >39)
LDL Chol Calc (NIH): 49 mg/dL (ref 0–99)
Triglycerides: 74 mg/dL (ref 0–149)
VLDL Cholesterol Cal: 16 mg/dL (ref 5–40)

## 2021-03-29 LAB — HEMOGLOBIN A1C
Est. average glucose Bld gHb Est-mCnc: 140 mg/dL
Hgb A1c MFr Bld: 6.5 % — ABNORMAL HIGH (ref 4.8–5.6)

## 2021-03-31 ENCOUNTER — Other Ambulatory Visit: Payer: Medicare Other

## 2021-04-03 ENCOUNTER — Encounter: Payer: Self-pay | Admitting: Physician Assistant

## 2021-04-03 ENCOUNTER — Other Ambulatory Visit: Payer: Self-pay | Admitting: Physician Assistant

## 2021-04-03 ENCOUNTER — Ambulatory Visit (INDEPENDENT_AMBULATORY_CARE_PROVIDER_SITE_OTHER): Payer: Medicare Other | Admitting: Physician Assistant

## 2021-04-03 ENCOUNTER — Other Ambulatory Visit: Payer: Self-pay

## 2021-04-03 VITALS — BP 111/66 | HR 94 | Temp 98.4°F | Ht 69.0 in | Wt 234.5 lb

## 2021-04-03 DIAGNOSIS — E1159 Type 2 diabetes mellitus with other circulatory complications: Secondary | ICD-10-CM

## 2021-04-03 DIAGNOSIS — E785 Hyperlipidemia, unspecified: Secondary | ICD-10-CM

## 2021-04-03 DIAGNOSIS — R899 Unspecified abnormal finding in specimens from other organs, systems and tissues: Secondary | ICD-10-CM

## 2021-04-03 DIAGNOSIS — E1169 Type 2 diabetes mellitus with other specified complication: Secondary | ICD-10-CM | POA: Diagnosis not present

## 2021-04-03 DIAGNOSIS — I152 Hypertension secondary to endocrine disorders: Secondary | ICD-10-CM | POA: Diagnosis not present

## 2021-04-03 DIAGNOSIS — E875 Hyperkalemia: Secondary | ICD-10-CM

## 2021-04-03 NOTE — Patient Instructions (Signed)
Diabetes Mellitus and Nutrition, Adult When you have diabetes, or diabetes mellitus, it is very important to have healthy eating habits because your blood sugar (glucose) levels are greatly affected by what you eat and drink. Eating healthy foods in the right amounts, at about the same times every day, can help you:  Control your blood glucose.  Lower your risk of heart disease.  Improve your blood pressure.  Reach or maintain a healthy weight. What can affect my meal plan? Every person with diabetes is different, and each person has different needs for a meal plan. Your health care provider may recommend that you work with a dietitian to make a meal plan that is best for you. Your meal plan may vary depending on factors such as:  The calories you need.  The medicines you take.  Your weight.  Your blood glucose, blood pressure, and cholesterol levels.  Your activity level.  Other health conditions you have, such as heart or kidney disease. How do carbohydrates affect me? Carbohydrates, also called carbs, affect your blood glucose level more than any other type of food. Eating carbs naturally raises the amount of glucose in your blood. Carb counting is a method for keeping track of how many carbs you eat. Counting carbs is important to keep your blood glucose at a healthy level, especially if you use insulin or take certain oral diabetes medicines. It is important to know how many carbs you can safely have in each meal. This is different for every person. Your dietitian can help you calculate how many carbs you should have at each meal and for each snack. How does alcohol affect me? Alcohol can cause a sudden decrease in blood glucose (hypoglycemia), especially if you use insulin or take certain oral diabetes medicines. Hypoglycemia can be a life-threatening condition. Symptoms of hypoglycemia, such as sleepiness, dizziness, and confusion, are similar to symptoms of having too much  alcohol.  Do not drink alcohol if: ? Your health care provider tells you not to drink. ? You are pregnant, may be pregnant, or are planning to become pregnant.  If you drink alcohol: ? Do not drink on an empty stomach. ? Limit how much you use to:  0-1 drink a day for women.  0-2 drinks a day for men. ? Be aware of how much alcohol is in your drink. In the [place] one drink equals one 12 oz bottle of beer (355 mL), one 5 oz glass of wine (148 mL), or one 1 oz glass of hard liquor (44 mL). ? Keep yourself hydrated with water, diet soda, or unsweetened iced tea.  Keep in mind that regular soda, juice, and other mixers may contain a lot of sugar and must be counted as carbs. What are tips for following this plan? Reading food labels  Start by checking the serving size on the "Nutrition Facts" label of packaged foods and drinks. The amount of calories, carbs, fats, and other nutrients listed on the label is based on one serving of the item. Many items contain more than one serving per package.  Check the total grams (g) of carbs in one serving. You can calculate the number of servings of carbs in one serving by dividing the total carbs by 15. For example, if a food has 30 g of total carbs per serving, it would be equal to 2 servings of carbs.  Check the number of grams (g) of saturated fats and trans fats in one serving. Choose foods that have   a low amount or none of these fats.  Check the number of milligrams (mg) of salt (sodium) in one serving. Most people should limit total sodium intake to less than 2,300 mg per day.  Always check the nutrition information of foods labeled as "low-fat" or "nonfat." These foods may be higher in added sugar or refined carbs and should be avoided.  Talk to your dietitian to identify your daily goals for nutrients listed on the label. Shopping  Avoid buying canned, pre-made, or processed foods. These foods tend to be high in fat, sodium, and added  sugar.  Shop around the outside edge of the grocery store. This is where you will most often find fresh fruits and vegetables, bulk grains, fresh meats, and fresh dairy. Cooking  Use low-heat cooking methods, such as baking, instead of high-heat cooking methods like deep frying.  Cook using healthy oils, such as olive, canola, or sunflower oil.  Avoid cooking with butter, cream, or high-fat meats. Meal planning  Eat meals and snacks regularly, preferably at the same times every day. Avoid going long periods of time without eating.  Eat foods that are high in fiber, such as fresh fruits, vegetables, beans, and whole grains. Talk with your dietitian about how many servings of carbs you can eat at each meal.  Eat 4-6 oz (112-168 g) of lean protein each day, such as lean meat, chicken, fish, eggs, or tofu. One ounce (oz) of lean protein is equal to: ? 1 oz (28 g) of meat, chicken, or fish. ? 1 egg. ?  cup (62 g) of tofu.  Eat some foods each day that contain healthy fats, such as avocado, nuts, seeds, and fish.   What foods should I eat? Fruits Berries. Apples. Oranges. Peaches. Apricots. Plums. Grapes. Mango. Papaya. Pomegranate. Kiwi. Cherries. Vegetables Lettuce. Spinach. Leafy greens, including kale, chard, collard greens, and mustard greens. Beets. Cauliflower. Cabbage. Broccoli. Carrots. Green beans. Tomatoes. Peppers. Onions. Cucumbers. Brussels sprouts. Grains Whole grains, such as whole-wheat or whole-grain bread, crackers, tortillas, cereal, and pasta. Unsweetened oatmeal. Quinoa. Brown or wild rice. Meats and other proteins Seafood. Poultry without skin. Lean cuts of poultry and beef. Tofu. Nuts. Seeds. Dairy Low-fat or fat-free dairy products such as milk, yogurt, and cheese. The items listed above may not be a complete list of foods and beverages you can eat. Contact a dietitian for more information. What foods should I avoid? Fruits Fruits canned with  syrup. Vegetables Canned vegetables. Frozen vegetables with butter or cream sauce. Grains Refined white flour and flour products such as bread, pasta, snack foods, and cereals. Avoid all processed foods. Meats and other proteins Fatty cuts of meat. Poultry with skin. Breaded or fried meats. Processed meat. Avoid saturated fats. Dairy Full-fat yogurt, cheese, or milk. Beverages Sweetened drinks, such as soda or iced tea. The items listed above may not be a complete list of foods and beverages you should avoid. Contact a dietitian for more information. Questions to ask a health care provider  Do I need to meet with a diabetes educator?  Do I need to meet with a dietitian?  What number can I call if I have questions?  When are the best times to check my blood glucose? Where to find more information:  American Diabetes Association: diabetes.org  Academy of Nutrition and Dietetics: www.eatright.org  National Institute of Diabetes and Digestive and Kidney Diseases: www.niddk.nih.gov  Association of Diabetes Care and Education Specialists: www.diabeteseducator.org Summary  It is important to have healthy eating   habits because your blood sugar (glucose) levels are greatly affected by what you eat and drink.  A healthy meal plan will help you control your blood glucose and maintain a healthy lifestyle.  Your health care provider may recommend that you work with a dietitian to make a meal plan that is best for you.  Keep in mind that carbohydrates (carbs) and alcohol have immediate effects on your blood glucose levels. It is important to count carbs and to use alcohol carefully. This information is not intended to replace advice given to you by your health care provider. Make sure you discuss any questions you have with your health care provider. Document Revised: 07/11/2019 Document Reviewed: 07/11/2019 Elsevier Patient Education  2021 Elsevier Inc.  

## 2021-04-03 NOTE — Progress Notes (Signed)
Established Patient Office Visit  Subjective:  Patient ID: Joseph Collier, male    DOB: 1951/02/10  Age: 70 y.o. MRN: [id number]  CC:  Chief Complaint  Patient presents with   Hypertension   Diabetes   Hyperlipidemia    HPI Joseph Collier presents for follow up on diabetes mellitus, hypertension and hyperlipidemia. Patient has no acute concerns.  Diabetes: Pt denies increased urination or thirst. Pt reports medication compliance. Tolerating increased dose of Trulicity without issues. No hypoglycemic events. Checking glucose at home. FBS range <140. Trying to monitor the sweets. Staying active with work and working outside the house. Has noticed some weight loss.  HTN: Pt denies chest pain, palpitations, dizziness or lower extremity swelling. Taking medication as directed without side effects. Checks BP when going to [place] or at the pharmacy and reports readings are 120s/70-80s. Pt reports has reduced sodium intake and trying to stay hydrated.  HLD: Pt taking medication as directed without issues. Denies side effects including myalgias, muscle weakness or muscle cramps.  Hyperkalemia: Patient denies weakness, shortness of breath, chest pain or numbness/tingling sensation. Reports has been eating a lot of bananas.   Past Medical History:  Diagnosis Date   Allergic rhinitis, cause unspecified    Anemia, unspecified    Arthritis    Cancer ([place]) 08/17/2006   Melanoma   Essential hypertension, benign    Impotence of organic origin 04/14/2007   Obesity, unspecified    Other and unspecified hyperlipidemia    Personal history of colonic polyps    Skin lesion 04/14/2007   melanoma    Past Surgical History:  Procedure Laterality Date   APPENDECTOMY     arthroscopy of knee  2008   Left Knee   COLONOSCOPY  08/18/2007   normal. [name].   HERNIA REPAIR     age 17   JOINT REPLACEMENT     melanoma of forearm resection  1992   Left        [name]   TONSILLECTOMY AND ADENOIDECTOMY       Family History  Problem Relation Age of Onset   Coronary artery disease Father    Hypertension Father    Heart disease Father        AMI cause of death   Diabetes Father    Cancer Mother 49       lung   Hypertension Brother    Diabetes Brother     Social History   Socioeconomic History   Marital status: Married    Spouse name: Programmer, multimedia   Number of children: 1   Years of education: 12   Highest education level: Not on file  Occupational History   Occupation: outside Press photographer    Comment: engineered woods since 2000    Employer: [employer]  Tobacco Use   Smoking status: Former    Packs/day: 1.00    Years: 25.00    Pack years: 25.00    Types: Cigarettes    Quit date: 08/17/1989    Years since quitting: 31.6   Smokeless tobacco: Never   Tobacco comments:    quit 1991  Vaping Use   Vaping Use: Never used  Substance and Sexual Activity   Alcohol use: Yes    Alcohol/week: 5.0 standard drinks    Types: 5 Cans of beer per week    Comment: moderate: drinks hard liquor 7 drinks per month in 08/2011/4 drinks 4 a week   Drug use: No   Sexual activity: Yes  Birth control/protection: Post-menopausal  Other Topics Concern   Not on file  Social History Narrative   Marital status:  Married x 25 years, happily, second marriage.       Children:  1 son (72 yo.)  Son in [place], [place].       Lives with wife.       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      Tobacco: none       Alcohol:    3 drinks per night every other night.       Drugs: none       Exercise: Light, walking, 3 times a week.        Caffeine use: carbonated beverages; consumes a minimal amount.        Seatbelt:  Always uses seat belts, no texting.       Guns:  Guns in the home stored in locked cabinet.        Home Safety:  Smoke Alarms in the home.        Education: [employer].   Social Determinants of Health   Financial Resource Strain: Not on file   Food Insecurity: Not on file  Transportation Needs: Not on file  Physical Activity: Not on file  Stress: Not on file  Social Connections: Not on file  Intimate Partner Violence: Not on file    Outpatient Medications Prior to Visit  Medication Sig Dispense Refill   aspirin EC 81 MG tablet Take 81 mg by mouth daily.     Dulaglutide (TRULICITY) 3 GU/5.4YH SOPN Inject 3 mg as directed once a week. 6 mL 0   FARXIGA 10 MG TABS tablet Take 1 tablet by mouth once daily 90 tablet 0   fluticasone (FLONASE) 50 MCG/ACT nasal spray Place 2 sprays into both nostrils daily.     glipiZIDE (GLUCOTROL) 10 MG tablet Take 1 tablet (10 mg total) by mouth 2 (two) times daily before a meal. 180 tablet 0   glucose blood test strip Use as instructed 100 each 12   lisinopril (ZESTRIL) 40 MG tablet Take 1 tablet by mouth once daily 90 tablet 0   metFORMIN (GLUCOPHAGE) 1000 MG tablet TAKE ONE TABLET BY MOUTH IN THE MORNING AND ONE IN THE EVENING WITH MEALS DAILY 180 tablet 0   omeprazole (PRILOSEC) 20 MG capsule Take 1 capsule by mouth once daily 90 capsule 0   sildenafil (REVATIO) 20 MG tablet TAKE ONE TO THREE TABLETS BY MOUTH PRIOR TO INTERCOURSE AS NEEDED 30 tablet 0   simvastatin (ZOCOR) 20 MG tablet TAKE 1 TABLET BY MOUTH AT BEDTIME 90 tablet 0   No facility-administered medications prior to visit.    Allergies  Allergen Reactions   Penicillins Hives and Swelling   Saxagliptin    Tetracyclines & Related Hives and Swelling    ROS Review of Systems Review of Systems:  A fourteen system review of systems was performed and found to be positive as per HPI.   Objective:    Physical Exam General:  Well Developed, well nourished, appropriate for stated age.  Neuro:  Alert and oriented,  extra-ocular muscles intact  HEENT:  Normocephalic, atraumatic, neck supple Skin:  no gross rash, warm, pink. Cardiac:  RRR, S1 S2, no murmur  Respiratory:  CTA B/L w/o wheezing, Not using accessory muscles, speaking  in full sentences- unlabored. Vascular:  Ext warm, no cyanosis apprec.; cap RF less 2 sec. Psych:  No HI/SI, judgement and insight  good, Euthymic mood. Full Affect.  BP 111/66   Pulse 94   Temp 98.4 F (36.9 C)   Ht '5\' 9"'  (1.753 m)   Wt 234 lb 8 oz (106.4 kg)   SpO2 99%   BMI 34.63 kg/m  Wt Readings from Last 3 Encounters:  04/03/21 234 lb 8 oz (106.4 kg)  01/30/21 242 lb (109.8 kg)  12/31/20 242 lb 14.4 oz (110.2 kg)     Health Maintenance Due  Topic Date Due   COLONOSCOPY (Pts 45-36yr Insurance coverage will need to be confirmed)  01/24/2018   COVID-19 Vaccine (3 - Pfizer risk series) 11/21/2019   PNA vac Low Risk Adult (2 of 2 - PPSV23) 06/15/2020   INFLUENZA VACCINE  03/17/2021    There are no preventive care reminders to display for this patient.  Lab Results  Component Value Date   TSH 1.570 09/27/2020   Lab Results  Component Value Date   WBC 8.6 09/27/2020   HGB 15.1 09/27/2020   HCT 45.6 09/27/2020   MCV 89 09/27/2020   PLT 305 09/27/2020   Lab Results  Component Value Date   NA 140 03/28/2021   K 6.0 (H) 03/28/2021   CO2 25 03/28/2021   GLUCOSE 114 (H) 03/28/2021   BUN 16 03/28/2021   CREATININE 1.11 03/28/2021   BILITOT 0.5 03/28/2021   ALKPHOS 48 03/28/2021   AST 23 03/28/2021   ALT 36 03/28/2021   PROT 7.0 03/28/2021   ALBUMIN 4.9 (H) 03/28/2021   CALCIUM 10.3 (H) 03/28/2021   EGFR 72 03/28/2021   Lab Results  Component Value Date   CHOL 98 (L) 03/28/2021   Lab Results  Component Value Date   HDL 33 (L) 03/28/2021   Lab Results  Component Value Date   LDLCALC 49 03/28/2021   Lab Results  Component Value Date   TRIG 74 03/28/2021   Lab Results  Component Value Date   CHOLHDL 3.0 03/28/2021   Lab Results  Component Value Date   HGBA1C 6.5 (H) 03/28/2021      Assessment & Plan:   Problem List Items Addressed This Visit       Cardiovascular and Mediastinum   Hy[place] (Ch[place])     -At goal. -Continue current medication regimen. Will repeat CMP in 1 wk. Advised patient to avoid dietary potassium, if potassium continues to be elevated will reduce/hold Lisinopril. -Will continue to monitor.        Endocrine   Hyperlipidemia associated with type 2 diabetes mellitus (HCC) (Chronic)    -Discussed recent lipid panel, total cholesterol 98, triglycerides 74, HDL 33, LDL 49. -Continue current medication regimen. Discussed with patient decreasing simvastatin if total cholesterol remains low and LDL remains at goal. Will repeat lipid panel next follow up visit.        Type 2 diabetes mellitus with other specified complication (HCC) - Primary    -A1c improved from 7.0 to 6.5, will continue current medication regimen. -Continue ambulatory glucose monitoring. -Will continue to monitor.      Other Visit Diagnoses     Hyperkalemia           Hyperkalemia: -Asymptomatic. Recent potassium 6.0, likely multifactorial from medication (Lisinopril ) and increased consumption of potassium. Advised to avoid/reduce dietary potassium and will repeat potassium (CMP) in 1 week. Advised to monitor for symptoms.  No orders of the defined types were placed in this encounter.   Follow-up: Return in about 3 months (around 07/04/2021) for DM,  HLD, HTN with FBW few days before; next week for labs (CMP).    Lorrene Reid, PA-C

## 2021-04-03 NOTE — Assessment & Plan Note (Addendum)
-  At goal. -Continue current medication regimen. Will repeat CMP in 1 wk. Advised patient to avoid dietary potassium, if potassium continues to be elevated will reduce/hold Lisinopril. -Will continue to monitor.

## 2021-04-03 NOTE — Assessment & Plan Note (Signed)
-  Discussed recent lipid panel, total cholesterol 98, triglycerides 74, HDL 33, LDL 49. -Continue current medication regimen. Discussed with patient decreasing simvastatin if total cholesterol remains low and LDL remains at goal. Will repeat lipid panel next follow up visit.

## 2021-04-03 NOTE — Assessment & Plan Note (Signed)
-  A1c improved from 7.0 to 6.5, will continue current medication regimen. -Continue ambulatory glucose monitoring. -Will continue to monitor.

## 2021-04-07 ENCOUNTER — Other Ambulatory Visit: Payer: Self-pay | Admitting: Physician Assistant

## 2021-04-07 DIAGNOSIS — E1169 Type 2 diabetes mellitus with other specified complication: Secondary | ICD-10-CM

## 2021-04-07 DIAGNOSIS — E785 Hyperlipidemia, unspecified: Secondary | ICD-10-CM

## 2021-04-10 ENCOUNTER — Other Ambulatory Visit: Payer: Self-pay

## 2021-04-10 ENCOUNTER — Other Ambulatory Visit: Payer: Medicare Other

## 2021-04-10 DIAGNOSIS — I152 Hypertension secondary to endocrine disorders: Secondary | ICD-10-CM | POA: Diagnosis not present

## 2021-04-10 DIAGNOSIS — E1159 Type 2 diabetes mellitus with other circulatory complications: Secondary | ICD-10-CM

## 2021-04-10 DIAGNOSIS — R899 Unspecified abnormal finding in specimens from other organs, systems and tissues: Secondary | ICD-10-CM | POA: Diagnosis not present

## 2021-04-11 LAB — COMPREHENSIVE METABOLIC PANEL
ALT: 31 IU/L (ref 0–44)
AST: 19 IU/L (ref 0–40)
Albumin/Globulin Ratio: 2.3 — ABNORMAL HIGH (ref 1.2–2.2)
Albumin: 4.5 g/dL (ref 3.8–4.8)
Alkaline Phosphatase: 45 IU/L (ref 44–121)
BUN/Creatinine Ratio: 14 (ref 10–24)
BUN: 14 mg/dL (ref 8–27)
Bilirubin Total: 0.4 mg/dL (ref 0.0–1.2)
CO2: 23 mmol/L (ref 20–29)
Calcium: 9.8 mg/dL (ref 8.6–10.2)
Chloride: 101 mmol/L (ref 96–106)
Creatinine, Ser: 0.99 mg/dL (ref 0.76–1.27)
Globulin, Total: 2 g/dL (ref 1.5–4.5)
Glucose: 145 mg/dL — ABNORMAL HIGH (ref 65–99)
Potassium: 5.1 mmol/L (ref 3.5–5.2)
Sodium: 138 mmol/L (ref 134–144)
Total Protein: 6.5 g/dL (ref 6.0–8.5)
eGFR: 83 mL/min/{1.73_m2} (ref >59)

## 2021-05-02 ENCOUNTER — Other Ambulatory Visit: Payer: Self-pay | Admitting: Physician Assistant

## 2021-05-02 DIAGNOSIS — K219 Gastro-esophageal reflux disease without esophagitis: Secondary | ICD-10-CM

## 2021-05-02 DIAGNOSIS — I152 Hypertension secondary to endocrine disorders: Secondary | ICD-10-CM

## 2021-05-02 DIAGNOSIS — E1169 Type 2 diabetes mellitus with other specified complication: Secondary | ICD-10-CM

## 2021-05-02 DIAGNOSIS — E1159 Type 2 diabetes mellitus with other circulatory complications: Secondary | ICD-10-CM

## 2021-06-10 ENCOUNTER — Other Ambulatory Visit: Payer: Self-pay | Admitting: Physician Assistant

## 2021-06-10 DIAGNOSIS — E1169 Type 2 diabetes mellitus with other specified complication: Secondary | ICD-10-CM

## 2021-06-10 DIAGNOSIS — Z794 Long term (current) use of insulin: Secondary | ICD-10-CM

## 2021-06-18 ENCOUNTER — Other Ambulatory Visit: Payer: Self-pay | Admitting: Physician Assistant

## 2021-06-18 DIAGNOSIS — E1169 Type 2 diabetes mellitus with other specified complication: Secondary | ICD-10-CM

## 2021-06-28 ENCOUNTER — Other Ambulatory Visit: Payer: Self-pay | Admitting: Physician Assistant

## 2021-06-28 DIAGNOSIS — E1169 Type 2 diabetes mellitus with other specified complication: Secondary | ICD-10-CM

## 2021-06-30 ENCOUNTER — Other Ambulatory Visit: Payer: Self-pay

## 2021-06-30 DIAGNOSIS — I152 Hypertension secondary to endocrine disorders: Secondary | ICD-10-CM

## 2021-06-30 DIAGNOSIS — E1159 Type 2 diabetes mellitus with other circulatory complications: Secondary | ICD-10-CM

## 2021-06-30 DIAGNOSIS — E1169 Type 2 diabetes mellitus with other specified complication: Secondary | ICD-10-CM

## 2021-07-01 ENCOUNTER — Other Ambulatory Visit: Payer: Medicare Other

## 2021-07-01 ENCOUNTER — Other Ambulatory Visit: Payer: Self-pay

## 2021-07-01 DIAGNOSIS — E1159 Type 2 diabetes mellitus with other circulatory complications: Secondary | ICD-10-CM

## 2021-07-01 DIAGNOSIS — E1169 Type 2 diabetes mellitus with other specified complication: Secondary | ICD-10-CM | POA: Diagnosis not present

## 2021-07-01 DIAGNOSIS — I152 Hypertension secondary to endocrine disorders: Secondary | ICD-10-CM | POA: Diagnosis not present

## 2021-07-01 DIAGNOSIS — E785 Hyperlipidemia, unspecified: Secondary | ICD-10-CM | POA: Diagnosis not present

## 2021-07-02 LAB — CBC WITH DIFFERENTIAL/PLATELET
Basophils Absolute: 0.1 10*3/uL (ref 0.0–0.2)
Basos: 1 %
EOS (ABSOLUTE): 0.2 10*3/uL (ref 0.0–0.4)
Eos: 2 %
Hematocrit: 46.3 % (ref 37.5–51.0)
Hemoglobin: 15.5 g/dL (ref 13.0–17.7)
Immature Grans (Abs): 0 10*3/uL (ref 0.0–0.1)
Immature Granulocytes: 0 %
Lymphocytes Absolute: 2.4 10*3/uL (ref 0.7–3.1)
Lymphs: 27 %
MCH: 30.2 pg (ref 26.6–33.0)
MCHC: 33.5 g/dL (ref 31.5–35.7)
MCV: 90 fL (ref 79–97)
Monocytes Absolute: 0.9 10*3/uL (ref 0.1–0.9)
Monocytes: 10 %
Neutrophils Absolute: 5.5 10*3/uL (ref 1.4–7.0)
Neutrophils: 60 %
Platelets: 301 10*3/uL (ref 150–450)
RBC: 5.14 x10E6/uL (ref 4.14–5.80)
RDW: 12 % (ref 11.6–15.4)
WBC: 9 10*3/uL (ref 3.4–10.8)

## 2021-07-02 LAB — LIPID PANEL
Chol/HDL Ratio: 2.6 ratio (ref 0.0–5.0)
Cholesterol, Total: 108 mg/dL (ref 100–199)
HDL: 41 mg/dL
LDL Chol Calc (NIH): 54 mg/dL (ref 0–99)
Triglycerides: 58 mg/dL (ref 0–149)
VLDL Cholesterol Cal: 13 mg/dL (ref 5–40)

## 2021-07-02 LAB — COMPREHENSIVE METABOLIC PANEL
ALT: 24 IU/L (ref 0–44)
AST: 19 IU/L (ref 0–40)
Albumin/Globulin Ratio: 2.4 — ABNORMAL HIGH (ref 1.2–2.2)
Albumin: 4.7 g/dL (ref 3.8–4.8)
Alkaline Phosphatase: 52 IU/L (ref 44–121)
BUN/Creatinine Ratio: 11 (ref 10–24)
BUN: 11 mg/dL (ref 8–27)
Bilirubin Total: 0.7 mg/dL (ref 0.0–1.2)
CO2: 24 mmol/L (ref 20–29)
Calcium: 9.5 mg/dL (ref 8.6–10.2)
Chloride: 100 mmol/L (ref 96–106)
Creatinine, Ser: 1.03 mg/dL (ref 0.76–1.27)
Globulin, Total: 2 g/dL (ref 1.5–4.5)
Glucose: 106 mg/dL — ABNORMAL HIGH (ref 70–99)
Potassium: 4.4 mmol/L (ref 3.5–5.2)
Sodium: 138 mmol/L (ref 134–144)
Total Protein: 6.7 g/dL (ref 6.0–8.5)
eGFR: 79 mL/min/{1.73_m2} (ref >59)

## 2021-07-02 LAB — HEMOGLOBIN A1C
Est. average glucose Bld gHb Est-mCnc: 143 mg/dL
Hgb A1c MFr Bld: 6.6 % — ABNORMAL HIGH (ref 4.8–5.6)

## 2021-07-02 LAB — TSH: TSH: 1.77 u[IU]/mL (ref 0.450–4.500)

## 2021-07-08 ENCOUNTER — Encounter: Payer: Self-pay | Admitting: Physician Assistant

## 2021-07-08 ENCOUNTER — Ambulatory Visit (INDEPENDENT_AMBULATORY_CARE_PROVIDER_SITE_OTHER): Payer: Medicare Other | Admitting: Physician Assistant

## 2021-07-08 ENCOUNTER — Other Ambulatory Visit: Payer: Self-pay

## 2021-07-08 VITALS — BP 138/80 | HR 80 | Temp 97.7°F | Ht 69.0 in | Wt 235.0 lb

## 2021-07-08 DIAGNOSIS — E785 Hyperlipidemia, unspecified: Secondary | ICD-10-CM | POA: Diagnosis not present

## 2021-07-08 DIAGNOSIS — Z794 Long term (current) use of insulin: Secondary | ICD-10-CM

## 2021-07-08 DIAGNOSIS — Z23 Encounter for immunization: Secondary | ICD-10-CM | POA: Diagnosis not present

## 2021-07-08 DIAGNOSIS — I152 Hypertension secondary to endocrine disorders: Secondary | ICD-10-CM | POA: Diagnosis not present

## 2021-07-08 DIAGNOSIS — E1169 Type 2 diabetes mellitus with other specified complication: Secondary | ICD-10-CM

## 2021-07-08 DIAGNOSIS — K219 Gastro-esophageal reflux disease without esophagitis: Secondary | ICD-10-CM

## 2021-07-08 DIAGNOSIS — E1159 Type 2 diabetes mellitus with other circulatory complications: Secondary | ICD-10-CM

## 2021-07-08 MED ORDER — LISINOPRIL 40 MG PO TABS: 40.0000 mg | ORAL_TABLET | Freq: Every day | ORAL | 1 refills | Status: DC

## 2021-07-08 MED ORDER — SIMVASTATIN 20 MG PO TABS: 20.0000 mg | ORAL_TABLET | Freq: Every day | ORAL | 1 refills | Status: DC

## 2021-07-08 MED ORDER — DAPAGLIFLOZIN PROPANEDIOL 10 MG PO TABS: 10.0000 mg | ORAL_TABLET | Freq: Every day | ORAL | 1 refills | Status: DC

## 2021-07-08 MED ORDER — TRULICITY 3 MG/0.5ML ~~LOC~~ SOAJ: SUBCUTANEOUS | 1 refills | Status: DC

## 2021-07-08 MED ORDER — OMEPRAZOLE 20 MG PO CPDR: 20.0000 mg | DELAYED_RELEASE_CAPSULE | Freq: Every day | ORAL | 1 refills | Status: DC

## 2021-07-08 MED ORDER — METFORMIN HCL 1000 MG PO TABS
ORAL_TABLET | ORAL | 1 refills | Status: DC
Start: 2021-07-08 — End: 2022-01-26

## 2021-07-08 NOTE — Assessment & Plan Note (Signed)
-  BP initially elevated, BP repeated and improved. -Continue current medication regimen. -Discussed recent CMP, renal function and electrolytes normal. -Will continue to monitor.

## 2021-07-08 NOTE — Patient Instructions (Signed)

## 2021-07-08 NOTE — Assessment & Plan Note (Signed)
-  Discussed recent lipid panel which has improved, LDL at goal <70. -Continue current medication regimen. Recent hepatic function normal. -Discussed heart healthy diet low in saturated and trans fats. -Will continue to monitor.

## 2021-07-08 NOTE — Assessment & Plan Note (Signed)
-  Stable. -Continue current medication regimen.  -Will continue to monitor. 

## 2021-07-08 NOTE — Assessment & Plan Note (Signed)
-  Associated with T2DM, HTN, HLD. -Encourage dietary and lifestyle changes.

## 2021-07-08 NOTE — Progress Notes (Signed)
Established Patient Office Visit  Subjective:  Patient ID: Joseph Collier, male    DOB: [birth year]  Age: 70 y.o. MRN: [id number]  CC:  Chief Complaint  Patient presents with   Follow-up   Diabetes   Hyperlipidemia   Hypertension    HPI [name] presents for follow up on diabetes mellitus, hypertension and hyperlipidemia.  Diabetes: Pt denies increased urination or thirst. Pt reports medication compliance. No hypoglycemic events. Checking glucose at home. FBS range 140s.  HTN: Pt denies chest pain, palpitations, dizziness or leg swelling. Taking medication as directed without side effects. Reports has not been checking BP at home. Pt monitors sodium intake. Trying to increase water instead of diet Dr. Malachi Bonds.   HLD: Pt taking medication as directed without issues. Reports has not been as active since colder weather started.   GERD: Reports takes medication daily which helps keep acid reflux symptoms controlled. States if misses a dose will have heartburn just with drinking water.  Weight: Patient reports has not made dietary changes and physical activity has decreased.   Past Medical History:  Diagnosis Date   Allergic rhinitis, cause unspecified    Anemia, unspecified    Arthritis    Cancer ([place]) 08/17/2006   Melanoma   Essential hypertension, benign    Impotence of organic origin 04/14/2007   Obesity, unspecified    Other and unspecified hyperlipidemia    Personal history of colonic polyps    Skin lesion 04/14/2007   melanoma    Past Surgical History:  Procedure Laterality Date   APPENDECTOMY     arthroscopy of knee  2008   Left Knee   COLONOSCOPY  08/18/2007   normal. [name].   HERNIA REPAIR     age 47   JOINT REPLACEMENT     melanoma of forearm resection  1992   Left        [name]   TONSILLECTOMY AND ADENOIDECTOMY      Family History  Problem Relation Age of Onset   Coronary artery disease Father    Hypertension Father    Heart disease Father         AMI cause of death   Diabetes Father    Cancer Mother 70       lung   Hypertension Brother    Diabetes Brother     Social History   Socioeconomic History   Marital status: Married    Spouse name: Programmer, multimedia   Number of children: 1   Years of education: 12   Highest education level: Not on file  Occupational History   Occupation: outside Press photographer    Comment: engineered woods since 2000    Employer: [employer]  Tobacco Use   Smoking status: Former    Packs/day: 1.00    Years: 25.00    Pack years: 25.00    Types: Cigarettes    Quit date: 08/17/1989    Years since quitting: 31.9   Smokeless tobacco: Never   Tobacco comments:    quit 1991  Vaping Use   Vaping Use: Never used  Substance and Sexual Activity   Alcohol use: Yes    Alcohol/week: 5.0 standard drinks    Types: 5 Cans of beer per week    Comment: moderate: drinks hard liquor 7 drinks per month in 08/2011/4 drinks 4 a week   Drug use: No   Sexual activity: Yes    Birth control/protection: Post-menopausal  Other Topics Concern   Not on file  Social  History Narrative   Marital status:  Married x 25 years, happily, second marriage.       Children:  1 son (9 yo.)  Son in [place], [place].       Lives with wife.       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      Tobacco: none       Alcohol:    3 drinks per night every other night.       Drugs: none       Exercise: Light, walking, 3 times a week.        Caffeine use: carbonated beverages; consumes a minimal amount.        Seatbelt:  Always uses seat belts, no texting.       Guns:  Guns in the home stored in locked cabinet.        Home Safety:  Smoke Alarms in the home.        Education: [employer].   Social Determinants of Health   Financial Resource Strain: Not on file  Food Insecurity: Not on file  Transportation Needs: Not on file  Physical Activity: Not on file  Stress: Not on file  Social Connections: Not  on file  Intimate Partner Violence: Not on file    Outpatient Medications Prior to Visit  Medication Sig Dispense Refill   aspirin EC 81 MG tablet Take 81 mg by mouth daily.     fluticasone (FLONASE) 50 MCG/ACT nasal spray Place 2 sprays into both nostrils daily.     glipiZIDE (GLUCOTROL) 10 MG tablet TAKE 1 TABLET BY MOUTH TWICE DAILY BEFORE A MEAL 180 tablet 1   glucose blood test strip Use as instructed 100 each 12   sildenafil (REVATIO) 20 MG tablet TAKE ONE TO THREE TABLETS BY MOUTH PRIOR TO INTERCOURSE AS NEEDED 30 tablet 0   FARXIGA 10 MG TABS tablet Take 1 tablet by mouth once daily 90 tablet 0   lisinopril (ZESTRIL) 40 MG tablet Take 1 tablet by mouth once daily 90 tablet 0   metFORMIN (GLUCOPHAGE) 1000 MG tablet TAKE 1 TABLET BY MOUTH IN THE MORNING AND TAKE 1 TABLET IN THE EVENING WITH MEALS 180 tablet 0   omeprazole (PRILOSEC) 20 MG capsule Take 1 capsule by mouth once daily 90 capsule 0   simvastatin (ZOCOR) 20 MG tablet TAKE 1 TABLET BY MOUTH AT BEDTIME 90 tablet 0   TRULICITY 3 SW/9.6PR SOPN INJECT 3 MG  ONCE A WEEK 12 mL 0   No facility-administered medications prior to visit.    Allergies  Allergen Reactions   Penicillins Hives and Swelling   Saxagliptin    Tetracyclines & Related Hives and Swelling    ROS Review of Systems A fourteen system review of systems was performed and found to be positive as per HPI.    Objective:    Physical Exam General:  Well Developed, well nourished, in no acute distress   Neuro:  Alert and oriented,  extra-ocular muscles intact  HEENT:  Normocephalic, atraumatic, neck supple Skin:  no gross rash, warm, pink. Cardiac:  RRR, S1 S2 Respiratory:  CTA B/L w/o wheezing, crackles rales, No respiratory distress. Vascular:  Ext warm, no cyanosis apprec.; cap RF less 2 sec. Psych:  No HI/SI, judgement and insight good, Euthymic mood. Full Affect.  BP 138/80   Pulse 80   Temp 97.7 F (36.5 C)   Ht 5' 9" (1.753 m)   Wt 235  lb  (106.6 kg)   SpO2 97%   BMI 34.70 kg/m  Wt Readings from Last 3 Encounters:  07/08/21 235 lb (106.6 kg)  04/03/21 234 lb 8 oz (106.4 kg)  01/30/21 242 lb (109.8 kg)     Health Maintenance Due  Topic Date Due   COLONOSCOPY (Pts 45-80yr Insurance coverage will need to be confirmed)  01/24/2018   COVID-19 Vaccine (3 - Pfizer risk series) 11/21/2019   Pneumonia Vaccine 68 Years old (3 - PPSV23 if available, else PCV20) 06/15/2020    There are no preventive care reminders to display for this patient.  Lab Results  Component Value Date   TSH 1.770 07/01/2021   Lab Results  Component Value Date   WBC 9.0 07/01/2021   HGB 15.5 07/01/2021   HCT 46.3 07/01/2021   MCV 90 07/01/2021   PLT 301 07/01/2021   Lab Results  Component Value Date   NA 138 07/01/2021   K 4.4 07/01/2021   CO2 24 07/01/2021   GLUCOSE 106 (H) 07/01/2021   BUN 11 07/01/2021   CREATININE 1.03 07/01/2021   BILITOT 0.7 07/01/2021   ALKPHOS 52 07/01/2021   AST 19 07/01/2021   ALT 24 07/01/2021   PROT 6.7 07/01/2021   ALBUMIN 4.7 07/01/2021   CALCIUM 9.5 07/01/2021   EGFR 79 07/01/2021   Lab Results  Component Value Date   CHOL 108 07/01/2021   Lab Results  Component Value Date   HDL 41 07/01/2021   Lab Results  Component Value Date   LDLCALC 54 07/01/2021   Lab Results  Component Value Date   TRIG 58 07/01/2021   Lab Results  Component Value Date   CHOLHDL 2.6 07/01/2021   Lab Results  Component Value Date   HGBA1C 6.6 (H) 07/01/2021      Assessment & Plan:   Problem List Items Addressed This Visit       Cardiovascular and Mediastinum   Hypertension associated with diabetes ([place] (Chronic)    -BP initially elevated, BP repeated and improved. -Continue current medication regimen. -Discussed recent CMP, renal function and electrolytes normal. -Will continue to monitor.      Relevant Medications   dapagliflozin propanediol (FARXIGA) 10 MG TABS tablet   lisinopril (ZESTRIL)  40 MG tablet   metFORMIN (GLUCOPHAGE) 1000 MG tablet   simvastatin (ZOCOR) 20 MG tablet   Dulaglutide (TRULICITY) 3 MXF/8.1WESOPN     Digestive   GERD (gastroesophageal reflux disease) (Chronic)    -Stable. -Continue current medication regimen. -Will continue to monitor.      Relevant Medications   omeprazole (PRILOSEC) 20 MG capsule     Endocrine   Hyperlipidemia associated with type 2 diabetes mellitus (HCC) (Chronic)    -Discussed recent lipid panel which has improved, LDL at goal <70. -Continue current medication regimen. Recent hepatic function normal. -Discussed heart healthy diet low in saturated and trans fats. -Will continue to monitor.      Relevant Medications   dapagliflozin propanediol (FARXIGA) 10 MG TABS tablet   lisinopril (ZESTRIL) 40 MG tablet   metFORMIN (GLUCOPHAGE) 1000 MG tablet   simvastatin (ZOCOR) 20 MG tablet   Dulaglutide (TRULICITY) 3 MXH/3.7JISOPN   Type 2 diabetes mellitus with other specified complication (HCC) - Primary    -A1c mildly increased from 6.5 to 6.6, stable. -Continue current medication regimen. Advised patient to let [place] if unable to obtain Tr[place]icity due to back order. Provided sample today. -Discussed low carbohydrate and glucose diet. -Will continue to  monitor.      Relevant Medications   dapagliflozin propanediol (FARXIGA) 10 MG TABS tablet   lisinopril (ZESTRIL) 40 MG tablet   metFORMIN (GLUCOPHAGE) 1000 MG tablet   simvastatin (ZOCOR) 20 MG tablet   Dulaglutide (TRULICITY) 3 HC/6.2BJ SOPN     Other   Morbid obesity (HCC) (Chronic)    -Associated with T2DM, HTN, HLD. -Encourage dietary and lifestyle changes.      Relevant Medications   dapagliflozin propanediol (FARXIGA) 10 MG TABS tablet   metFORMIN (GLUCOPHAGE) 1000 MG tablet   Dulaglutide (TRULICITY) 3 SE/8.3TD SOPN   Other Visit Diagnoses     Need for influenza vaccination       Relevant Orders   Flu Vaccine QUAD High Dose(Fluad) (Completed)       Discussed recent lab results which are essentially within normal limits or stable from prior.   Meds ordered this encounter  Medications   dapagliflozin propanediol (FARXIGA) 10 MG TABS tablet    Sig: Take 1 tablet (10 mg total) by mouth daily.    Dispense:  90 tablet    Refill:  1   lisinopril (ZESTRIL) 40 MG tablet    Sig: Take 1 tablet (40 mg total) by mouth daily.    Dispense:  90 tablet    Refill:  1   metFORMIN (GLUCOPHAGE) 1000 MG tablet    Sig: TAKE 1 TABLET BY MOUTH IN THE MORNING AND TAKE 1 TABLET IN THE EVENING WITH MEALS    Dispense:  180 tablet    Refill:  1   omeprazole (PRILOSEC) 20 MG capsule    Sig: Take 1 capsule (20 mg total) by mouth daily.    Dispense:  90 capsule    Refill:  1   simvastatin (ZOCOR) 20 MG tablet    Sig: Take 1 tablet (20 mg total) by mouth at bedtime.    Dispense:  90 tablet    Refill:  1   Dulaglutide (TRULICITY) 3 VV/6.1YW SOPN    Sig: INJECT 3 MG  ONCE A WEEK    Dispense:  12 mL    Refill:  1     Follow-up: Return in about 4 months (around 11/05/2021) for [facility]  .    Lorrene Reid, PA-C

## 2021-07-08 NOTE — Assessment & Plan Note (Signed)
-  A1c mildly increased from 6.5 to 6.6, stable. -Continue current medication regimen. Advised patient to let us know if unable to obtain Trulicity due to back order. Provided sample today. -Discussed low carbohydrate and glucose diet. -Will continue to monitor.

## 2021-07-25 ENCOUNTER — Other Ambulatory Visit: Payer: Self-pay | Admitting: Physician Assistant

## 2021-07-25 DIAGNOSIS — N529 Male erectile dysfunction, unspecified: Secondary | ICD-10-CM

## 2021-07-28 MED ORDER — SILDENAFIL CITRATE 20 MG PO TABS: ORAL_TABLET | ORAL | 0 refills | Status: DC

## 2021-11-07 ENCOUNTER — Other Ambulatory Visit: Payer: Self-pay

## 2021-11-07 ENCOUNTER — Encounter: Payer: Self-pay | Admitting: Physician Assistant

## 2021-11-07 ENCOUNTER — Ambulatory Visit (INDEPENDENT_AMBULATORY_CARE_PROVIDER_SITE_OTHER): Payer: Medicare Other | Admitting: Physician Assistant

## 2021-11-07 VITALS — BP 138/98 | HR 86 | Temp 97.6°F | Ht 68.0 in | Wt 242.0 lb

## 2021-11-07 DIAGNOSIS — E1159 Type 2 diabetes mellitus with other circulatory complications: Secondary | ICD-10-CM | POA: Diagnosis not present

## 2021-11-07 DIAGNOSIS — E1169 Type 2 diabetes mellitus with other specified complication: Secondary | ICD-10-CM | POA: Diagnosis not present

## 2021-11-07 DIAGNOSIS — Z0000 Encounter for general adult medical examination without abnormal findings: Secondary | ICD-10-CM | POA: Diagnosis not present

## 2021-11-07 DIAGNOSIS — I152 Hypertension secondary to endocrine disorders: Secondary | ICD-10-CM | POA: Diagnosis not present

## 2021-11-07 DIAGNOSIS — U071 COVID-19: Secondary | ICD-10-CM | POA: Diagnosis not present

## 2021-11-07 DIAGNOSIS — Z1211 Encounter for screening for malignant neoplasm of colon: Secondary | ICD-10-CM | POA: Diagnosis not present

## 2021-11-07 LAB — POCT GLYCOSYLATED HEMOGLOBIN (HGB A1C): Hemoglobin A1C: 6.8 % — AB (ref 4.0–5.6)

## 2021-11-07 MED ORDER — AMLODIPINE BESYLATE 2.5 MG PO TABS: 2.5000 mg | ORAL_TABLET | Freq: Every day | ORAL | 0 refills | Status: DC

## 2021-11-07 NOTE — Patient Instructions (Signed)
Preventive Care 65 Years and Older, Male °Preventive care refers to lifestyle choices and visits with your health care provider that can promote health and wellness. Preventive care visits are also called wellness exams. °What can I expect for my preventive care visit? °Counseling °During your preventive care visit, your health care provider may ask about your: °Medical history, including: °Past medical problems. °Family medical history. °History of falls. °Current health, including: °Emotional well-being. °Home life and relationship well-being. °Sexual activity. °Memory and ability to understand (cognition). °Lifestyle, including: °Alcohol, nicotine or tobacco, and drug use. °Access to firearms. °Diet, exercise, and sleep habits. °Work and work environment. °Sunscreen use. °Safety issues such as seatbelt and bike helmet use. °Physical exam °Your health care provider will check your: °Height and weight. These may be used to calculate your BMI (body mass index). BMI is a measurement that tells if you are at a healthy weight. °Waist circumference. This measures the distance around your waistline. This measurement also tells if you are at a healthy weight and may help predict your risk of certain diseases, such as type 2 diabetes and high blood pressure. °Heart rate and blood pressure. °Body temperature. °Skin for abnormal spots. °What immunizations do I need? °Vaccines are usually given at various ages, according to a schedule. Your health care provider will recommend vaccines for you based on your age, medical history, and lifestyle or other factors, such as travel or where you work. °What tests do I need? °Screening °Your health care provider may recommend screening tests for certain conditions. This may include: °Lipid and cholesterol levels. °Diabetes screening. This is done by checking your blood sugar (glucose) after you have not eaten for a while (fasting). °Hepatitis C test. °Hepatitis B test. °HIV (human  immunodeficiency virus) test. °STI (sexually transmitted infection) testing, if you are at risk. °Lung cancer screening. °Colorectal cancer screening. °Prostate cancer screening. °Abdominal aortic aneurysm (AAA) screening. You may need this if you are a current or former smoker. °Talk with your health care provider about your test results, treatment options, and if necessary, the need for more tests. °Follow these instructions at home: °Eating and drinking ° °Eat a diet that includes fresh fruits and vegetables, whole grains, lean protein, and low-fat dairy products. Limit your intake of foods with high amounts of sugar, saturated fats, and salt. °Take vitamin and mineral supplements as recommended by your health care provider. °Do not drink alcohol if your health care provider tells you not to drink. °If you drink alcohol: °Limit how much you have to 0-2 drinks a day. °Know how much alcohol is in your drink. In the [place] one drink equals one 12 oz bottle of beer (355 mL), one 5 oz glass of wine (148 mL), or one 1½ oz glass of hard liquor (44 mL). °Lifestyle °Brush your teeth every morning and night with fluoride toothpaste. Floss one time each day. °Exercise for at least 30 minutes 5 or more days each week. °Do not use any products that contain nicotine or tobacco. These products include cigarettes, chewing tobacco, and vaping devices, such as e-cigarettes. If you need help quitting, ask your health care provider. °Do not use drugs. °If you are sexually active, practice safe sex. Use a condom or other form of protection to prevent STIs. °Take aspirin only as told by your health care provider. Make sure that you understand how much to take and what form to take. Work with your health care provider to find out whether it is safe and   beneficial for you to take aspirin daily. °Ask your health care provider if you need to take a cholesterol-lowering medicine (statin). °Find healthy ways to manage stress, such  as: °Meditation, yoga, or listening to music. °Journaling. °Talking to a trusted person. °Spending time with friends and family. °Safety °Always wear your seat belt while driving or riding in a vehicle. °Do not drive: °If you have been drinking alcohol. Do not ride with someone who has been drinking. °When you are tired or distracted. °While texting. °If you have been using any mind-altering substances or drugs. °Wear a helmet and other protective equipment during sports activities. °If you have firearms in your house, make sure you follow all gun safety procedures. °Minimize exposure to UV radiation to reduce your risk of skin cancer. °What's next? °Visit your health care provider once a year for an annual wellness visit. °Ask your health care provider how often you should have your eyes and teeth checked. °Stay up to date on all vaccines. °This information is not intended to replace advice given to you by your health care provider. Make sure you discuss any questions you have with your health care provider. °Document Revised: 01/29/2021 Document Reviewed: 01/29/2021 °[employer]vier Patient Education © 2022 [employer] ° °

## 2021-11-07 NOTE — Progress Notes (Signed)
? ?Subjective:  ? Joseph Collier is a 71 y.o. male who presents for Medicare Annual/Subsequent preventive examination. ? ?Review of Systems    ?General:   No F/C, wt loss ?Pulm:   No DIB, SOB, pleuritic chest pain ?Card:  No CP, palpitations ?Abd:  No n/v/d or pain ?MSK: +neck and shoulder pain ?Ext:  No inc edema from baseline ?   ?Objective:  ?  ?Today's Vitals  ? 11/07/21 0846 11/07/21 0908  ?BP: (!) 166/88 (!) 138/98  ?Pulse: 86   ?Temp: 97.6 ?F (36.4 ?C)   ?SpO2: 98%   ?Weight: 242 lb (109.8 kg)   ?Height: '5\' 8"'$  (1.727 m)   ? ?Body mass index is 36.8 kg/m?. ? ? ?  01/20/2017  ?  8:33 AM 02/27/2016  ?  1:23 PM  ?Advanced Directives  ?Does Patient Have a Medical Advance Directive? No No  ?Would patient like information on creating a medical advance directive?  No - patient declined information  ? ? ?Current Medications (verified) ?Outpatient Encounter Medications as of 11/07/2021  ?Medication Sig  ? amLODipine (NORVASC) 2.5 MG tablet Take 1 tablet (2.5 mg total) by mouth daily.  ? aspirin EC 81 MG tablet Take 81 mg by mouth daily.  ? dapagliflozin propanediol (FARXIGA) 10 MG TABS tablet Take 1 tablet (10 mg total) by mouth daily.  ? Dulaglutide (TRULICITY) 3 LZ/7.6BH SOPN INJECT 3 MG  ONCE A WEEK  ? fluticasone (FLONASE) 50 MCG/ACT nasal spray Place 2 sprays into both nostrils daily.  ? glipiZIDE (GLUCOTROL) 10 MG tablet TAKE 1 TABLET BY MOUTH TWICE DAILY BEFORE A MEAL  ? glucose blood test strip Use as instructed  ? lisinopril (ZESTRIL) 40 MG tablet Take 1 tablet (40 mg total) by mouth daily.  ? metFORMIN (GLUCOPHAGE) 1000 MG tablet TAKE 1 TABLET BY MOUTH IN THE MORNING AND TAKE 1 TABLET IN THE EVENING WITH MEALS  ? omeprazole (PRILOSEC) 20 MG capsule Take 1 capsule (20 mg total) by mouth daily.  ? sildenafil (REVATIO) 20 MG tablet TAKE ONE TO THREE TABLETS BY MOUTH PRIOR TO INTERCOURSE AS NEEDED  ? simvastatin (ZOCOR) 20 MG tablet Take 1 tablet (20 mg total) by mouth at bedtime.  ? ?No facility-administered  encounter medications on file as of 11/07/2021.  ? ? ?Allergies (verified) ?Penicillins, Saxagliptin, and Tetracyclines & related  ? ?History: ?Past Medical History:  ?Diagnosis Date  ? Allergic rhinitis, cause unspecified   ? Anemia, unspecified   ? Arthritis   ? Cancer ([place]) 08/17/2006  ? Melanoma  ? Essential hypertension, benign   ? Impotence of organic origin 04/14/2007  ? Obesity, unspecified   ? Other and unspecified hyperlipidemia   ? Personal history of colonic polyps   ? Skin lesion 04/14/2007  ? melanoma  ? ?Past Surgical History:  ?Procedure Laterality Date  ? APPENDECTOMY    ? arthroscopy of knee  2008  ? Left Knee  ? COLONOSCOPY  08/18/2007  ? normal. [name].  ? HERNIA REPAIR    ? age 79  ? JOINT REPLACEMENT    ? melanoma of forearm resection  1992  ? Left        [name]  ? TONSILLECTOMY AND ADENOIDECTOMY    ? ?Family History  ?Problem Relation Age of Onset  ? Coronary artery disease Father   ? Hypertension Father   ? Heart disease Father   ?     AMI cause of death  ? Diabetes Father   ? Cancer Mother 57  ?  lung  ? Hypertension Brother   ? Diabetes Brother   ? ?Social History  ? ?Socioeconomic History  ? Marital status: Married  ?  Spouse name: [name]  ? Number of children: 1  ? Years of education: 22  ? Highest education level: Not on file  ?Occupational History  ? Occupation: outside Press photographer  ?  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  Employer: [employer]  ?Tobacco Use  ? Smoking status: Former  ?  Packs/day: 1.00  ?  Years: 25.00  ?  Pack years: 25.00  ?  Types: Cigarettes  ?  Quit date: 08/17/1989  ?  Years since quitting: 32.2  ? Smokeless tobacco: Never  ? Tobacco comments:  ?  quit 1991  ?Vaping Use  ? Vaping Use: Never used  ?Substance and Sexual Activity  ? Alcohol use: Yes  ?  Alcohol/week: 5.0 standard drinks  ?  Types: 5 Cans of beer per week  ?  Comment: moderate: drinks hard liquor 7 drinks per month in 08/2011/4 drinks 4 a week  ? Drug use: No  ? Sexual activity: Yes  ?  Birth  control/protection: Post-menopausal  ?Other Topics Concern  ? Not on file  ?Social History Narrative  ? Marital status:  Married x 25 years, happily, second marriage.   ?    Children:  1 son (43 yo.)  Son in [place], [place].  ?     Lives with wife.  ?     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  ?    Tobacco: none  ?     Alcohol:    3 drinks per night every other night.  ?     Drugs: none  ?     Exercise: Light, walking, 3 times a week.   ?     Caffeine use: carbonated beverages; consumes a minimal amount.   ?     Seatbelt:  Always uses seat belts, no texting.  ?     Guns:  Guns in the home stored in locked cabinet.   ?     Home Safety:  Smoke Alarms in the home.   ?     Education: [employer].  ? ?Social Determinants of Health  ? ?Financial Resource Strain: Not on file  ?Food Insecurity: Not on file  ?Transportation Needs: Not on file  ?Physical Activity: Not on file  ?Stress: Not on file  ?Social Connections: Not on file  ? ? ?Tobacco Counseling ?Counseling given: Not Answered ?Tobacco comments: quit 1991 ? ? ? ?Diabetic? yes ? ?  ? ? ?Activities of Daily Living ? ?  11/07/2021  ?  8:47 AM 07/08/2021  ?  9:00 AM  ?In your present state of health, do you have any difficulty performing the following activities:  ?Hearing? 0 0  ?Vision? 0 0  ?Difficulty concentrating or making decisions? 0 0  ?Walking or climbing stairs? 0 0  ?Dressing or bathing? 0 0  ?Doing errands, shopping? 0 0  ? ? ?Patient Care Team: ?Lorrene Reid, PA-C as PCP - General ?Gastroenterology, [facility] ? ?Indicate any recent Medical Services you may have received from other than [facility] providers in the past year (date may be approximate). ? ?   ?Assessment:  ? This is a routine wellness examination for [facility]. ? ?Hearing/Vision screen ?No results found. ? ?Dietary issues and exercise activities discussed: ?-Diet low in carbohydrates and sugar including diet sodas. Stays active with his job  and house work. ? ?  Goals Addressed   ?None ?  ?Depression Screen ? ?  11/07/2021  ?  8:47 AM 07/08/2021  ?  9:01 AM 04/03/2021  ?  9:10 AM 01/30/2021  ? 10:22 AM 12/31/2020  ?  9:41 AM 10/02/2020  ?  8:10 AM 05/30/2020  ?  8:09 AM  ?PHQ 2/9 Scores  ?PHQ - 2 Score 0 0 0 0 0 0 0  ?PHQ- 9 Score '5 2 1 '$ 0 1 0 2  ?  ?Fall Risk ? ?  11/07/2021  ?  8:30 AM 07/08/2021  ?  9:00 AM 04/03/2021  ?  9:10 AM 01/30/2021  ? 10:21 AM 12/31/2020  ?  9:40 AM  ?Fall Risk   ?Falls in the past year? 0 0 0 0 0  ?Number falls in past yr: 0 0 0 0 0  ?Injury with Fall? 0 0 0 0 0  ?Risk for fall due to : No Fall Risks No Fall Risks No Fall Risks No Fall Risks No Fall Risks  ?Follow up Falls evaluation completed Falls evaluation completed Falls evaluation completed Falls evaluation completed Falls evaluation completed  ? ? ?FALL RISK PREVENTION PERTAINING TO THE HOME: ? ?Any stairs in or around the home? Yes  ?If so, are there any without handrails? No  ?Home free of loose throw rugs in walkways, pet beds, electrical cords, etc? Yes  ?Adequate lighting in your home to reduce risk of falls? Yes  ? ?ASSISTIVE DEVICES UTILIZED TO PREVENT FALLS: ? ?Life alert? No  ?Use of a cane, walker or w/c? Yes  ?Grab bars in the bathroom? No  ?Shower chair or bench in shower? No  ?Elevated toilet seat or a handicapped toilet? No  ? ?TIMED UP AND GO: ? ?Was the test performed? Yes .  ?Length of time to ambulate 10 feet: 10 sec.  ? ?Gait steady and fast without use of assistive device ? ?Cognitive Function: wnl's ?  ?  ? ?  11/07/2021  ?  8:30 AM 10/02/2020  ?  8:09 AM 06/16/2019  ?  8:14 AM  ?6CIT Screen  ?What Year? 0 points 0 points 0 points  ?What month? 0 points 0 points 0 points  ?What time? 0 points 0 points 0 points  ?Count back from 20 0 points 0 points 0 points  ?Months in reverse 0 points 0 points 0 points  ?Repeat phrase 0 points 0 points 2 points  ?Total Score 0 points 0 points 2 points  ? ? ?Immunizations ?Immunization History  ?Administered Date(s) Administered  ? Fluad  Quad(high Dose 65+) 05/26/2019, 10/02/2020, 07/08/2021  ? Influenza Split 07/18/2007, 04/25/2012  ? Influenza, High Dose Seasonal PF 08/08/2018  ? Influenza,inj,Quad PF,6+ Mos 05/01/2013, 05/09/2014, 05/24/2015, 06/18/2017

## 2021-12-27 ENCOUNTER — Other Ambulatory Visit: Payer: Self-pay | Admitting: Physician Assistant

## 2021-12-27 DIAGNOSIS — E1169 Type 2 diabetes mellitus with other specified complication: Secondary | ICD-10-CM

## 2022-01-05 ENCOUNTER — Other Ambulatory Visit: Payer: Self-pay | Admitting: Physician Assistant

## 2022-01-05 DIAGNOSIS — E1169 Type 2 diabetes mellitus with other specified complication: Secondary | ICD-10-CM

## 2022-01-06 ENCOUNTER — Encounter: Payer: Self-pay | Admitting: Gastroenterology

## 2022-01-15 ENCOUNTER — Ambulatory Visit (AMBULATORY_SURGERY_CENTER): Payer: Self-pay | Admitting: *Deleted

## 2022-01-15 VITALS — Ht 69.0 in | Wt 242.0 lb

## 2022-01-15 DIAGNOSIS — Z86010 Personal history of colonic polyps: Secondary | ICD-10-CM

## 2022-01-15 MED ORDER — PEG 3350-KCL-NA BICARB-NACL 420 G PO SOLR: 4000.0000 mL | Freq: Once | ORAL | 0 refills | Status: AC

## 2022-01-15 NOTE — Progress Notes (Signed)
No egg or soy allergy known to patient  No issues known to pt with past sedation with any surgeries or procedures Patient denies ever being told they had issues or difficulty with intubation  No FH of Malignant Hyperthermia Pt is not on diet pills Pt is not on  home 02  Pt is not on blood thinners  Pt denies issues with constipation  No A fib or A flutter  Discussed with pt there will be an out-of-pocket cost for prep and that varies from $0 to 70 +  dollars - pt verbalized understanding  Pt instructed to use Singlecare.com or GoodRx for a price reduction on prep  Pt encouraged to call with questions or issues.   Insurance confirmed with pt at [facility] today

## 2022-01-24 ENCOUNTER — Other Ambulatory Visit: Payer: Self-pay | Admitting: Physician Assistant

## 2022-01-24 DIAGNOSIS — K219 Gastro-esophageal reflux disease without esophagitis: Secondary | ICD-10-CM

## 2022-01-24 DIAGNOSIS — E1169 Type 2 diabetes mellitus with other specified complication: Secondary | ICD-10-CM

## 2022-01-24 DIAGNOSIS — I152 Hypertension secondary to endocrine disorders: Secondary | ICD-10-CM

## 2022-02-11 ENCOUNTER — Ambulatory Visit (AMBULATORY_SURGERY_CENTER): Payer: Medicare Other | Admitting: Gastroenterology

## 2022-02-11 ENCOUNTER — Encounter: Payer: Self-pay | Admitting: Gastroenterology

## 2022-02-11 VITALS — BP 145/83 | HR 85 | Temp 96.9°F | Resp 16 | Ht 69.0 in | Wt 242.0 lb

## 2022-02-11 DIAGNOSIS — Z538 Procedure and treatment not carried out for other reasons: Secondary | ICD-10-CM | POA: Diagnosis not present

## 2022-02-11 DIAGNOSIS — Z86010 Personal history of colonic polyps: Secondary | ICD-10-CM

## 2022-02-11 DIAGNOSIS — Z1211 Encounter for screening for malignant neoplasm of colon: Secondary | ICD-10-CM

## 2022-02-11 DIAGNOSIS — Z09 Encounter for follow-up examination after completed treatment for conditions other than malignant neoplasm: Secondary | ICD-10-CM

## 2022-02-11 MED ORDER — SODIUM CHLORIDE 0.9 % IV SOLN: 500.0000 mL | Freq: Once | INTRAVENOUS | Status: DC

## 2022-02-11 MED ORDER — NA SULFATE-K SULFATE-MG SULF 17.5-3.13-1.6 GM/177ML PO SOLN: 1.0000 | Freq: Once | ORAL | 0 refills | Status: AC

## 2022-02-11 NOTE — Progress Notes (Signed)
Patient reschedule for colonoscopy 02/19/22 with 2 day prep.  Instructions reviewed for 2 day prep with Suprep . Prescription sent in

## 2022-02-11 NOTE — Progress Notes (Signed)
Sedate, gd SR, tolerated procedure well, VSS, report to RN 

## 2022-02-11 NOTE — Op Note (Signed)
[place] Patient Name: Joseph Collier Procedure Date: 02/11/2022 9:33 AM MRN: [id number] Endoscopist: Gerrit Heck , MD Age: 71 Referring MD:  Date of Birth: [birth year] Gender: Male Account #: [id number] Procedure:                Colonoscopy Indications:              Surveillance: Personal history of adenomatous                            polyps on last colonoscopy > 5 years ago                           Last colonoscopy was 01/2008 and notable for a 10 mm                            adenoma in sigmoid and 2 subcentimeter adenomas in                            the transverse/ascending colon. Medicines:                Monitored Anesthesia Care Procedure:                Pre-Anesthesia Assessment:                           - Prior to the procedure, a History and Physical                            was performed, and patient medications and                            allergies were reviewed. The patient's tolerance of                            previous anesthesia was also reviewed. The risks                            and benefits of the procedure and the sedation                            options and risks were discussed with the patient.                            All questions were answered, and informed consent                            was obtained. Prior Anticoagulants: The patient has                            taken no previous anticoagulant or antiplatelet                            agents. ASA Grade Assessment: II - A patient with  mild systemic disease. After reviewing the risks                            and benefits, the patient was deemed in                            satisfactory condition to undergo the procedure.                           After obtaining informed consent, the colonoscope                            was passed under direct vision. Throughout the                            procedure, the patient's blood pressure,  pulse, and                            oxygen saturations were monitored continuously. The                            CF [id number] #[id number] was introduced through the anus                            and advanced to the the cecum, identified by                            appendiceal orifice and ileocecal valve. The                            colonoscopy was technically difficult and complex                            due to poor bowel prep. The patient tolerated the                            procedure well. The quality of the bowel                            preparation was poor (Go-Lytely prep). The                            ileocecal valve, appendiceal orifice, and rectum                            were photographed. Scope In: 9:47:39 AM Scope Out: 9:58:04 AM Scope Withdrawal Time: 0 hours 2 minutes 30 seconds  Total Procedure Duration: 0 hours 10 minutes 25 seconds  Findings:                 The perianal and digital rectal examinations were                            normal.  A large amount of semi-liquid stool and solid                            debris was found in the entire colon, precluding                            visualization. Lavage of the area was performed                            using copious amounts of tap water, resulting in                            incomplete clearance with continued poor                            visualization. The limited visualized mucosa was                            normal appearing, but views were significantly                            limited by retained stool and solid debris. Complications:            No immediate complications. Estimated Blood Loss:     Estimated blood loss: none. Impression:               - Preparation of the colon was poor.                           - Stool in the entire examined colon.                           - No specimens collected. Recommendation:           - Patient has a contact  number available for                            emergencies. The signs and symptoms of potential                            delayed complications were discussed with the                            patient. Return to normal activities tomorrow.                            Written discharge instructions were provided to the                            patient.                           - Resume previous diet.                           - Continue present medications.                           -  Repeat colonoscopy at next available appointment                            (within 3 months) because the bowel preparation was                            poor.                           - Recommend extended 2-day bowel preparation with                            repeat colonoscopy. Gerrit Heck, MD 02/11/2022 10:05:47 AM

## 2022-02-11 NOTE — Progress Notes (Signed)
GASTROENTEROLOGY PROCEDURE H&P NOTE   Primary Care Physician: Lorrene Reid, PA-C    Reason for Procedure:  Colon Cancer screening, colon polyp surveillance  Plan:    Colonoscopy  Patient is appropriate for endoscopic procedure(s) in the ambulatory ([facility]) setting.  The nature of the procedure, as well as the risks, benefits, and alternatives were carefully and thoroughly reviewed with the patient. Ample time for discussion and questions allowed. The patient understood, was satisfied, and agreed to proceed.     HPI: Joseph Collier is a 71 y.o. male who presents for colonoscopy for ongoing colon polyp surveillance and Colon Cancer screening.  No active GI symptoms.    Last colonoscopy was 01/2008 and notable for a 10 mm adenoma in sigmoid and 2 subcentimeter adenomas in the transverse/ascending colon.   Past Medical History:  Diagnosis Date   Allergic rhinitis, cause unspecified    Allergy    Anemia, unspecified    Arthritis    Cancer ([place]) 08/17/2006   Melanoma   Diabetes mellitus without complication ([place])    type II   Essential hypertension, benign    GERD (gastroesophageal reflux disease)    Impotence of organic origin 04/14/2007   Obesity, unspecified    Other and unspecified hyperlipidemia    Personal history of colonic polyps    Skin lesion 04/14/2007   melanoma    Past Surgical History:  Procedure Laterality Date   APPENDECTOMY     arthroscopy of knee  08/17/2006   Left Knee   COLONOSCOPY  08/18/2007   normal. [name].   HERNIA REPAIR     age 22   melanoma of forearm resection  08/17/1990   Left        [name]   POLYPECTOMY     2009- TA +   REPLACEMENT TOTAL KNEE Left    TONSILLECTOMY AND ADENOIDECTOMY      Prior to Admission medications   Medication Sig Start Date End Date Taking? Authorizing Provider  amLODipine (NORVASC) 2.5 MG tablet Take 1 tablet by mouth once daily 01/26/22  Yes Abonza, Maritza, PA-C  aspirin EC 81 MG tablet Take 81 mg by mouth  daily.   Yes [provider]  FARXIGA 10 MG TABS tablet Take 1 tablet by mouth once daily 01/26/22  Yes Abonza, Maritza, PA-C  fluticasone (FLONASE) 50 MCG/ACT nasal spray Place 2 sprays into both nostrils daily.   Yes [provider]  glipiZIDE (GLUCOTROL) 10 MG tablet TAKE 1 TABLET BY MOUTH TWICE DAILY BEFORE A MEAL 12/29/21  Yes Abonza, Maritza, PA-C  glucose blood test strip Use as instructed 01/29/17  Yes Opalski, Deborah, DO  lisinopril (ZESTRIL) 40 MG tablet Take 1 tablet by mouth once daily 01/26/22  Yes Abonza, Maritza, PA-C  metFORMIN (GLUCOPHAGE) 1000 MG tablet TAKE 1 TABLET BY MOUTH IN THE MORNING AND 1 IN THE EVENING WITH MEALS 01/26/22  Yes Abonza, Maritza, PA-C  omeprazole (PRILOSEC) 20 MG capsule Take 1 capsule by mouth once daily 01/26/22  Yes Abonza, Maritza, PA-C  simvastatin (ZOCOR) 20 MG tablet TAKE 1 TABLET BY MOUTH AT BEDTIME 01/06/22  Yes Abonza, Maritza, PA-C  Dulaglutide (TRULICITY) 3 VO/1.6WV SOPN INJECT 3 MG  ONCE A WEEK 07/08/21   Abonza, Maritza, PA-C  Multiple Vitamin (MULTIVITAMIN) tablet Take 1 tablet by mouth daily.    [provider]  sildenafil (REVATIO) 20 MG tablet TAKE ONE TO THREE TABLETS BY MOUTH PRIOR TO INTERCOURSE AS NEEDED 07/28/21   Lorrene Reid, PA-C    Current  Outpatient Medications  Medication Sig Dispense Refill   amLODipine (NORVASC) 2.5 MG tablet Take 1 tablet by mouth once daily 90 tablet 0   aspirin EC 81 MG tablet Take 81 mg by mouth daily.     FARXIGA 10 MG TABS tablet Take 1 tablet by mouth once daily 90 tablet 0   fluticasone (FLONASE) 50 MCG/ACT nasal spray Place 2 sprays into both nostrils daily.     glipiZIDE (GLUCOTROL) 10 MG tablet TAKE 1 TABLET BY MOUTH TWICE DAILY BEFORE A MEAL 180 tablet 0   glucose blood test strip Use as instructed 100 each 12   lisinopril (ZESTRIL) 40 MG tablet Take 1 tablet by mouth once daily 90 tablet 0   metFORMIN (GLUCOPHAGE) 1000 MG tablet TAKE 1 TABLET BY MOUTH IN THE MORNING AND  1 IN THE EVENING WITH MEALS 180 tablet 0   omeprazole (PRILOSEC) 20 MG capsule Take 1 capsule by mouth once daily 90 capsule 0   simvastatin (ZOCOR) 20 MG tablet TAKE 1 TABLET BY MOUTH AT BEDTIME 90 tablet 0   Dulaglutide (TRULICITY) 3 CB/7.6EG SOPN INJECT 3 MG  ONCE A WEEK 12 mL 1   Multiple Vitamin (MULTIVITAMIN) tablet Take 1 tablet by mouth daily.     sildenafil (REVATIO) 20 MG tablet TAKE ONE TO THREE TABLETS BY MOUTH PRIOR TO INTERCOURSE AS NEEDED 30 tablet 0   Current Facility-Administered Medications  Medication Dose Route Frequency Provider Last Rate Last Admin   0.9 %  sodium chloride infusion  500 mL Intravenous Once Chasitie Passey V, DO        Allergies as of 02/11/2022 - Review Complete 02/11/2022  Allergen Reaction Noted   Penicillins Hives and Swelling 04/14/2007   Saxagliptin  09/15/2019   Tetracyclines & related Hives and Swelling 04/14/2007    Family History  Problem Relation Age of Onset   Cancer Mother 23       lung   Coronary artery disease Father    Hypertension Father    Heart disease Father        AMI cause of death   Diabetes Father    Hypertension Brother    Diabetes Brother    Colon cancer Neg Hx    Colon polyps Neg Hx    Esophageal cancer Neg Hx    Rectal cancer Neg Hx    Stomach cancer Neg Hx     Social History   Socioeconomic History   Marital status: Married    Spouse name: Programmer, multimedia   Number of children: 1   Years of education: 12   Highest education level: Not on file  Occupational History   Occupation: outside Press photographer    Comment: engineered woods since 2000    Employer: [employer]  Tobacco Use   Smoking status: Former    Packs/day: 1.00    Years: 25.00    Total pack years: 25.00    Types: Cigarettes    Quit date: 08/17/1989    Years since quitting: 32.5   Smokeless tobacco: Never   Tobacco comments:    quit 1991  Vaping Use   Vaping Use: Never used  Substance and Sexual Activity   Alcohol use: Yes    Alcohol/week: 5.0  standard drinks of alcohol    Types: 5 Cans of beer per week    Comment: moderate: drinks hard liquor 7 drinks per month in 08/2011/4 drinks 4 a week   Drug use: No   Sexual activity: Yes    Birth control/protection: Post-menopausal  Other Topics Concern   Not on file  Social History Narrative   Marital status:  Married x 25 years, happily, second marriage.       Children:  1 son (3 yo.)  Son in [place], [place].       Lives with wife.       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      Tobacco: none       Alcohol:    3 drinks per night every other night.       Drugs: none       Exercise: Light, walking, 3 times a week.        Caffeine use: carbonated beverages; consumes a minimal amount.        Seatbelt:  Always uses seat belts, no texting.       Guns:  Guns in the home stored in locked cabinet.        Home Safety:  Smoke Alarms in the home.        Education: [employer].   Social Determinants of Health   Financial Resource Strain: Not on file  Food Insecurity: Not on file  Transportation Needs: Not on file  Physical Activity: Not on file  Stress: Not on file  Social Connections: Not on file  Intimate Partner Violence: Not on file    Physical Exam: Vital signs in last 24 hours: '@BP'$  (!) 151/100   Pulse 91   Temp (!) 96.9 F (36.1 C) (Temporal)   Ht '5\' 9"'$  (1.753 m)   Wt 242 lb (109.8 kg)   SpO2 99%   BMI 35.74 kg/m  GEN: NAD EYE: Sclerae anicteric ENT: MMM CV: Non-tachycardic Pulm: CTA b/l GI: Soft, NT/ND NEURO:  Alert & Oriented x 3   Gerrit Heck, DO [facility]   02/11/2022 9:37 AM

## 2022-02-11 NOTE — Progress Notes (Signed)
VS completed by CW.   Pt's states no medical or surgical changes since previsit or office visit.  

## 2022-02-12 ENCOUNTER — Telehealth: Payer: Self-pay | Admitting: *Deleted

## 2022-02-12 NOTE — Telephone Encounter (Signed)
  Follow up Call-     02/11/2022    8:44 AM  Call back number  Post procedure Call Back phone  # [phone]  Permission to leave phone message Yes     Patient questions:  Do you have a fever, pain , or abdominal swelling? No. Pain Score  0 *  Have you tolerated food without any problems? Yes.    Have you been able to return to your normal activities? Yes.    Do you have any questions about your discharge instructions: Diet   No. Medications  No. Follow up visit  No.  Do you have questions or concerns about your Care? No.  Actions: * If pain score is 4 or above: No action needed, pain <4.

## 2022-02-19 ENCOUNTER — Encounter: Payer: Self-pay | Admitting: Gastroenterology

## 2022-02-19 ENCOUNTER — Ambulatory Visit (AMBULATORY_SURGERY_CENTER): Payer: Medicare Other | Admitting: Gastroenterology

## 2022-02-19 VITALS — BP 124/69 | HR 96 | Temp 99.3°F | Resp 16 | Ht 69.0 in | Wt 242.0 lb

## 2022-02-19 DIAGNOSIS — Z09 Encounter for follow-up examination after completed treatment for conditions other than malignant neoplasm: Secondary | ICD-10-CM | POA: Diagnosis not present

## 2022-02-19 DIAGNOSIS — D122 Benign neoplasm of ascending colon: Secondary | ICD-10-CM

## 2022-02-19 DIAGNOSIS — Z86010 Personal history of colonic polyps: Secondary | ICD-10-CM | POA: Diagnosis not present

## 2022-02-19 DIAGNOSIS — K5730 Diverticulosis of large intestine without perforation or abscess without bleeding: Secondary | ICD-10-CM | POA: Diagnosis not present

## 2022-02-19 DIAGNOSIS — D125 Benign neoplasm of sigmoid colon: Secondary | ICD-10-CM

## 2022-02-19 DIAGNOSIS — E119 Type 2 diabetes mellitus without complications: Secondary | ICD-10-CM | POA: Diagnosis not present

## 2022-02-19 DIAGNOSIS — I10 Essential (primary) hypertension: Secondary | ICD-10-CM | POA: Diagnosis not present

## 2022-02-19 MED ORDER — SODIUM CHLORIDE 0.9 % IV SOLN: 500.0000 mL | Freq: Once | INTRAVENOUS | Status: DC

## 2022-02-19 NOTE — Patient Instructions (Signed)
Await pathology results.  Handout on polyps and diverticulosis given.  YOU HAD AN ENDOSCOPIC PROCEDURE TODAY AT THE [facility]:   Refer to the procedure report that was given to you for any specific questions about what was found during the examination.  If the procedure report does not answer your questions, please call your gastroenterologist to clarify.  If you requested that your care partner not be given the details of your procedure findings, then the procedure report has been included in a sealed envelope for you to review at your convenience later.  YOU SHOULD EXPECT: Some feelings of bloating in the abdomen. Passage of more gas than usual.  Walking can help get rid of the air that was put into your GI tract during the procedure and reduce the bloating. If you had a lower endoscopy (such as a colonoscopy or flexible sigmoidoscopy) you may notice spotting of blood in your stool or on the toilet paper. If you underwent a bowel prep for your procedure, you may not have a normal bowel movement for a few days.  Please Note:  You might notice some irritation and congestion in your nose or some drainage.  This is from the oxygen used during your procedure.  There is no need for concern and it should clear up in a day or so.  SYMPTOMS TO REPORT IMMEDIATELY:  Following lower endoscopy (colonoscopy or flexible sigmoidoscopy):  Excessive amounts of blood in the stool  Significant tenderness or worsening of abdominal pains  Swelling of the abdomen that is new, acute  Fever of 100F or higher  For urgent or emergent issues, a gastroenterologist can be reached at any hour by calling [phone]. Do not use MyChart messaging for urgent concerns.    DIET:  We do recommend a small meal at first, but then you may proceed to your regular diet.  Drink plenty of fluids but you should avoid alcoholic beverages for 24 hours.  ACTIVITY:  You should plan to take it easy for the rest of today  and you should NOT DRIVE or use heavy machinery until tomorrow (because of the sedation medicines used during the test).    FOLLOW UP: Our staff will call the number listed on your records the next business day following your procedure.  We will call around 7:15- 8:00 am to check on you and address any questions or concerns that you may have regarding the information given to you following your procedure. If we do not reach you, we will leave a message.  If you develop any symptoms (ie: fever, flu-like symptoms, shortness of breath, cough etc.) before then, please call [phone].  If you test positive for Covid 19 in the 2 weeks post procedure, please call and report this information to us.    If any biopsies were taken you will be contacted by phone or by letter within the next 1-3 weeks.  Please call us at [phone] if you have not heard about the biopsies in 3 weeks.    SIGNATURES/CONFIDENTIALITY: You and/or your care partner have signed paperwork which will be entered into your electronic medical record.  These signatures attest to the fact that that the information above on your After Visit Summary has been reviewed and is understood.  Full responsibility of the confidentiality of this discharge information lies with you and/or your care-partner.  

## 2022-02-19 NOTE — Op Note (Signed)
[facility] Patient Name: Joseph Collier Procedure Date: 02/19/2022 2:53 PM MRN: [id number] Endoscopist: Gerrit Heck , MD Age: 71 Referring MD:  Date of Birth: 1951-03-16 Gender: Male Account #: [id number] Procedure:                Colonoscopy Indications:              Surveillance: Personal history of adenomatous                            polyps on previous colonoscopy.                           Colonoscopy in 01/2008 notable for a 10 mm adenoma                            in sigmoid and 2 subcentimeter adenomas in the                            transverse/ascending colon.                           Colonoscopy on 02/11/2022 with large amount of                            retained stool and poor visualization. He presents                            today for repeat colonoscopy for completion of                            colon cancer screening and polyp surveillance after                            completing extended 2-day bowel preparation. Medicines:                Monitored Anesthesia Care Procedure:                Pre-Anesthesia Assessment:                           - Prior to the procedure, a History and Physical                            was performed, and patient medications and                            allergies were reviewed. The patient's tolerance of                            previous anesthesia was also reviewed. The risks                            and benefits of the procedure and the sedation  options and risks were discussed with the patient.                            All questions were answered, and informed consent                            was obtained. Prior Anticoagulants: The patient has                            taken no previous anticoagulant or antiplatelet                            agents. ASA Grade Assessment: II - A patient with                            mild systemic disease. After reviewing the risks                             and benefits, the patient was deemed in                            satisfactory condition to undergo the procedure.                           After obtaining informed consent, the colonoscope                            was passed under direct vision. Throughout the                            procedure, the patient's blood pressure, pulse, and                            oxygen saturations were monitored continuously. The                            Olympus Colonoscope [id number] was introduced through                            the anus and advanced to the the terminal ileum.                            The colonoscopy was performed without difficulty.                            The patient tolerated the procedure well. The                            quality of the bowel preparation was good. The                            terminal ileum, ileocecal valve, appendiceal  orifice, and rectum were photographed. Scope In: 3:02:45 PM Scope Out: 3:31:26 PM Scope Withdrawal Time: 0 hours 22 minutes 34 seconds  Total Procedure Duration: 0 hours 28 minutes 41 seconds  Findings:                 The perianal and digital rectal examinations were                            normal.                           Two semi-sessile polyps were found in the ascending                            colon. The polyps were 6 to 12 mm in size. These                            polyps were removed with a cold snare. Resection                            and retrieval were complete. Estimated blood loss                            was minimal.                           A 4 mm polyp was found in the sigmoid colon. The                            polyp was sessile. The polyp was removed with a                            cold snare. Resection and retrieval were complete.                            Estimated blood loss was minimal.                           A few small-mouthed diverticula were  found in the                            transverse colon.                           The retroflexed view of the distal rectum and anal                            verge was normal and showed no anal or rectal                            abnormalities.                           The terminal ileum appeared normal. Complications:  No immediate complications. Estimated Blood Loss:     Estimated blood loss was minimal. Impression:               - Two 6 to 12 mm polyps in the ascending colon,                            removed with a cold snare. Resected and retrieved.                           - One 4 mm polyp in the sigmoid colon, removed with                            a cold snare. Resected and retrieved.                           - Diverticulosis in the transverse colon.                           - The distal rectum and anal verge are normal on                            retroflexion view.                           - The examined portion of the ileum was normal. Recommendation:           - Patient has a contact number available for                            emergencies. The signs and symptoms of potential                            delayed complications were discussed with the                            patient. Return to normal activities tomorrow.                            Written discharge instructions were provided to the                            patient.                           - Resume previous diet.                           - Continue present medications.                           - Await pathology results.                           - Repeat colonoscopy for surveillance based on  pathology results.                           - Return to GI office PRN. Gerrit Heck, MD 02/19/2022 3:38:35 PM

## 2022-02-19 NOTE — Progress Notes (Signed)
GASTROENTEROLOGY PROCEDURE H&P NOTE   Primary Care Physician: Lorrene Reid, PA-C    Reason for Procedure:  Colon Cancer screening, colon polyp surveillance  Plan:    Colonoscopy  Patient is appropriate for endoscopic procedure(s) in the ambulatory ([facility]) setting.  The nature of the procedure, as well as the risks, benefits, and alternatives were carefully and thoroughly reviewed with the patient. Ample time for discussion and questions allowed. The patient understood, was satisfied, and agreed to proceed.     HPI: Joseph Collier is a 71 y.o. male who presents for colonoscopy for routine Colon Cancer screening, colon polyp surveillance.  No active GI symptoms.    Colonoscopy on 02/11/2022 with large amount of retained stool and poor visualization.  He presents today for repeat colonoscopy for completion of colon cancer screening and polyp surveillance after completing extended 2-day bowel preparation.  Prior to that, colonoscopy was 01/2008 and notable for a 10 mm adenoma in sigmoid and 2 subcentimeter adenomas in the transverse/ascending colon.   Past Medical History:  Diagnosis Date   Allergic rhinitis, cause unspecified    Allergy    Anemia, unspecified    Arthritis    Cancer ([place]) 08/17/2006   Melanoma   Diabetes mellitus without complication ([place])    type II   Essential hypertension, benign    GERD (gastroesophageal reflux disease)    Impotence of organic origin 04/14/2007   Obesity, unspecified    Other and unspecified hyperlipidemia    Personal history of colonic polyps    Skin lesion 04/14/2007   melanoma    Past Surgical History:  Procedure Laterality Date   APPENDECTOMY     arthroscopy of knee  08/17/2006   Left Knee   COLONOSCOPY  08/18/2007   normal. [name].   HERNIA REPAIR     age 5   melanoma of forearm resection  08/17/1990   Left        [name]   POLYPECTOMY     2009- TA +   REPLACEMENT TOTAL KNEE Left    TONSILLECTOMY AND ADENOIDECTOMY       Prior to Admission medications   Medication Sig Start Date End Date Taking? Authorizing Provider  amLODipine (NORVASC) 2.5 MG tablet Take 1 tablet by mouth once daily 01/26/22   Lorrene Reid, PA-C  aspirin EC 81 MG tablet Take 81 mg by mouth daily.    [provider]  Dulaglutide (TRULICITY) 3 YQ/6.5HQ SOPN INJECT 3 MG  ONCE A WEEK 07/08/21   Abonza, Maritza, PA-C  FARXIGA 10 MG TABS tablet Take 1 tablet by mouth once daily 01/26/22   Abonza, Maritza, PA-C  fluticasone (FLONASE) 50 MCG/ACT nasal spray Place 2 sprays into both nostrils daily.    [provider]  glipiZIDE (GLUCOTROL) 10 MG tablet TAKE 1 TABLET BY MOUTH TWICE DAILY BEFORE A MEAL 12/29/21   Abonza, Maritza, PA-C  glucose blood test strip Use as instructed 01/29/17   Opalski, Neoma Laming, DO  lisinopril (ZESTRIL) 40 MG tablet Take 1 tablet by mouth once daily 01/26/22   Abonza, Maritza, PA-C  metFORMIN (GLUCOPHAGE) 1000 MG tablet TAKE 1 TABLET BY MOUTH IN THE MORNING AND 1 IN THE EVENING WITH MEALS 01/26/22   Lorrene Reid, PA-C  Multiple Vitamin (MULTIVITAMIN) tablet Take 1 tablet by mouth daily.    [provider]  omeprazole (PRILOSEC) 20 MG capsule Take 1 capsule by mouth once daily 01/26/22   Abonza, Maritza, PA-C  sildenafil (REVATIO) 20 MG tablet TAKE ONE TO THREE TABLETS  BY MOUTH PRIOR TO INTERCOURSE AS NEEDED 07/28/21   Lorrene Reid, PA-C  simvastatin (ZOCOR) 20 MG tablet TAKE 1 TABLET BY MOUTH AT BEDTIME 01/06/22   Lorrene Reid, PA-C    Current Outpatient Medications  Medication Sig Dispense Refill   amLODipine (NORVASC) 2.5 MG tablet Take 1 tablet by mouth once daily 90 tablet 0   aspirin EC 81 MG tablet Take 81 mg by mouth daily.     Dulaglutide (TRULICITY) 3 WS/5.6CL SOPN INJECT 3 MG  ONCE A WEEK 12 mL 1   FARXIGA 10 MG TABS tablet Take 1 tablet by mouth once daily 90 tablet 0   fluticasone (FLONASE) 50 MCG/ACT nasal spray Place 2 sprays into both nostrils daily.     glipiZIDE  (GLUCOTROL) 10 MG tablet TAKE 1 TABLET BY MOUTH TWICE DAILY BEFORE A MEAL 180 tablet 0   glucose blood test strip Use as instructed 100 each 12   lisinopril (ZESTRIL) 40 MG tablet Take 1 tablet by mouth once daily 90 tablet 0   metFORMIN (GLUCOPHAGE) 1000 MG tablet TAKE 1 TABLET BY MOUTH IN THE MORNING AND 1 IN THE EVENING WITH MEALS 180 tablet 0   Multiple Vitamin (MULTIVITAMIN) tablet Take 1 tablet by mouth daily.     omeprazole (PRILOSEC) 20 MG capsule Take 1 capsule by mouth once daily 90 capsule 0   sildenafil (REVATIO) 20 MG tablet TAKE ONE TO THREE TABLETS BY MOUTH PRIOR TO INTERCOURSE AS NEEDED 30 tablet 0   simvastatin (ZOCOR) 20 MG tablet TAKE 1 TABLET BY MOUTH AT BEDTIME 90 tablet 0   No current facility-administered medications for this visit.    Allergies as of 02/19/2022 - Review Complete 02/11/2022  Allergen Reaction Noted   Penicillins Hives and Swelling 04/14/2007   Saxagliptin  09/15/2019   Tetracyclines & related Hives and Swelling 04/14/2007    Family History  Problem Relation Age of Onset   Cancer Mother 43       lung   Coronary artery disease Father    Hypertension Father    Heart disease Father        AMI cause of death   Diabetes Father    Hypertension Brother    Diabetes Brother    Colon cancer Neg Hx    Colon polyps Neg Hx    Esophageal cancer Neg Hx    Rectal cancer Neg Hx    Stomach cancer Neg Hx     Social History   Socioeconomic History   Marital status: Married    Spouse name: Programmer, multimedia   Number of children: 1   Years of education: 12   Highest education level: Not on file  Occupational History   Occupation: outside Press photographer    Comment: engineered woods since 2000    Employer: [employer]  Tobacco Use   Smoking status: Former    Packs/day: 1.00    Years: 25.00    Total pack years: 25.00    Types: Cigarettes    Quit date: 08/17/1989    Years since quitting: 32.5   Smokeless tobacco: Never   Tobacco comments:    quit 1991  Vaping  Use   Vaping Use: Never used  Substance and Sexual Activity   Alcohol use: Yes    Alcohol/week: 5.0 standard drinks of alcohol    Types: 5 Cans of beer per week    Comment: moderate: drinks hard liquor 7 drinks per month in 08/2011/4 drinks 4 a week   Drug use: No  Sexual activity: Yes    Birth control/protection: Post-menopausal  Other Topics Concern   Not on file  Social History Narrative   Marital status:  Married x 25 years, happily, second marriage.       Children:  1 son (69 yo.)  Son in [place], [place].       Lives with wife.       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      Tobacco: none       Alcohol:    3 drinks per night every other night.       Drugs: none       Exercise: Light, walking, 3 times a week.        Caffeine use: carbonated beverages; consumes a minimal amount.        Seatbelt:  Always uses seat belts, no texting.       Guns:  Guns in the home stored in locked cabinet.        Home Safety:  Smoke Alarms in the home.        Education: [employer].   Social Determinants of Health   Financial Resource Strain: Not on file  Food Insecurity: Not on file  Transportation Needs: Not on file  Physical Activity: Not on file  Stress: Not on file  Social Connections: Not on file  Intimate Partner Violence: Not on file    Physical Exam: Vital signs in last 24 hours: '@There'$  were no vitals taken for this visit. GEN: NAD EYE: Sclerae anicteric ENT: MMM CV: Non-tachycardic Pulm: CTA b/l GI: Soft, NT/ND NEURO:  Alert & Oriented x 3   Gerrit Heck, DO [facility]   02/19/2022 2:49 PM

## 2022-02-19 NOTE — Progress Notes (Signed)
Report to PACU, RN, vss, BBS= Clear.  

## 2022-02-19 NOTE — Progress Notes (Signed)
Called to room to assist during endoscopic procedure.  Patient ID and intended procedure confirmed with present staff. Received instructions for my participation in the procedure from the performing physician.  

## 2022-02-20 ENCOUNTER — Telehealth: Payer: Self-pay | Admitting: *Deleted

## 2022-02-20 NOTE — Telephone Encounter (Signed)
Post procedure follow up call placed, no answer and left VM.  

## 2022-03-05 ENCOUNTER — Encounter: Payer: Self-pay | Admitting: Gastroenterology

## 2022-03-10 ENCOUNTER — Ambulatory Visit (INDEPENDENT_AMBULATORY_CARE_PROVIDER_SITE_OTHER): Payer: Medicare Other | Admitting: Physician Assistant

## 2022-03-10 ENCOUNTER — Encounter: Payer: Self-pay | Admitting: Physician Assistant

## 2022-03-10 VITALS — BP 128/75 | HR 86 | Ht 68.9 in | Wt 231.8 lb

## 2022-03-10 DIAGNOSIS — E1169 Type 2 diabetes mellitus with other specified complication: Secondary | ICD-10-CM | POA: Diagnosis not present

## 2022-03-10 DIAGNOSIS — I152 Hypertension secondary to endocrine disorders: Secondary | ICD-10-CM

## 2022-03-10 DIAGNOSIS — K219 Gastro-esophageal reflux disease without esophagitis: Secondary | ICD-10-CM

## 2022-03-10 DIAGNOSIS — E785 Hyperlipidemia, unspecified: Secondary | ICD-10-CM | POA: Diagnosis not present

## 2022-03-10 DIAGNOSIS — E1159 Type 2 diabetes mellitus with other circulatory complications: Secondary | ICD-10-CM | POA: Diagnosis not present

## 2022-03-10 LAB — POCT GLYCOSYLATED HEMOGLOBIN (HGB A1C): Hemoglobin A1C: 6.3 % — AB (ref 4.0–5.6)

## 2022-03-10 NOTE — Progress Notes (Signed)
Established patient visit   Patient: Joseph Collier   DOB: 09-21-1950   71 y.o. Male  MRN: [id number] Visit Date: 03/10/2022  Chief Complaint  Patient presents with   Diabetes   Follow-up   Subjective    HPI  Patient presents for chronic follow-up.  Diabetes: Pt denies increased urination or thirst. Pt reports medication compliance. One hypoglycemic event when working outside which improved after eating. Checking glucose at home. FBS average <140. States highest reading has been 190.   HTN: Pt denies chest pain, palpitations, dizziness or leg swelling. Taking medication as directed without side effects.   HLD: Pt taking medication as directed without issues. Has worked on eating more chicken and fish.  GERD: Reports acid reflux has been so much better since being on omeprazole 20 mg daily.    Medications: Outpatient Medications Prior to Visit  Medication Sig   amLODipine (NORVASC) 2.5 MG tablet Take 1 tablet by mouth once daily   aspirin EC 81 MG tablet Take 81 mg by mouth daily.   Dulaglutide (TRULICITY) 3 YF/1.1MY SOPN INJECT 3 MG  ONCE A WEEK   FARXIGA 10 MG TABS tablet Take 1 tablet by mouth once daily   fluticasone (FLONASE) 50 MCG/ACT nasal spray Place 2 sprays into both nostrils daily.   glipiZIDE (GLUCOTROL) 10 MG tablet TAKE 1 TABLET BY MOUTH TWICE DAILY BEFORE A MEAL   glucose blood test strip Use as instructed   lisinopril (ZESTRIL) 40 MG tablet Take 1 tablet by mouth once daily   metFORMIN (GLUCOPHAGE) 1000 MG tablet TAKE 1 TABLET BY MOUTH IN THE MORNING AND 1 IN THE EVENING WITH MEALS   Multiple Vitamin (MULTIVITAMIN) tablet Take 1 tablet by mouth daily.   omeprazole (PRILOSEC) 20 MG capsule Take 1 capsule by mouth once daily   sildenafil (REVATIO) 20 MG tablet TAKE ONE TO THREE TABLETS BY MOUTH PRIOR TO INTERCOURSE AS NEEDED   simvastatin (ZOCOR) 20 MG tablet TAKE 1 TABLET BY MOUTH AT BEDTIME   No facility-administered medications prior to visit.    Review  of Systems Review of Systems:  A fourteen system review of systems was performed and found to be positive as per HPI.  Last CBC Lab Results  Component Value Date   WBC 9.0 07/01/2021   HGB 15.5 07/01/2021   HCT 46.3 07/01/2021   MCV 90 07/01/2021   MCH 30.2 07/01/2021   RDW 12.0 07/01/2021   PLT 301 07/03/3566   Last metabolic panel Lab Results  Component Value Date   GLUCOSE 106 (H) 07/01/2021   NA 138 07/01/2021   K 4.4 07/01/2021   CL 100 07/01/2021   CO2 24 07/01/2021   BUN 11 07/01/2021   CREATININE 1.03 07/01/2021   EGFR 79 07/01/2021   CALCIUM 9.5 07/01/2021   PROT 6.7 07/01/2021   ALBUMIN 4.7 07/01/2021   LABGLOB 2.0 07/01/2021   AGRATIO 2.4 (H) 07/01/2021   BILITOT 0.7 07/01/2021   ALKPHOS 52 07/01/2021   AST 19 07/01/2021   ALT 24 07/01/2021   Last lipids Lab Results  Component Value Date   CHOL 108 07/01/2021   HDL 41 07/01/2021   LDLCALC 54 07/01/2021   TRIG 58 07/01/2021   CHOLHDL 2.6 07/01/2021   Last hemoglobin A1c Lab Results  Component Value Date   HGBA1C 6.3 (A) 03/10/2022   Last thyroid functions Lab Results  Component Value Date   TSH 1.770 07/01/2021   Last vitamin D Lab Results  Component Value Date  VD25OH 49.5 06/16/2019     Objective    BP 128/75   Pulse 86   Ht 5' 8.9" (1.75 m)   Wt 231 lb 12.8 oz (105.1 kg)   SpO2 97%   BMI 34.33 kg/m  BP Readings from Last 3 Encounters:  03/10/22 128/75  02/19/22 124/69  02/11/22 (!) 145/83   Wt Readings from Last 3 Encounters:  03/10/22 231 lb 12.8 oz (105.1 kg)  02/19/22 242 lb (109.8 kg)  02/11/22 242 lb (109.8 kg)    Physical Exam  General:  Pleasant and cooperative, appropriate for stated age.  Neuro:  Alert and oriented,  extra-ocular muscles intact  HEENT:  Normocephalic, atraumatic, neck supple  Skin:  no gross rash, warm, pink. Cardiac:  RRR, S1 S2 Respiratory: CTA B/L  Vascular:  Ext warm, no cyanosis apprec.; cap RF less 2 sec. Psych:  No HI/SI, judgement  and insight good, Euthymic mood. Full Affect.   Results for orders placed or performed in visit on 03/10/22  POCT glycosylated hemoglobin (Hb A1C)  Result Value Ref Range   Hemoglobin A1C 6.3 (A) 4.0 - 5.6 %   HbA1c POC (<> result, manual entry)     HbA1c, POC (prediabetic range)     HbA1c, POC (controlled diabetic range)      Assessment & Plan      Problem List Items Addressed This Visit       Cardiovascular and Mediastinum   Hypertension associated with diabetes ([facility]) (Chronic)    -Stable. Continue current medication regimen. Will continue to monitor.      Relevant Orders   CBC w/Diff   Comp Met (CMET)     Digestive   GERD (gastroesophageal reflux disease) (Chronic)    -Improved and controlled. Continue current medication regimen. Will continue to monitor. Discussed food triggers to avoid.         Endocrine   Hyperlipidemia associated with type 2 diabetes mellitus ([facility]) (Chronic)    -Last lipid panel: HDL 41, LDL 54 (at goal<70). -Will repeat lipid panel and hepatic function. -Continue current medication regimen. -Will continue to monitor.      Relevant Orders   Lipid Profile   Type 2 diabetes mellitus with other specified complication ([place]) - Primary    -A1c has improved from 6.8 to 6.3 and at goal. Continue current medication regimen. Discussed hypoglycemia and management. Continue ambulatory glucose monitoring. Recommend to schedule annual diabetic eye exam. Will continue to monitor.      Relevant Orders   POCT glycosylated hemoglobin (Hb A1C) (Completed)    Return in about 4 months (around 07/11/2022) for DM, HTN, HLD.        Lorrene Reid, PA-C  [facility] [phone] (phone) [phone] (fax)  [place]

## 2022-03-10 NOTE — Assessment & Plan Note (Addendum)
-  Last lipid panel: HDL 41, LDL 54 (at goal<70). -Will repeat lipid panel and hepatic function. -Continue current medication regimen. -Will continue to monitor.

## 2022-03-10 NOTE — Assessment & Plan Note (Signed)
-  A1c has improved from 6.8 to 6.3 and at goal. Continue current medication regimen. Discussed hypoglycemia and management. Continue ambulatory glucose monitoring. Recommend to schedule annual diabetic eye exam. Will continue to monitor.

## 2022-03-10 NOTE — Patient Instructions (Signed)

## 2022-03-10 NOTE — Assessment & Plan Note (Signed)
-  Improved and controlled. Continue current medication regimen. Will continue to monitor. Discussed food triggers to avoid.

## 2022-03-10 NOTE — Assessment & Plan Note (Signed)
-  Stable. -Continue current medication regimen.  -Will continue to monitor. 

## 2022-03-11 LAB — COMPREHENSIVE METABOLIC PANEL
ALT: 33 IU/L (ref 0–44)
AST: 24 IU/L (ref 0–40)
Albumin/Globulin Ratio: 2 (ref 1.2–2.2)
Albumin: 4.7 g/dL (ref 3.9–4.9)
Alkaline Phosphatase: 52 IU/L (ref 44–121)
BUN/Creatinine Ratio: 12 (ref 10–24)
BUN: 12 mg/dL (ref 8–27)
Bilirubin Total: 0.9 mg/dL (ref 0.0–1.2)
CO2: 23 mmol/L (ref 20–29)
Calcium: 10 mg/dL (ref 8.6–10.2)
Chloride: 102 mmol/L (ref 96–106)
Creatinine, Ser: 1 mg/dL (ref 0.76–1.27)
Globulin, Total: 2.3 g/dL (ref 1.5–4.5)
Glucose: 121 mg/dL — ABNORMAL HIGH (ref 70–99)
Potassium: 4.2 mmol/L (ref 3.5–5.2)
Sodium: 143 mmol/L (ref 134–144)
Total Protein: 7 g/dL (ref 6.0–8.5)
eGFR: 81 mL/min/{1.73_m2} (ref >59)

## 2022-03-11 LAB — CBC WITH DIFFERENTIAL/PLATELET
Basophils Absolute: 0.1 10*3/uL (ref 0.0–0.2)
Basos: 1 %
EOS (ABSOLUTE): 0.2 10*3/uL (ref 0.0–0.4)
Eos: 2 %
Hematocrit: 47.1 % (ref 37.5–51.0)
Hemoglobin: 15.8 g/dL (ref 13.0–17.7)
Immature Grans (Abs): 0 10*3/uL (ref 0.0–0.1)
Immature Granulocytes: 0 %
Lymphocytes Absolute: 2.5 10*3/uL (ref 0.7–3.1)
Lymphs: 30 %
MCH: 30.3 pg (ref 26.6–33.0)
MCHC: 33.5 g/dL (ref 31.5–35.7)
MCV: 90 fL (ref 79–97)
Monocytes Absolute: 0.8 10*3/uL (ref 0.1–0.9)
Monocytes: 9 %
Neutrophils Absolute: 5 10*3/uL (ref 1.4–7.0)
Neutrophils: 58 %
Platelets: 323 10*3/uL (ref 150–450)
RBC: 5.21 x10E6/uL (ref 4.14–5.80)
RDW: 12.6 % (ref 11.6–15.4)
WBC: 8.6 10*3/uL (ref 3.4–10.8)

## 2022-03-11 LAB — LIPID PANEL
Chol/HDL Ratio: 2.6 ratio (ref 0.0–5.0)
Cholesterol, Total: 94 mg/dL — ABNORMAL LOW (ref 100–199)
HDL: 36 mg/dL — ABNORMAL LOW (ref >39)
LDL Chol Calc (NIH): 40 mg/dL (ref 0–99)
Triglycerides: 88 mg/dL (ref 0–149)
VLDL Cholesterol Cal: 18 mg/dL (ref 5–40)

## 2022-03-20 ENCOUNTER — Other Ambulatory Visit: Payer: Self-pay | Admitting: Physician Assistant

## 2022-03-20 DIAGNOSIS — E1169 Type 2 diabetes mellitus with other specified complication: Secondary | ICD-10-CM

## 2022-03-23 ENCOUNTER — Other Ambulatory Visit: Payer: Self-pay | Admitting: Physician Assistant

## 2022-03-23 DIAGNOSIS — Z794 Long term (current) use of insulin: Secondary | ICD-10-CM

## 2022-03-28 ENCOUNTER — Other Ambulatory Visit: Payer: Self-pay | Admitting: Physician Assistant

## 2022-03-28 DIAGNOSIS — E1169 Type 2 diabetes mellitus with other specified complication: Secondary | ICD-10-CM

## 2022-04-01 ENCOUNTER — Other Ambulatory Visit: Payer: Self-pay | Admitting: Physician Assistant

## 2022-04-01 DIAGNOSIS — E1169 Type 2 diabetes mellitus with other specified complication: Secondary | ICD-10-CM

## 2022-04-21 ENCOUNTER — Other Ambulatory Visit: Payer: Self-pay | Admitting: Physician Assistant

## 2022-04-21 DIAGNOSIS — K219 Gastro-esophageal reflux disease without esophagitis: Secondary | ICD-10-CM

## 2022-04-21 DIAGNOSIS — I152 Hypertension secondary to endocrine disorders: Secondary | ICD-10-CM

## 2022-04-21 DIAGNOSIS — E1169 Type 2 diabetes mellitus with other specified complication: Secondary | ICD-10-CM

## 2022-04-27 DIAGNOSIS — Z23 Encounter for immunization: Secondary | ICD-10-CM | POA: Diagnosis not present

## 2022-06-13 ENCOUNTER — Ambulatory Visit (HOSPITAL_COMMUNITY)
Admission: EM | Admit: 2022-06-13 | Discharge: 2022-06-13 | Disposition: A | Discharge status: Home / Self Care | Payer: Medicare Other | Attending: Internal Medicine | Admitting: Internal Medicine

## 2022-06-13 ENCOUNTER — Encounter (HOSPITAL_COMMUNITY): Payer: Self-pay | Admitting: Emergency Medicine

## 2022-06-13 DIAGNOSIS — H6121 Impacted cerumen, right ear: Secondary | ICD-10-CM | POA: Diagnosis not present

## 2022-06-13 DIAGNOSIS — H60501 Unspecified acute noninfective otitis externa, right ear: Secondary | ICD-10-CM | POA: Diagnosis not present

## 2022-06-13 MED ORDER — CIPROFLOXACIN-DEXAMETHASONE 0.3-0.1 % OT SUSP: 4.0000 [drp] | Freq: Two times a day (BID) | OTIC | 0 refills | Status: AC

## 2022-06-13 NOTE — ED Triage Notes (Signed)
Pt reports right ear pain x 4 days and clear drainage from the right ear x 2 days. Has tried an OTC ear drop for relief.

## 2022-06-13 NOTE — ED Provider Notes (Signed)
[place]    CSN: [id number] Arrival date & time: 06/13/22  1125      History   Chief Complaint Chief Complaint  Patient presents with   Otalgia   Ear Drainage    HPI Joseph Collier is a 71 y.o. male presents urgent care today with pain and drainage to right ear.  Patient states symptoms ongoing x2 weeks but now with discomfort around the ear.  Has tried OTC eardrops with no relief.  States ear feels "clogged up".  He denies any recent fever, chills, congestion.   Past Medical History:  Diagnosis Date   Allergic rhinitis, cause unspecified    Allergy    Anemia, unspecified    Arthritis    Cancer ([place]) 08/17/2006   Melanoma   Diabetes mellitus without complication ([place])    type II   Essential hypertension, benign    GERD (gastroesophageal reflux disease)    Impotence of organic origin 04/14/2007   Obesity, unspecified    Other and unspecified hyperlipidemia    Personal history of colonic polyps    Skin lesion 04/14/2007   melanoma    Patient Active Problem List   Diagnosis Date Noted   Respiratory tract infection due to COVID-19 virus 02/09/2021   High risk medications (not anticoagulants) long-term use 09/15/2019   Health education/counseling 10/18/2018   Elevated lipase 02/08/2018   Physically inactive 12/23/2016   Personal history of colonic polyps 03/07/2016   Allergic rhinitis 03/07/2016   Morbid obesity ([place]) 03/07/2016   History of tobacco abuse- less than 30 pack year history quit 1991 03/07/2016   Family history of cardiovascular disease 05/24/2015   History of colonic polyps 05/24/2015   Type 2 diabetes mellitus with other specified complication ([place]) 49/70/2637   Erectile dysfunction 05/10/2014   History of melanoma 02/05/2014   GERD (gastroesophageal reflux disease) 02/05/2014   Degenerative disc disease, cervical 10/30/2013   Hyperlipidemia associated with type 2 diabetes mellitus ([place]) 09/05/2012   Hypertension associated with  diabetes ([place]) 09/05/2012   History of surgical procedure 10/07/2011    Past Surgical History:  Procedure Laterality Date   APPENDECTOMY     arthroscopy of knee  08/17/2006   Left Knee   COLONOSCOPY  08/18/2007   normal. [name].   HERNIA REPAIR     age 71   melanoma of forearm resection  08/17/1990   Left        [name]   POLYPECTOMY     2009- TA +   REPLACEMENT TOTAL KNEE Left    TONSILLECTOMY AND ADENOIDECTOMY         Home Medications    Prior to Admission medications   Medication Sig Start Date End Date Taking? Authorizing Provider  ciprofloxacin-dexamethasone (CIPRODEX) OTIC suspension Place 4 drops into the right ear 2 (two) times daily for 5 days. 06/13/22 06/18/22 Yes Rudolpho Sevin, NP  amLODipine (NORVASC) 2.5 MG tablet Take 1 tablet by mouth once daily 04/22/22   Lorrene Reid, PA-C  aspirin EC 81 MG tablet Take 81 mg by mouth daily.    [provider]  Dulaglutide (TRULICITY) 3 CH/8.8FO SOPN INJECT '3MG'$  INTO THE SKIN ONCE A WEEK 03/24/22   Abonza, Maritza, PA-C  FARXIGA 10 MG TABS tablet Take 1 tablet by mouth once daily 04/22/22   Abonza, Maritza, PA-C  fluticasone (FLONASE) 50 MCG/ACT nasal spray Place 2 sprays into both nostrils daily.    [provider]  glipiZIDE (GLUCOTROL) 10 MG tablet TAKE 1 TABLET BY MOUTH  TWICE DAILY BEFORE A MEAL 03/24/22   Lorrene Reid, PA-C  glucose blood test strip Use as instructed 01/29/17   Opalski, Neoma Laming, DO  lisinopril (ZESTRIL) 40 MG tablet Take 1 tablet by mouth once daily 04/22/22   Abonza, Maritza, PA-C  metFORMIN (GLUCOPHAGE) 1000 MG tablet TAKE 1 TABLET BY MOUTH IN THE MORNING AND 1 TABLET BY MOUTH IN THE EVENING WITH MEALS 04/22/22   Lorrene Reid, PA-C  Multiple Vitamin (MULTIVITAMIN) tablet Take 1 tablet by mouth daily.    [provider]  omeprazole (PRILOSEC) 20 MG capsule Take 1 capsule by mouth once daily 04/22/22   Abonza, Maritza, PA-C  sildenafil (REVATIO) 20 MG tablet TAKE ONE TO THREE TABLETS BY  MOUTH PRIOR TO INTERCOURSE AS NEEDED 07/28/21   Abonza, Maritza, PA-C  simvastatin (ZOCOR) 20 MG tablet TAKE 1 TABLET BY MOUTH AT BEDTIME 04/03/22   Lorrene Reid, PA-C    Family History Family History  Problem Relation Age of Onset   Cancer Mother 89       lung   Coronary artery disease Father    Hypertension Father    Heart disease Father        AMI cause of death   Diabetes Father    Hypertension Brother    Diabetes Brother    Colon cancer Neg Hx    Colon polyps Neg Hx    Esophageal cancer Neg Hx    Rectal cancer Neg Hx    Stomach cancer Neg Hx     Social History Social History   Tobacco Use   Smoking status: Former    Packs/day: 1.00    Years: 25.00    Total pack years: 25.00    Types: Cigarettes    Quit date: 08/17/1989    Years since quitting: 32.8   Smokeless tobacco: Never   Tobacco comments:    quit 1991  Vaping Use   Vaping Use: Never used  Substance Use Topics   Alcohol use: Yes    Alcohol/week: 5.0 standard drinks of alcohol    Types: 5 Cans of beer per week    Comment: moderate: drinks hard liquor 7 drinks per month in 08/2011/4 drinks 4 a week   Drug use: No     Allergies   Penicillins, Saxagliptin, and Tetracyclines & related   Review of Systems As stated in HPI otherwise negative   Physical Exam Triage Vital Signs ED Triage Vitals [06/13/22 1241]  Enc Vitals Group     BP (!) 162/99     Pulse Rate (!) 101     Resp 18     Temp 98.6 F (37 C)     Temp Source Oral     SpO2      Weight      Height      Head Circumference      Peak Flow      Pain Score 4     Pain Loc      Pain Edu?      Excl. in [place]?    No data found.  Updated Vital Signs BP 135/84   Pulse (!) 101   Temp 98.6 F (37 C) (Oral)   Resp 18   Visual Acuity Right Eye Distance:   Left Eye Distance:   Bilateral Distance:    Right Eye Near:   Left Eye Near:    Bilateral Near:     Physical Exam Constitutional:      General: He is not in acute distress.  Appearance: Normal appearance. He is not ill-appearing or toxic-appearing.  HENT:     Left Ear: Tympanic membrane normal.     Ears:     Comments: Mild TTP upon palpation of preauricular region of right ear.  Right ear canal with with mild erythema and swelling, purulent discharge.  Behind discharge appears to be impacted cerumen.  Unable to visualize TM upon initial exam.  Post irrigation continued swelling and erythema to ear canal, TM appears clear without any erythema.    Nose: No congestion or rhinorrhea.  Lymphadenopathy:     Cervical: No cervical adenopathy.  Neurological:     Mental Status: He is alert.      UC Treatments / Results  Labs (all labs ordered are listed, but only abnormal results are displayed) Labs Reviewed - No data to display  EKG   Radiology No results found.  Procedures Procedures (including critical care time)  Medications Ordered in UC Medications - No data to display  Initial Impression / Assessment and Plan / UC Course  I have reviewed the triage vital signs and the nursing notes.  Pertinent labs & imaging results that were available during my care of the patient were reviewed by me and considered in my medical decision making (see chart for details).  Otitis externa Cerumen impaction -s/p irrigation.  No evidence of otitis media.  Ciprodex twice daily x5 days -Follow-up for any persistent or worsening symptoms  History of hypertension -BP recheck upon exam improved -Continue home medications  Reviewed expections re: course of current medical issues. Questions answered. Outlined signs and symptoms indicating need for more acute intervention. Pt verbalized understanding. AVS given  Final Clinical Impressions(s) / UC Diagnoses   Final diagnoses:  Acute otitis externa of right ear, unspecified type     Discharge Instructions      Use antibiotic eardrops as prescribed twice daily for 7 days.  Please return or see your primary care  provider for any worsening or persistent symptoms.     ED Prescriptions     Medication Sig Dispense Auth. Provider   ciprofloxacin-dexamethasone (CIPRODEX) OTIC suspension Place 4 drops into the right ear 2 (two) times daily for 5 days. 7.5 mL Rudolpho Sevin, NP      PDMP not reviewed this encounter.   Rudolpho Sevin, NP 06/13/22 1438

## 2022-06-13 NOTE — Discharge Instructions (Addendum)
Use antibiotic eardrops as prescribed twice daily for 7 days.  Please return or see your primary care provider for any worsening or persistent symptoms.

## 2022-06-14 ENCOUNTER — Other Ambulatory Visit: Payer: Self-pay | Admitting: Physician Assistant

## 2022-06-14 DIAGNOSIS — E1169 Type 2 diabetes mellitus with other specified complication: Secondary | ICD-10-CM

## 2022-06-20 ENCOUNTER — Other Ambulatory Visit: Payer: Self-pay | Admitting: Physician Assistant

## 2022-06-20 DIAGNOSIS — E1169 Type 2 diabetes mellitus with other specified complication: Secondary | ICD-10-CM

## 2022-06-24 ENCOUNTER — Other Ambulatory Visit: Payer: Self-pay | Admitting: Physician Assistant

## 2022-06-24 DIAGNOSIS — E1169 Type 2 diabetes mellitus with other specified complication: Secondary | ICD-10-CM

## 2022-07-13 ENCOUNTER — Ambulatory Visit: Payer: Medicare Other | Admitting: Physician Assistant

## 2022-07-14 ENCOUNTER — Ambulatory Visit (INDEPENDENT_AMBULATORY_CARE_PROVIDER_SITE_OTHER): Payer: Medicare Other | Admitting: Nurse Practitioner

## 2022-07-14 ENCOUNTER — Encounter: Payer: Self-pay | Admitting: Nurse Practitioner

## 2022-07-14 VITALS — BP 133/83 | HR 94 | Resp 18 | Ht 68.9 in | Wt 237.0 lb

## 2022-07-14 DIAGNOSIS — E785 Hyperlipidemia, unspecified: Secondary | ICD-10-CM

## 2022-07-14 DIAGNOSIS — Z8669 Personal history of other diseases of the nervous system and sense organs: Secondary | ICD-10-CM | POA: Diagnosis not present

## 2022-07-14 DIAGNOSIS — E1159 Type 2 diabetes mellitus with other circulatory complications: Secondary | ICD-10-CM

## 2022-07-14 DIAGNOSIS — I152 Hypertension secondary to endocrine disorders: Secondary | ICD-10-CM

## 2022-07-14 DIAGNOSIS — E1169 Type 2 diabetes mellitus with other specified complication: Secondary | ICD-10-CM | POA: Diagnosis not present

## 2022-07-14 LAB — POCT GLYCOSYLATED HEMOGLOBIN (HGB A1C): HbA1c POC (<> result, manual entry): 6.6 % (ref 4.0–5.6)

## 2022-07-14 NOTE — Progress Notes (Signed)
Established patient visit   Patient: Joseph Collier   DOB: 1951/03/19   71 y.o. Male  MRN: [id number] Visit Date: 07/14/2022   Chief Complaint  Patient presents with   Follow-up    Fasting   Diabetes   Hypertension   Hyperlipidemia   Ear Fullness    Right   Subjective    HPI HPI     Follow-up    Additional comments: Fasting        Ear Fullness    Additional comments: Right      Last edited by Gemma Payor, CMA on 07/14/2022 10:38 AM.      Follow up  -type 2 diabetes  -HgbA1c 6.6 today. This is up from 5.9 today.  -taking trulicity 3 mg weekly, farxiga 10 mg daily, glucotrol, and metformin  Blood pressure slightly elevated   Recently treated for otitis externa -required ear lavage and then used ciprodex ear drops for 5 days.  -ear pain is gone.  -sound is still muffled in right ear .    Medications: Outpatient Medications Prior to Visit  Medication Sig   aspirin EC 81 MG tablet Take 81 mg by mouth daily.   Dulaglutide (TRULICITY) 3 UV/2.5DG SOPN INJECT 3 MG INTO THE SKIN ONCE A WEEK   fluticasone (FLONASE) 50 MCG/ACT nasal spray Place 2 sprays into both nostrils daily.   glipiZIDE (GLUCOTROL) 10 MG tablet TAKE 1 TABLET BY MOUTH TWICE DAILY BEFORE A MEAL   glucose blood test strip Use as instructed   Multiple Vitamin (MULTIVITAMIN) tablet Take 1 tablet by mouth daily.   sildenafil (REVATIO) 20 MG tablet TAKE ONE TO THREE TABLETS BY MOUTH PRIOR TO INTERCOURSE AS NEEDED   simvastatin (ZOCOR) 20 MG tablet TAKE 1 TABLET BY MOUTH AT BEDTIME   [DISCONTINUED] amLODipine (NORVASC) 2.5 MG tablet Take 1 tablet by mouth once daily   [DISCONTINUED] FARXIGA 10 MG TABS tablet Take 1 tablet by mouth once daily   [DISCONTINUED] lisinopril (ZESTRIL) 40 MG tablet Take 1 tablet by mouth once daily   [DISCONTINUED] metFORMIN (GLUCOPHAGE) 1000 MG tablet TAKE 1 TABLET BY MOUTH IN THE MORNING AND 1 TABLET BY MOUTH IN THE EVENING WITH MEALS   [DISCONTINUED] omeprazole  (PRILOSEC) 20 MG capsule Take 1 capsule by mouth once daily   No facility-administered medications prior to visit.    Review of Systems  Constitutional:  Negative for activity change, chills, fatigue and fever.  HENT:  Negative for congestion, postnasal drip, rhinorrhea, sinus pressure, sinus pain, sneezing and sore throat.        Ears feel clogged   Eyes: Negative.   Respiratory:  Negative for cough, shortness of breath and wheezing.   Cardiovascular:  Negative for chest pain and palpitations.  Gastrointestinal:  Negative for constipation, diarrhea, nausea and vomiting.  Endocrine: Negative for cold intolerance, heat intolerance, polydipsia and polyuria.       Blood sugars doing well    Genitourinary:  Negative for dysuria, frequency and urgency.  Musculoskeletal:  Negative for back pain and myalgias.  Skin:  Negative for rash.  Allergic/Immunologic: Negative for environmental allergies.  Neurological:  Negative for dizziness, weakness and headaches.  Psychiatric/Behavioral:  The patient is not nervous/anxious.     Last CBC Lab Results  Component Value Date   WBC 8.6 03/10/2022   HGB 15.8 03/10/2022   HCT 47.1 03/10/2022   MCV 90 03/10/2022   MCH 30.3 03/10/2022   RDW 12.6 03/10/2022   PLT 323 03/10/2022   Last  metabolic panel Lab Results  Component Value Date   GLUCOSE 121 (H) 03/10/2022   NA 143 03/10/2022   K 4.2 03/10/2022   CL 102 03/10/2022   CO2 23 03/10/2022   BUN 12 03/10/2022   CREATININE 1.00 03/10/2022   EGFR 81 03/10/2022   CALCIUM 10.0 03/10/2022   PROT 7.0 03/10/2022   ALBUMIN 4.7 03/10/2022   LABGLOB 2.3 03/10/2022   AGRATIO 2.0 03/10/2022   BILITOT 0.9 03/10/2022   ALKPHOS 52 03/10/2022   AST 24 03/10/2022   ALT 33 03/10/2022   Last lipids Lab Results  Component Value Date   CHOL 94 (L) 03/10/2022   HDL 36 (L) 03/10/2022   LDLCALC 40 03/10/2022   TRIG 88 03/10/2022   CHOLHDL 2.6 03/10/2022   Last hemoglobin A1c Lab Results  Component  Value Date   HGBA1C 6.6 07/14/2022   Last thyroid functions Lab Results  Component Value Date   TSH 1.770 07/01/2021   Last vitamin D Lab Results  Component Value Date   VD25OH 49.5 06/16/2019       Objective     Today's Vitals   07/14/22 1036 07/14/22 1105  BP: (Abnormal) 144/87 133/83  Pulse: 93 94  Resp: 18   SpO2: 97%   Weight: 237 lb (107.5 kg)   Height: 5' 8.9" (1.75 m)   PainSc: 0-No pain    Body mass index is 35.1 kg/m.  BP Readings from Last 3 Encounters:  07/14/22 133/83  06/13/22 135/84  03/10/22 128/75    Wt Readings from Last 3 Encounters:  07/14/22 237 lb (107.5 kg)  03/10/22 231 lb 12.8 oz (105.1 kg)  02/19/22 242 lb (109.8 kg)    Physical Exam Vitals and nursing note reviewed.  Constitutional:      Appearance: Normal appearance. He is well-developed.  HENT:     Head: Normocephalic.     Right Ear: Tympanic membrane, ear canal and external ear normal.     Left Ear: Tympanic membrane, ear canal and external ear normal.     Nose: Nose normal.     Mouth/Throat:     Mouth: Mucous membranes are moist.     Pharynx: Oropharynx is clear.  Eyes:     Extraocular Movements: Extraocular movements intact.     Conjunctiva/sclera: Conjunctivae normal.     Pupils: Pupils are equal, round, and reactive to light.  Cardiovascular:     Rate and Rhythm: Normal rate and regular rhythm.     Pulses: Normal pulses.     Heart sounds: Normal heart sounds.  Pulmonary:     Effort: Pulmonary effort is normal.     Breath sounds: Normal breath sounds.  Abdominal:     Palpations: Abdomen is soft.  Musculoskeletal:        General: Normal range of motion.     Cervical back: Normal range of motion and neck supple.  Lymphadenopathy:     Cervical: No cervical adenopathy.  Skin:    General: Skin is warm and dry.     Capillary Refill: Capillary refill takes less than 2 seconds.  Neurological:     General: No focal deficit present.     Mental Status: He is alert and  oriented to person, place, and time.  Psychiatric:        Mood and Affect: Mood normal.        Behavior: Behavior normal.        Thought Content: Thought content normal.        Judgment:  Judgment normal.     Results for orders placed or performed in visit on 07/14/22  POCT HgB A1C  Result Value Ref Range   Hemoglobin A1C     HbA1c POC (<> result, manual entry) 6.6 4.0 - 5.6 %   HbA1c, POC (prediabetic range)     HbA1c, POC (controlled diabetic range)      Assessment & Plan    1. Type 2 diabetes mellitus with other specified complication, unspecified whether long term insulin use (HCC) HgbA1c 6.6 today. Advised patient to limit intake of simple carbohydrates and sugar.  Continue medication as prescribed. Refer for eye exam  - POCT HgB A1C - Ambulatory referral to Ophthalmology  2. H/O: glaucoma Refer for eye exam  - Ambulatory referral to Ophthalmology  3. Hypertension associated with diabetes ([place]) Stable. Continue bp medication as prescribed   4. Hyperlipidemia associated with type 2 diabetes mellitus ([place]) Continue statin as prescribed    Problem List Items Addressed This Visit       Cardiovascular and Mediastinum   Hypertension associated with diabetes ([place]) (Chronic)     Endocrine   Hyperlipidemia associated with type 2 diabetes mellitus ([place]) (Chronic)   Type 2 diabetes mellitus with other specified complication ([place]) - Primary   Relevant Orders   POCT HgB A1C (Completed)   Ambulatory referral to Ophthalmology   Other Visit Diagnoses     H/O: glaucoma       Relevant Orders   Ambulatory referral to Ophthalmology        Return in about 3 months (around 10/14/2022) for diabetes with HgbA1c check, FBW a week prior to visit - will need [place] in 10/2022.         Ronnell Freshwater, NP  [facility] [phone] (phone) [phone] (fax)  [place]

## 2022-07-16 ENCOUNTER — Other Ambulatory Visit: Payer: Self-pay

## 2022-07-16 DIAGNOSIS — K219 Gastro-esophageal reflux disease without esophagitis: Secondary | ICD-10-CM

## 2022-07-16 DIAGNOSIS — E1169 Type 2 diabetes mellitus with other specified complication: Secondary | ICD-10-CM

## 2022-07-16 MED ORDER — DAPAGLIFLOZIN PROPANEDIOL 10 MG PO TABS: 10.0000 mg | ORAL_TABLET | Freq: Every day | ORAL | 0 refills | Status: DC

## 2022-07-16 MED ORDER — OMEPRAZOLE 20 MG PO CPDR: 20.0000 mg | DELAYED_RELEASE_CAPSULE | Freq: Every day | ORAL | 0 refills | Status: DC

## 2022-07-17 ENCOUNTER — Other Ambulatory Visit: Payer: Self-pay | Admitting: Nurse Practitioner

## 2022-07-17 DIAGNOSIS — E1169 Type 2 diabetes mellitus with other specified complication: Secondary | ICD-10-CM

## 2022-07-17 MED ORDER — METFORMIN HCL 1000 MG PO TABS: ORAL_TABLET | ORAL | 1 refills | Status: DC

## 2022-07-21 ENCOUNTER — Other Ambulatory Visit: Payer: Self-pay

## 2022-07-21 DIAGNOSIS — I152 Hypertension secondary to endocrine disorders: Secondary | ICD-10-CM

## 2022-07-21 MED ORDER — AMLODIPINE BESYLATE 2.5 MG PO TABS: 2.5000 mg | ORAL_TABLET | Freq: Every day | ORAL | 0 refills | Status: DC

## 2022-07-21 MED ORDER — LISINOPRIL 40 MG PO TABS: 40.0000 mg | ORAL_TABLET | Freq: Every day | ORAL | 0 refills | Status: DC

## 2022-08-14 ENCOUNTER — Ambulatory Visit (INDEPENDENT_AMBULATORY_CARE_PROVIDER_SITE_OTHER): Payer: Medicare Other

## 2022-08-14 ENCOUNTER — Ambulatory Visit
Admission: EM | Admit: 2022-08-14 | Discharge: 2022-08-14 | Disposition: A | Discharge status: Home / Self Care | Payer: Medicare Other | Attending: Physician Assistant | Admitting: Physician Assistant

## 2022-08-14 DIAGNOSIS — R918 Other nonspecific abnormal finding of lung field: Secondary | ICD-10-CM

## 2022-08-14 DIAGNOSIS — R059 Cough, unspecified: Secondary | ICD-10-CM

## 2022-08-14 DIAGNOSIS — R0602 Shortness of breath: Secondary | ICD-10-CM | POA: Diagnosis not present

## 2022-08-14 DIAGNOSIS — R0989 Other specified symptoms and signs involving the circulatory and respiratory systems: Secondary | ICD-10-CM

## 2022-08-14 DIAGNOSIS — R509 Fever, unspecified: Secondary | ICD-10-CM

## 2022-08-14 DIAGNOSIS — J209 Acute bronchitis, unspecified: Secondary | ICD-10-CM | POA: Diagnosis not present

## 2022-08-14 DIAGNOSIS — R519 Headache, unspecified: Secondary | ICD-10-CM | POA: Diagnosis not present

## 2022-08-14 LAB — POCT FASTING CBG KUC MANUAL ENTRY: POCT Glucose (KUC): 134 mg/dL — AB (ref 70–99)

## 2022-08-14 MED ORDER — ALBUTEROL SULFATE HFA 108 (90 BASE) MCG/ACT IN AERS
2.0000 | INHALATION_SPRAY | Freq: Once | RESPIRATORY_TRACT | Status: AC
  Administered 2022-08-14: 2 via RESPIRATORY_TRACT

## 2022-08-14 MED ORDER — AZITHROMYCIN 250 MG PO TABS: ORAL_TABLET | ORAL | 0 refills | Status: DC

## 2022-08-14 MED ORDER — HYDROCODONE BIT-HOMATROP MBR 5-1.5 MG/5ML PO SOLN: 5.0000 mL | Freq: Four times a day (QID) | ORAL | 0 refills | Status: DC | PRN

## 2022-08-14 MED ORDER — AEROCHAMBER PLUS FLO-VU MEDIUM MISC
1.0000 | Freq: Once | Status: AC
  Administered 2022-08-14: 1

## 2022-08-14 NOTE — Discharge Instructions (Addendum)
You have upper respiratory infection which is more viral in origin but also associated is an acute case of bronchitis.  It is recommended that she use the albuterol inhaler with spacer, 2 puffs every 6 hours on a regular basis for the next couple days to try to decrease the cough and shortness of breath.  Is also recommended that she use the Hycodan cough syrup, 1 to 2 teaspoons every 6-8 hours as needed for acute cough mainly of the evening and at night because this medication does cause drowsiness. The antibiotic is Zithromax 250 mg, 2 tablets today and then 1 tablet daily until completed. Advised take Tylenol or ibuprofen for fever, body discomfort. Advised to follow-up with PCP or return to urgent care if symptoms fail to improve over the next 5 to 7 days.  Your fingerstick glucose this morning was 134.  The chest x-ray did not show any evidence of pneumonia.  Chest x-ray did pick up a small 3.2 cm opacity or prominent area in the left lung hilum.  This could be a calcification or an inflammatory process however advised for you to follow-up with your PCP as a chest CT scan needs to be ordered in order for this area to be evaluated and properly diagnosed.

## 2022-08-14 NOTE — ED Provider Notes (Signed)
Joseph Collier    CSN: [id number] Arrival date & time: 08/14/22  1245      History   Chief Complaint Chief Complaint  Patient presents with   URI   Fever   Shortness of Breath    HPI [name] is a 71 y.o. male.   71 year old male presents with fever, shortness of breath and cough.  Patient indicates for the past 5 days he has been having progressive bronchial chest congestion, cough with coughing exacerbation and fits which are typically worse in the evening and at night.  Patient indicates that he has having shortness of breath with the cough and his production has developed from clear, to yellow, green, to rust colored over the past couple days.  Patient indicates that he has been taking some Mucinex DM without improvement from his cough.  Patient also indicates that with the coughing episodes he is having some chest soreness during the coughing and also with deep breathing.  Patient denies wheezing associated with the cough.  He also indicates he is having some upper respiratory congestion with some mild intermittent headache, postnasal drip and rhinitis which is mainly been clear.  He also indicates he is having fever which is intermittent, chills, sweats, body aches and pain.  Patient indicates that he has not been around any family or friends that been sick with similar symptoms.  Patient is a diabetic and relates his last glucose that he checked was 140 and this was 5 days ago.  Patient indicates he is not a smoker.  Patient denies nausea or vomiting   URI Presenting symptoms: cough and fever   Fever Associated symptoms: chills and cough   Shortness of Breath Associated symptoms: cough, diaphoresis and fever     Past Medical History:  Diagnosis Date   Allergic rhinitis, cause unspecified    Allergy    Anemia, unspecified    Arthritis    Cancer ([place]) 08/17/2006   Melanoma   Diabetes mellitus without complication ([place])    type II   Essential  hypertension, benign    GERD (gastroesophageal reflux disease)    Impotence of organic origin 04/14/2007   Obesity, unspecified    Other and unspecified hyperlipidemia    Personal history of colonic polyps    Skin lesion 04/14/2007   melanoma    Patient Active Problem List   Diagnosis Date Noted   Respiratory tract infection due to COVID-19 virus 02/09/2021   High risk medications (not anticoagulants) long-term use 09/15/2019   Health education/counseling 10/18/2018   Elevated lipase 02/08/2018   Physically inactive 12/23/2016   Personal history of colonic polyps 03/07/2016   Allergic rhinitis 03/07/2016   Morbid obesity ([place]) 03/07/2016   History of tobacco abuse- less than 30 pack year history quit 1991 03/07/2016   Family history of cardiovascular disease 05/24/2015   History of colonic polyps 05/24/2015   Type 2 diabetes mellitus with other specified complication ([place]) 80/99/8338   Erectile dysfunction 05/10/2014   History of melanoma 02/05/2014   GERD (gastroesophageal reflux disease) 02/05/2014   Degenerative disc disease, cervical 10/30/2013   Hyperlipidemia associated with type 2 diabetes mellitus ([place]) 09/05/2012   Hypertension associated with diabetes ([place]) 09/05/2012   History of surgical procedure 10/07/2011    Past Surgical History:  Procedure Laterality Date   APPENDECTOMY     arthroscopy of knee  08/17/2006   Left Knee   COLONOSCOPY  08/18/2007   normal. [name].   HERNIA REPAIR  age 34   melanoma of forearm resection  08/17/1990   Left        [name]   POLYPECTOMY     2009- TA +   REPLACEMENT TOTAL KNEE Left    TONSILLECTOMY AND ADENOIDECTOMY         Home Medications    Prior to Admission medications   Medication Sig Start Date End Date Taking? Authorizing Provider  azithromycin (ZITHROMAX Z-PAK) 250 MG tablet 2 tablets initially and then 1 tablet daily until medication completed. 08/14/22  Yes Nyoka Lint, PA-C  HYDROcodone bit-homatropine  (HYCODAN) 5-1.5 MG/5ML syrup Take 5 mLs by mouth every 6 (six) hours as needed for cough. 08/14/22  Yes Nyoka Lint, PA-C  amLODipine (NORVASC) 2.5 MG tablet Take 1 tablet (2.5 mg total) by mouth daily. 07/21/22   Ronnell Freshwater, NP  aspirin EC 81 MG tablet Take 81 mg by mouth daily.    [provider]  dapagliflozin propanediol (FARXIGA) 10 MG TABS tablet Take 1 tablet (10 mg total) by mouth daily. 07/16/22   Ronnell Freshwater, NP  Dulaglutide (TRULICITY) 3 ZO/1.0RU SOPN INJECT 3 MG INTO THE SKIN ONCE A WEEK 06/15/22   Boscia, Heather E, NP  fluticasone (FLONASE) 50 MCG/ACT nasal spray Place 2 sprays into both nostrils daily.    [provider]  glipiZIDE (GLUCOTROL) 10 MG tablet TAKE 1 TABLET BY MOUTH TWICE DAILY BEFORE A MEAL 06/22/22   Abonza, Maritza, PA-C  glucose blood test strip Use as instructed 01/29/17   Opalski, Deborah, DO  lisinopril (ZESTRIL) 40 MG tablet Take 1 tablet (40 mg total) by mouth daily. 07/21/22   Ronnell Freshwater, NP  metFORMIN (GLUCOPHAGE) 1000 MG tablet TAKE 1 TABLET BY MOUTH IN THE MORNING AND 1 TABLET BY MOUTH IN THE EVENING WITH MEALS 07/17/22   Ronnell Freshwater, NP  Multiple Vitamin (MULTIVITAMIN) tablet Take 1 tablet by mouth daily.    [provider]  omeprazole (PRILOSEC) 20 MG capsule Take 1 capsule (20 mg total) by mouth daily. 07/16/22   Ronnell Freshwater, NP  sildenafil (REVATIO) 20 MG tablet TAKE ONE TO THREE TABLETS BY MOUTH PRIOR TO INTERCOURSE AS NEEDED 07/28/21   Lorrene Reid, PA-C  simvastatin (ZOCOR) 20 MG tablet TAKE 1 TABLET BY MOUTH AT BEDTIME 04/03/22   Lorrene Reid, PA-C    Family History Family History  Problem Relation Age of Onset   Cancer Mother 40       lung   Coronary artery disease Father    Hypertension Father    Heart disease Father        AMI cause of death   Diabetes Father    Hypertension Brother    Diabetes Brother    Colon cancer Neg Hx    Colon polyps Neg Hx    Esophageal cancer Neg Hx     Rectal cancer Neg Hx    Stomach cancer Neg Hx     Social History Social History   Tobacco Use   Smoking status: Former    Packs/day: 1.00    Years: 25.00    Total pack years: 25.00    Types: Cigarettes    Quit date: 08/17/1989    Years since quitting: 33.0   Smokeless tobacco: Never   Tobacco comments:    quit 1991  Vaping Use   Vaping Use: Never used  Substance Use Topics   Alcohol use: Yes    Alcohol/week: 5.0 standard drinks of alcohol    Types: 5  Cans of beer per week    Comment: moderate: drinks hard liquor 7 drinks per month in 08/2011/4 drinks 4 a week   Drug use: No     Allergies   Penicillins, Saxagliptin, and Tetracyclines & related   Review of Systems Review of Systems  Constitutional:  Positive for chills, diaphoresis and fever.  Respiratory:  Positive for cough and shortness of breath.      Physical Exam Triage Vital Signs ED Triage Vitals [08/14/22 0832]  Enc Vitals Group     BP 104/73     Pulse Rate (!) 111     Resp 20     Temp 98.2 F (36.8 C)     Temp Source Oral     SpO2 94 %     Weight      Height      Head Circumference      Peak Flow      Pain Score 5     Pain Loc      Pain Edu?      Excl. in [place]?    No data found.  Updated Vital Signs BP 104/73 (BP Location: Left Arm)   Pulse (!) 111   Temp 98.2 F (36.8 C) (Oral)   Resp 20   SpO2 94%   Visual Acuity Right Eye Distance:   Left Eye Distance:   Bilateral Distance:    Right Eye Near:   Left Eye Near:    Bilateral Near:     Physical Exam Constitutional:      Appearance: He is well-developed.  HENT:     Right Ear: Tympanic membrane and ear canal normal.     Left Ear: Tympanic membrane and ear canal normal.     Mouth/Throat:     Mouth: Mucous membranes are moist.     Pharynx: Oropharynx is clear.  Cardiovascular:     Rate and Rhythm: Normal rate and regular rhythm.     Heart sounds: Normal heart sounds.  Pulmonary:     Effort: Pulmonary effort is normal.      Breath sounds: Normal breath sounds and air entry. No wheezing, rhonchi or rales.  Lymphadenopathy:     Cervical: No cervical adenopathy.  Neurological:     Mental Status: He is alert.      UC Treatments / Results  Labs (all labs ordered are listed, but only abnormal results are displayed) Labs Reviewed  POCT FASTING CBG [facility] MANUAL ENTRY - Abnormal; Notable for the following components:      Result Value   POCT Glucose ([facility]) 134 (*)    All other components within normal limits    EKG   Radiology DG Chest 2 View  Result Date: 08/14/2022 CLINICAL DATA:  Cough, congestion, headache. EXAM: CHEST - 2 VIEW COMPARISON:  None Available. FINDINGS: The cardiomediastinal silhouette is normal. There is focal opacity in the left hilar region measuring 3.2 cm. There is no focal consolidation or pulmonary edema. There is no pleural effusion or pneumothorax There is no acute osseous abnormality. There is deformity of the right clavicle consistent with prior fracture. IMPRESSION: Opacity in the left hilum may reflect prominent vasculature; however, recommend CT chest with contrast for further evaluation to exclude neoplasm. Electronically Signed   By: Valetta Mole M.D.   On: 08/14/2022 09:07    Procedures Procedures (including critical Collier time)  Medications Ordered in UC Medications  albuterol (VENTOLIN HFA) 108 (90 Base) MCG/ACT inhaler 2 puff (2 puffs Inhalation Given 08/14/22 0855)  AeroChamber Plus Flo-Vu Medium MISC 1 each (1 each Other Given 08/14/22 0855)    Initial Impression / Assessment and Plan / UC Course  I have reviewed the triage vital signs and the nursing notes.  Pertinent labs & imaging results that were available during my Collier of the patient were reviewed by me and considered in my medical decision making (see chart for details).    Plan: 1.  The fever will be treated with the following: A.  Advised patient to take ibuprofen or Tylenol as needed for fever and body  aches. 2.  Shortness of breath we treated with the following: A.  Albuterol inhaler with spacer, 2 puffs every 6 hours on a regular basis to help decrease cough and shortness of breath. 3.  The acute bronchitis will be treated with the following: A.  Hycodan cough syrup, 1 to 2 teaspoons every 6-8 hours as needed for cough. B.  Zithromax 250 mg, 2 tablets initially and then 1 tablet daily until medication is completed. 4.  Patient advised to follow-up with PCP for the left hilar opacity and CT scan of the chest has been recommended to clarify this area for appropriate diagnosis. Final Clinical Impressions(s) / UC Diagnoses   Final diagnoses:  Fever, unspecified  Shortness of breath  Acute bronchitis, unspecified organism  Opacity of lung on imaging study     Discharge Instructions      You have upper respiratory infection which is more viral in origin but also associated is an acute case of bronchitis.  It is recommended that she use the albuterol inhaler with spacer, 2 puffs every 6 hours on a regular basis for the next couple days to try to decrease the cough and shortness of breath.  Is also recommended that she use the Hycodan cough syrup, 1 to 2 teaspoons every 6-8 hours as needed for acute cough mainly of the evening and at night because this medication does cause drowsiness. The antibiotic is Zithromax 250 mg, 2 tablets today and then 1 tablet daily until completed. Advised take Tylenol or ibuprofen for fever, body discomfort. Advised to follow-up with PCP or return to urgent Collier if symptoms fail to improve over the next 5 to 7 days.  Your fingerstick glucose this morning was 134.  The chest x-ray did not show any evidence of pneumonia.  Chest x-ray did pick up a small 3.2 cm opacity or prominent area in the left lung hilum.  This could be a calcification or an inflammatory process however advised for you to follow-up with your PCP as a chest CT scan needs to be ordered in order  for this area to be evaluated and properly diagnosed.       ED Prescriptions     Medication Sig Dispense Auth. Provider   azithromycin (ZITHROMAX Z-PAK) 250 MG tablet 2 tablets initially and then 1 tablet daily until medication completed. 6 each Nyoka Lint, PA-C   HYDROcodone bit-homatropine (HYCODAN) 5-1.5 MG/5ML syrup Take 5 mLs by mouth every 6 (six) hours as needed for cough. 120 mL Nyoka Lint, PA-C      I have reviewed the PDMP during this encounter.   Nyoka Lint, PA-C 08/14/22 [phone]

## 2022-08-14 NOTE — ED Triage Notes (Signed)
Pt presents to uc with co of cough congestion ha since christmas day. Pt reports phlegm last night was rust color. Has been taking musinex

## 2022-08-18 ENCOUNTER — Telehealth: Payer: Self-pay | Admitting: *Deleted

## 2022-08-18 NOTE — Telephone Encounter (Signed)
Pt came in office and said he had imaging done at [facility] and they recommend him have a follow up CT.  Scheduled patient a office visit to go over results and follow up UC visit.  Please review if this is something that needs to be done before your visit with patient on 09/22/22.

## 2022-08-18 NOTE — Telephone Encounter (Signed)
I know that 09/22/2022 seems far off, but I would want him to have finished antibiotics for a week or so and feel better before we get additional imaging. I can place the order for the recommended CT in 2 weeks. They would call to schedule that

## 2022-09-03 DIAGNOSIS — B368 Other specified superficial mycoses: Secondary | ICD-10-CM | POA: Diagnosis not present

## 2022-09-03 DIAGNOSIS — H6241 Otitis externa in other diseases classified elsewhere, right ear: Secondary | ICD-10-CM | POA: Diagnosis not present

## 2022-09-07 ENCOUNTER — Other Ambulatory Visit: Payer: Self-pay | Admitting: Nurse Practitioner

## 2022-09-07 DIAGNOSIS — Z0000 Encounter for general adult medical examination without abnormal findings: Secondary | ICD-10-CM

## 2022-09-07 DIAGNOSIS — E1169 Type 2 diabetes mellitus with other specified complication: Secondary | ICD-10-CM

## 2022-09-07 DIAGNOSIS — I152 Hypertension secondary to endocrine disorders: Secondary | ICD-10-CM

## 2022-09-17 ENCOUNTER — Other Ambulatory Visit: Payer: Self-pay

## 2022-09-17 DIAGNOSIS — E1169 Type 2 diabetes mellitus with other specified complication: Secondary | ICD-10-CM

## 2022-09-17 DIAGNOSIS — H9193 Unspecified hearing loss, bilateral: Secondary | ICD-10-CM | POA: Insufficient documentation

## 2022-09-17 DIAGNOSIS — B368 Other specified superficial mycoses: Secondary | ICD-10-CM | POA: Diagnosis not present

## 2022-09-17 DIAGNOSIS — H6240 Otitis externa in other diseases classified elsewhere, unspecified ear: Secondary | ICD-10-CM | POA: Diagnosis not present

## 2022-09-17 DIAGNOSIS — H903 Sensorineural hearing loss, bilateral: Secondary | ICD-10-CM | POA: Diagnosis not present

## 2022-09-17 MED ORDER — SIMVASTATIN 20 MG PO TABS: 20.0000 mg | ORAL_TABLET | Freq: Every day | ORAL | 0 refills | Status: DC

## 2022-09-17 MED ORDER — GLIPIZIDE 10 MG PO TABS: 10.0000 mg | ORAL_TABLET | Freq: Two times a day (BID) | ORAL | 0 refills | Status: DC

## 2022-09-22 ENCOUNTER — Ambulatory Visit (INDEPENDENT_AMBULATORY_CARE_PROVIDER_SITE_OTHER): Payer: Medicare Other | Admitting: Nurse Practitioner

## 2022-09-22 ENCOUNTER — Encounter: Payer: Self-pay | Admitting: Nurse Practitioner

## 2022-09-22 VITALS — BP 132/77 | HR 89 | Ht 68.9 in | Wt 236.4 lb

## 2022-09-22 DIAGNOSIS — E1159 Type 2 diabetes mellitus with other circulatory complications: Secondary | ICD-10-CM

## 2022-09-22 DIAGNOSIS — R911 Solitary pulmonary nodule: Secondary | ICD-10-CM | POA: Diagnosis not present

## 2022-09-22 DIAGNOSIS — E1169 Type 2 diabetes mellitus with other specified complication: Secondary | ICD-10-CM

## 2022-09-22 DIAGNOSIS — E785 Hyperlipidemia, unspecified: Secondary | ICD-10-CM | POA: Diagnosis not present

## 2022-09-22 DIAGNOSIS — I152 Hypertension secondary to endocrine disorders: Secondary | ICD-10-CM | POA: Diagnosis not present

## 2022-09-22 LAB — POCT UA - MICROALBUMIN
Albumin/Creatinine Ratio, Urine, POC: 30
Creatinine, POC: 50 mg/dL
Microalbumin Ur, POC: 10 mg/L

## 2022-09-22 NOTE — Progress Notes (Signed)
Established patient visit   Patient: Joseph Collier   DOB: 1951-05-13   72 y.o. Male  MRN: [id number] Visit Date: 09/22/2022   Chief Complaint  Patient presents with   Follow-up   Subjective    HPI  Follow up  -type 2 diabetes  --most recent HgbA1c 6.6 with normal microalbumin --hypertension  --well managed on current medication.  --most recent lipid panel essentially normal.  -He denies chest pain, chest pressure, or shortness of breath. He denies headaches or visual disturbances. He denies abdominal pain, nausea, vomiting, or changes in bowel or bladder habits.     Medications: Outpatient Medications Prior to Visit  Medication Sig   aspirin EC 81 MG tablet Take 81 mg by mouth daily.   Multiple Vitamin (MULTIVITAMIN) tablet Take 1 tablet by mouth daily.   sildenafil (REVATIO) 20 MG tablet TAKE ONE TO THREE TABLETS BY MOUTH PRIOR TO INTERCOURSE AS NEEDED   [DISCONTINUED] amLODipine (NORVASC) 2.5 MG tablet Take 1 tablet (2.5 mg total) by mouth daily.   [DISCONTINUED] dapagliflozin propanediol (FARXIGA) 10 MG TABS tablet Take 1 tablet (10 mg total) by mouth daily.   [DISCONTINUED] Dulaglutide (TRULICITY) 3 0000000 SOPN INJECT 3 MG INTO THE SKIN ONCE A WEEK   [DISCONTINUED] fluticasone (FLONASE) 50 MCG/ACT nasal spray Place 2 sprays into both nostrils daily.   [DISCONTINUED] glipiZIDE (GLUCOTROL) 10 MG tablet Take 1 tablet (10 mg total) by mouth 2 (two) times daily before a meal.   [DISCONTINUED] glucose blood test strip Use as instructed   [DISCONTINUED] lisinopril (ZESTRIL) 40 MG tablet Take 1 tablet (40 mg total) by mouth daily.   [DISCONTINUED] metFORMIN (GLUCOPHAGE) 1000 MG tablet TAKE 1 TABLET BY MOUTH IN THE MORNING AND 1 TABLET BY MOUTH IN THE EVENING WITH MEALS   [DISCONTINUED] omeprazole (PRILOSEC) 20 MG capsule Take 1 capsule (20 mg total) by mouth daily.   [DISCONTINUED] simvastatin (ZOCOR) 20 MG tablet Take 1 tablet (20 mg total) by mouth at bedtime.   [DISCONTINUED]  azithromycin (ZITHROMAX Z-PAK) 250 MG tablet 2 tablets initially and then 1 tablet daily until medication completed.   [DISCONTINUED] HYDROcodone bit-homatropine (HYCODAN) 5-1.5 MG/5ML syrup Take 5 mLs by mouth every 6 (six) hours as needed for cough.   No facility-administered medications prior to visit.    Review of Systems See HPI    Last CBC Lab Results  Component Value Date   WBC 8.6 03/10/2022   HGB 15.8 03/10/2022   HCT 47.1 03/10/2022   MCV 90 03/10/2022   MCH 30.3 03/10/2022   RDW 12.6 03/10/2022   PLT 323 [id number]   Last metabolic panel Lab Results  Component Value Date   GLUCOSE 128 (H) 10/07/2022   NA 139 10/07/2022   K 4.6 10/07/2022   CL 100 10/07/2022   CO2 22 10/07/2022   BUN 13 10/07/2022   CREATININE 1.00 10/07/2022   EGFR 81 10/07/2022   CALCIUM 9.7 10/07/2022   PROT 6.5 10/07/2022   ALBUMIN 4.6 10/07/2022   LABGLOB 1.9 10/07/2022   AGRATIO 2.4 (H) 10/07/2022   BILITOT 0.6 10/07/2022   ALKPHOS 52 10/07/2022   AST 20 10/07/2022   ALT 24 10/07/2022   Last lipids Lab Results  Component Value Date   CHOL 106 10/07/2022   HDL 39 (L) 10/07/2022   LDLCALC 40 10/07/2022   TRIG 162 (H) 10/07/2022   CHOLHDL 2.7 10/07/2022   Last hemoglobin A1c Lab Results  Component Value Date   HGBA1C 6.6 (H) 10/07/2022   Last thyroid functions  Lab Results  Component Value Date   TSH 2.440 10/07/2022   Last vitamin D Lab Results  Component Value Date   VD25OH 49.5 06/16/2019       Objective     Today's Vitals   09/22/22 1536  BP: 132/77  Pulse: 89  SpO2: 96%  Weight: 236 lb 6.4 oz (107.2 kg)  Height: 5' 8.9" (1.75 m)   Body mass index is 35.01 kg/m.  BP Readings from Last 3 Encounters:  10/13/22 (Abnormal) 150/88  09/22/22 132/77  08/14/22 104/73    Wt Readings from Last 3 Encounters:  10/13/22 233 lb (105.7 kg)  09/22/22 236 lb 6.4 oz (107.2 kg)  07/14/22 237 lb (107.5 kg)    Physical Exam Vitals and nursing note reviewed.   Constitutional:      Appearance: Normal appearance. He is well-developed.  HENT:     Head: Normocephalic and atraumatic.     Nose: Nose normal.     Mouth/Throat:     Mouth: Mucous membranes are moist.     Pharynx: Oropharynx is clear.  Eyes:     Extraocular Movements: Extraocular movements intact.     Conjunctiva/sclera: Conjunctivae normal.     Pupils: Pupils are equal, round, and reactive to light.  Cardiovascular:     Rate and Rhythm: Normal rate and regular rhythm.     Pulses: Normal pulses.     Heart sounds: Normal heart sounds.  Pulmonary:     Effort: Pulmonary effort is normal.     Breath sounds: Normal breath sounds.  Abdominal:     Palpations: Abdomen is soft.  Musculoskeletal:        General: Normal range of motion.     Cervical back: Normal range of motion and neck supple.  Lymphadenopathy:     Cervical: No cervical adenopathy.  Skin:    General: Skin is warm and dry.     Capillary Refill: Capillary refill takes less than 2 seconds.  Neurological:     General: No focal deficit present.     Mental Status: He is alert and oriented to person, place, and time.  Psychiatric:        Mood and Affect: Mood normal.        Behavior: Behavior normal.        Thought Content: Thought content normal.        Judgment: Judgment normal.     Results for orders placed or performed in visit on 09/22/22  POCT UA - Microalbumin  Result Value Ref Range   Microalbumin Ur, POC 10 mg/L   Creatinine, POC 50 mg/dL   Albumin/Creatinine Ratio, Urine, POC 30     Assessment & Plan    1. Type 2 diabetes mellitus with other specified complication, unspecified whether long term insulin use (HCC) Stable. HgbA1c 6.6 with normal urine microalbumin. Continue diabetic medication as prescribed.  - POCT UA - Microalbumin  2. Hypertension associated with diabetes ([place]) Stable. Continue bp medication as prescribed  - POCT UA - Microalbumin  3. Hyperlipidemia associated with type 2 diabetes  mellitus (HCC) Stable lipid panel. Continue statin as prescribed  - POCT UA - Microalbumin  4. Solitary pulmonary nodule Will get CT chest with contrast for further evaluation.  - CT Chest W Contrast; Future   Problem List Items Addressed This Visit       Cardiovascular and Mediastinum   Hypertension associated with diabetes ([place]) (Chronic)   Relevant Orders   POCT UA - Microalbumin (Completed)  Endocrine   Hyperlipidemia associated with type 2 diabetes mellitus (HCC) (Chronic)   Relevant Orders   POCT UA - Microalbumin (Completed)   Type 2 diabetes mellitus with other specified complication ([place]) - Primary   Relevant Orders   POCT UA - Microalbumin (Completed)     Other   Solitary pulmonary nodule   Relevant Orders   CT Chest W Contrast (Completed)     Return for as scheduled.         Ronnell Freshwater, NP  [facility] [phone] (phone) [phone] (fax)  [place]

## 2022-10-01 ENCOUNTER — Ambulatory Visit
Admission: RE | Admit: 2022-10-01 | Discharge: 2022-10-01 | Disposition: A | Discharge status: Home / Self Care | Payer: Medicare Other | Source: Ambulatory Visit | Attending: Nurse Practitioner | Admitting: Nurse Practitioner

## 2022-10-01 DIAGNOSIS — I700 Atherosclerosis of aorta: Secondary | ICD-10-CM | POA: Diagnosis not present

## 2022-10-01 DIAGNOSIS — K760 Fatty (change of) liver, not elsewhere classified: Secondary | ICD-10-CM | POA: Diagnosis not present

## 2022-10-01 DIAGNOSIS — R918 Other nonspecific abnormal finding of lung field: Secondary | ICD-10-CM | POA: Diagnosis not present

## 2022-10-01 DIAGNOSIS — I2510 Atherosclerotic heart disease of native coronary artery without angina pectoris: Secondary | ICD-10-CM | POA: Diagnosis not present

## 2022-10-01 DIAGNOSIS — R911 Solitary pulmonary nodule: Secondary | ICD-10-CM

## 2022-10-01 MED ORDER — IOPAMIDOL (ISOVUE-300) INJECTION 61%
75.0000 mL | Freq: Once | INTRAVENOUS | Status: AC | PRN
  Administered 2022-10-01: 75 mL via INTRAVENOUS

## 2022-10-07 ENCOUNTER — Other Ambulatory Visit: Payer: Medicare Other

## 2022-10-07 DIAGNOSIS — E1159 Type 2 diabetes mellitus with other circulatory complications: Secondary | ICD-10-CM

## 2022-10-07 DIAGNOSIS — E785 Hyperlipidemia, unspecified: Secondary | ICD-10-CM | POA: Diagnosis not present

## 2022-10-07 DIAGNOSIS — Z0000 Encounter for general adult medical examination without abnormal findings: Secondary | ICD-10-CM | POA: Diagnosis not present

## 2022-10-07 DIAGNOSIS — E1169 Type 2 diabetes mellitus with other specified complication: Secondary | ICD-10-CM

## 2022-10-07 DIAGNOSIS — I152 Hypertension secondary to endocrine disorders: Secondary | ICD-10-CM | POA: Diagnosis not present

## 2022-10-08 LAB — COMPREHENSIVE METABOLIC PANEL
ALT: 24 IU/L (ref 0–44)
AST: 20 IU/L (ref 0–40)
Albumin/Globulin Ratio: 2.4 — ABNORMAL HIGH (ref 1.2–2.2)
Albumin: 4.6 g/dL (ref 3.9–4.9)
Alkaline Phosphatase: 52 IU/L (ref 44–121)
BUN/Creatinine Ratio: 13 (ref 10–24)
BUN: 13 mg/dL (ref 8–27)
Bilirubin Total: 0.6 mg/dL (ref 0.0–1.2)
CO2: 22 mmol/L (ref 20–29)
Calcium: 9.7 mg/dL (ref 8.6–10.2)
Chloride: 100 mmol/L (ref 96–106)
Creatinine, Ser: 1 mg/dL (ref 0.76–1.27)
Globulin, Total: 1.9 g/dL (ref 1.5–4.5)
Glucose: 128 mg/dL — ABNORMAL HIGH (ref 70–99)
Potassium: 4.6 mmol/L (ref 3.5–5.2)
Sodium: 139 mmol/L (ref 134–144)
Total Protein: 6.5 g/dL (ref 6.0–8.5)
eGFR: 81 mL/min/{1.73_m2} (ref >59)

## 2022-10-08 LAB — LIPID PANEL
Chol/HDL Ratio: 2.7 ratio (ref 0.0–5.0)
Cholesterol, Total: 106 mg/dL (ref 100–199)
HDL: 39 mg/dL — ABNORMAL LOW (ref >39)
LDL Chol Calc (NIH): 40 mg/dL (ref 0–99)
Triglycerides: 162 mg/dL — ABNORMAL HIGH (ref 0–149)
VLDL Cholesterol Cal: 27 mg/dL (ref 5–40)

## 2022-10-08 LAB — HEMOGLOBIN A1C
Est. average glucose Bld gHb Est-mCnc: 143 mg/dL
Hgb A1c MFr Bld: 6.6 % — ABNORMAL HIGH (ref 4.8–5.6)

## 2022-10-08 LAB — TSH: TSH: 2.44 u[IU]/mL (ref 0.450–4.500)

## 2022-10-10 ENCOUNTER — Other Ambulatory Visit: Payer: Self-pay | Admitting: Nurse Practitioner

## 2022-10-10 DIAGNOSIS — K219 Gastro-esophageal reflux disease without esophagitis: Secondary | ICD-10-CM

## 2022-10-10 DIAGNOSIS — E1169 Type 2 diabetes mellitus with other specified complication: Secondary | ICD-10-CM

## 2022-10-13 ENCOUNTER — Encounter: Payer: Self-pay | Admitting: Family Medicine

## 2022-10-13 ENCOUNTER — Ambulatory Visit (INDEPENDENT_AMBULATORY_CARE_PROVIDER_SITE_OTHER): Payer: Medicare Other | Admitting: Family Medicine

## 2022-10-13 VITALS — BP 150/88 | HR 83 | Resp 18 | Ht 68.9 in | Wt 233.0 lb

## 2022-10-13 DIAGNOSIS — Z794 Long term (current) use of insulin: Secondary | ICD-10-CM

## 2022-10-13 DIAGNOSIS — I152 Hypertension secondary to endocrine disorders: Secondary | ICD-10-CM

## 2022-10-13 DIAGNOSIS — E1159 Type 2 diabetes mellitus with other circulatory complications: Secondary | ICD-10-CM | POA: Diagnosis not present

## 2022-10-13 DIAGNOSIS — E785 Hyperlipidemia, unspecified: Secondary | ICD-10-CM | POA: Diagnosis not present

## 2022-10-13 DIAGNOSIS — K219 Gastro-esophageal reflux disease without esophagitis: Secondary | ICD-10-CM

## 2022-10-13 DIAGNOSIS — E1169 Type 2 diabetes mellitus with other specified complication: Secondary | ICD-10-CM | POA: Diagnosis not present

## 2022-10-13 MED ORDER — GLUCOSE BLOOD VI STRP: 1.0000 | ORAL_STRIP | Freq: Three times a day (TID) | 12 refills | Status: AC

## 2022-10-13 MED ORDER — FLUTICASONE PROPIONATE 50 MCG/ACT NA SUSP: 2.0000 | Freq: Every day | NASAL | 1 refills | Status: AC

## 2022-10-13 MED ORDER — LISINOPRIL 40 MG PO TABS: 40.0000 mg | ORAL_TABLET | Freq: Every day | ORAL | 1 refills | Status: DC

## 2022-10-13 MED ORDER — DAPAGLIFLOZIN PROPANEDIOL 10 MG PO TABS: 10.0000 mg | ORAL_TABLET | Freq: Every day | ORAL | 1 refills | Status: DC

## 2022-10-13 MED ORDER — AMLODIPINE BESYLATE 2.5 MG PO TABS: 2.5000 mg | ORAL_TABLET | Freq: Every day | ORAL | 1 refills | Status: DC

## 2022-10-13 MED ORDER — TRULICITY 3 MG/0.5ML ~~LOC~~ SOAJ: SUBCUTANEOUS | 1 refills | Status: DC

## 2022-10-13 MED ORDER — METFORMIN HCL 1000 MG PO TABS: ORAL_TABLET | ORAL | 1 refills | Status: DC

## 2022-10-13 MED ORDER — SIMVASTATIN 20 MG PO TABS: 20.0000 mg | ORAL_TABLET | Freq: Every day | ORAL | 1 refills | Status: DC

## 2022-10-13 MED ORDER — OMEPRAZOLE 20 MG PO CPDR: 20.0000 mg | DELAYED_RELEASE_CAPSULE | Freq: Every day | ORAL | 0 refills | Status: DC

## 2022-10-13 MED ORDER — GLUCOSE BLOOD VI STRP: ORAL_STRIP | 12 refills | Status: DC

## 2022-10-13 MED ORDER — GLIPIZIDE 10 MG PO TABS: 10.0000 mg | ORAL_TABLET | Freq: Two times a day (BID) | ORAL | 1 refills | Status: DC

## 2022-10-13 NOTE — Progress Notes (Signed)
Established Patient Office Visit  Subjective   Patient ID: Joseph Collier, male    DOB: 04-Aug-1951  Age: 72 y.o. MRN: [id number]  Chief Complaint  Patient presents with   Medical Management of Chronic Issues   Diabetes    HPI Joseph Collier is a 72 y.o. male presenting today for follow up of diabetes, hypertension, hyperlipidemia. Diabetes: denies hypoglycemic events, wounds or sores that are not healing well, increased thirst or urination. Denies vision problems, eye exam overdue and patient is aware. Checking glucose at home, ranges have been around 140 fasting, during the day he is sometimes gotten between 70 and 95. Taking dulaglutide, Farxiga, glipizide, metformin as prescribed without any side effects. Has been following low-carb diet as best he can. Hypertension: Patient here for follow-up of elevated blood pressure. He  is not adherent to low salt diet.   Pt denies chest pain, SOB, dizziness, edema, syncope, fatigue or heart palpitations. Taking amlodipine, lisinopril, reports excellent compliance with treatment. Denies side effects. Hyperlipidemia: tolerating simvastatin well with no myalgias or significant side effects. Currently consuming a general diet.  The ASCVD Risk score (Arnett DK, et al., 2019) failed to calculate for the following reasons:   The valid total cholesterol range is 130 to 320 mg/dL  ROS Negative unless otherwise noted in HPI   Objective:     BP (!) 150/88 (BP Location: Right Arm, Patient Position: Sitting, Cuff Size: Large)   Pulse 83   Resp 18   Ht 5' 8.9" (1.75 m)   Wt 233 lb (105.7 kg)   SpO2 99%   BMI 34.51 kg/m   Physical Exam Constitutional:      General: He is not in acute distress.    Appearance: Normal appearance.  HENT:     Head: Normocephalic and atraumatic.  Cardiovascular:     Rate and Rhythm: Normal rate and regular rhythm.     Pulses: Normal pulses.     Heart sounds: Normal heart sounds.  Pulmonary:     Effort: Pulmonary  effort is normal.     Breath sounds: Normal breath sounds.  Skin:    General: Skin is warm and dry.  Neurological:     Mental Status: He is alert and oriented to person, place, and time.  Psychiatric:        Mood and Affect: Mood normal.     Assessment & Plan:  Hypertension associated with diabetes ([place]) Assessment & Plan: Blood pressure elevated upon initial presentation at 150/88, remained elevated at end of visit at 162/95.  Discussed the importance of medication compliance and low-salt diet.  Will bring him back for a blood pressure check in 2 weeks and adjust medication as needed if blood pressure remains elevated then.  Orders: -     amLODIPine Besylate; Take 1 tablet (2.5 mg total) by mouth daily.  Dispense: 90 tablet; Refill: 1 -     Lisinopril; Take 1 tablet (40 mg total) by mouth daily.  Dispense: 90 tablet; Refill: 1  Hyperlipidemia associated with type 2 diabetes mellitus ([name]) Assessment & Plan: LDL 40 which meets goal of less than 70.  Triglycerides 162 and HDL 39, we discussed dietary and lifestyle changes to decrease triglycerides and increase HDL.  Will check lipid panel again in about 6 months and make any medication adjustments necessary.  Continue simvastatin.  Will continue to monitor.  Orders: -     Simvastatin; Take 1 tablet (20 mg total) by mouth at bedtime.  Dispense: 90  tablet; Refill: 1  Type 2 diabetes mellitus with other specified complication, without long-term current use of insulin (HCC) Assessment & Plan: Stable, A1c 6.6 which is consistent with the last time that it was checked as well.  Continue dulaglutide, Farxiga, glipizide, metformin.  We discussed his need for a diabetic eye exam and provided instructions to schedule I.  Encouraged him to limit carbs and exercise.  Will continue to monitor.  Provided contact information for ophthalmologist to have diabetic eye exam.   Type 2 diabetes mellitus with other specified complication, with long-term  current use of insulin ([name]) Assessment & Plan: Stable, A1c 6.6 which is consistent with the last time that it was checked as well.  Continue dulaglutide, Farxiga, glipizide, metformin.  We discussed his need for a diabetic eye exam and provided instructions to schedule I.  Encouraged him to limit carbs and exercise.  Will continue to monitor.  Provided contact information for ophthalmologist to have diabetic eye exam.  Orders: -     Trulicity; INJECT 3 MG INTO THE SKIN ONCE A WEEK  Dispense: 12 mL; Refill: 1 -     Glucose Blood; Use as instructed  Dispense: 100 each; Refill: 12  Morbid obesity ([name]) Assessment & Plan: Associated with T2DM, HTN, HLD.  Encourage lifestyle changes and dietary changes to help with weight in conjunction with comorbid conditions.   Type 2 diabetes mellitus with other specified complication, unspecified whether long term insulin use (HCC) Assessment & Plan: Stable, A1c 6.6 which is consistent with the last time that it was checked as well.  Continue dulaglutide, Farxiga, glipizide, metformin.  We discussed his need for a diabetic eye exam and provided instructions to schedule I.  Encouraged him to limit carbs and exercise.  Will continue to monitor.  Provided contact information for ophthalmologist to have diabetic eye exam.  Orders: -     Dapagliflozin Propanediol; Take 1 tablet (10 mg total) by mouth daily.  Dispense: 90 tablet; Refill: 1 -     glipiZIDE; Take 1 tablet (10 mg total) by mouth 2 (two) times daily before a meal.  Dispense: 180 tablet; Refill: 1 -     metFORMIN HCl; TAKE 1 TABLET BY MOUTH IN THE MORNING AND 1 TABLET BY MOUTH IN THE EVENING WITH MEALS  Dispense: 180 tablet; Refill: 1  Gastroesophageal reflux disease, unspecified whether esophagitis present -     Omeprazole; Take 1 capsule (20 mg total) by mouth daily.  Dispense: 90 capsule; Refill: 0  Other orders -     Fluticasone Propionate; Place 2 sprays into both nostrils daily.  Dispense:  16 g; Refill: 1  Return in 2 weeks for blood pressure check.  Return in 27 days (on 11/09/2022) for Medicare annual wellness visit (already scheduled).    Joseph Harman, PA

## 2022-10-13 NOTE — Addendum Note (Signed)
Addended by: Gemma Payor on: 10/13/2022 02:16 PM   Modules accepted: Orders

## 2022-10-13 NOTE — Assessment & Plan Note (Signed)
Associated with T2DM, HTN, HLD.  Encourage lifestyle changes and dietary changes to help with weight in conjunction with comorbid conditions.

## 2022-10-13 NOTE — Assessment & Plan Note (Signed)
LDL 40 which meets goal of less than 70.  Triglycerides 162 and HDL 39, we discussed dietary and lifestyle changes to decrease triglycerides and increase HDL.  Will check lipid panel again in about 6 months and make any medication adjustments necessary.  Continue simvastatin.  Will continue to monitor.

## 2022-10-13 NOTE — Assessment & Plan Note (Addendum)
Blood pressure elevated upon initial presentation at 150/88, remained elevated at end of visit at 162/95.  Discussed the importance of medication compliance and low-salt diet.  Will bring him back for a blood pressure check in 2 weeks and adjust medication as needed if blood pressure remains elevated then.

## 2022-10-13 NOTE — Assessment & Plan Note (Addendum)
Stable, A1c 6.6 which is consistent with the last time that it was checked as well.  Continue dulaglutide, Farxiga, glipizide, metformin.  We discussed his need for a diabetic eye exam and provided instructions to schedule I.  Encouraged him to limit carbs and exercise.  Will continue to monitor.  Provided contact information for ophthalmologist to have diabetic eye exam.

## 2022-10-14 ENCOUNTER — Ambulatory Visit: Payer: Medicare Other | Admitting: Physician Assistant

## 2022-10-18 DIAGNOSIS — R911 Solitary pulmonary nodule: Secondary | ICD-10-CM | POA: Insufficient documentation

## 2022-10-19 ENCOUNTER — Encounter: Payer: Self-pay | Admitting: Family Medicine

## 2022-10-19 DIAGNOSIS — E119 Type 2 diabetes mellitus without complications: Secondary | ICD-10-CM | POA: Diagnosis not present

## 2022-10-19 DIAGNOSIS — H52203 Unspecified astigmatism, bilateral: Secondary | ICD-10-CM | POA: Diagnosis not present

## 2022-10-19 DIAGNOSIS — H524 Presbyopia: Secondary | ICD-10-CM | POA: Diagnosis not present

## 2022-10-19 DIAGNOSIS — H25813 Combined forms of age-related cataract, bilateral: Secondary | ICD-10-CM | POA: Diagnosis not present

## 2022-10-19 LAB — HM DIABETES EYE EXAM

## 2022-10-27 ENCOUNTER — Other Ambulatory Visit: Payer: Medicare Other

## 2022-11-09 ENCOUNTER — Encounter: Payer: Self-pay | Admitting: Family Medicine

## 2022-11-09 ENCOUNTER — Ambulatory Visit (INDEPENDENT_AMBULATORY_CARE_PROVIDER_SITE_OTHER): Payer: Medicare Other | Admitting: Family Medicine

## 2022-11-09 VITALS — BP 157/92 | HR 95 | Resp 18 | Ht 68.9 in | Wt 240.0 lb

## 2022-11-09 DIAGNOSIS — Z0000 Encounter for general adult medical examination without abnormal findings: Secondary | ICD-10-CM | POA: Diagnosis not present

## 2022-11-09 DIAGNOSIS — Z23 Encounter for immunization: Secondary | ICD-10-CM | POA: Diagnosis not present

## 2022-11-09 DIAGNOSIS — E1159 Type 2 diabetes mellitus with other circulatory complications: Secondary | ICD-10-CM

## 2022-11-09 DIAGNOSIS — I152 Hypertension secondary to endocrine disorders: Secondary | ICD-10-CM

## 2022-11-09 MED ORDER — AMLODIPINE BESYLATE 5 MG PO TABS: 5.0000 mg | ORAL_TABLET | Freq: Every day | ORAL | 2 refills | Status: DC

## 2022-11-09 NOTE — Assessment & Plan Note (Signed)
Blood pressure elevated on last office visit, patient states that blood pressure at home has typically been in the mid to upper Q000111Q systolic.  Recommend increasing amlodipine to 5 mg daily in addition to continuing lisinopril 40 mg daily.  Patient verbalized understanding and is agreeable to this plan.  Encouraged patient to continue ambulatory blood pressure monitoring, follow-up in 2 months.

## 2022-11-09 NOTE — Progress Notes (Signed)
Subjective:   Joseph Collier is a 72 y.o. male who presents for Medicare Annual/Subsequent preventive examination.  Review of Systems    Review of Systems  All other systems reviewed and are negative.         Objective:    Today's Vitals   11/09/22 0832 11/09/22 0833  BP: (!) 149/86 (!) 157/92  Pulse: 95   Resp: 18   SpO2: 99%   Weight: 240 lb (108.9 kg)   Height: 5' 8.9" (1.75 m)    Body mass index is 35.54 kg/m.     01/20/2017    8:33 AM 02/27/2016    1:23 PM  Advanced Directives  Does Patient Have a Medical Advance Directive? No No  Would patient like information on creating a medical advance directive?  No - patient declined information    Current Medications (verified) Outpatient Encounter Medications as of 11/09/2022  Medication Sig   amLODipine (NORVASC) 5 MG tablet Take 1 tablet (5 mg total) by mouth daily.   aspirin EC 81 MG tablet Take 81 mg by mouth daily.   dapagliflozin propanediol (FARXIGA) 10 MG TABS tablet Take 1 tablet (10 mg total) by mouth daily.   Dulaglutide (TRULICITY) 3 0000000 SOPN INJECT 3 MG INTO THE SKIN ONCE A WEEK   fluticasone (FLONASE) 50 MCG/ACT nasal spray Place 2 sprays into both nostrils daily.   glipiZIDE (GLUCOTROL) 10 MG tablet Take 1 tablet (10 mg total) by mouth 2 (two) times daily before a meal.   glucose blood test strip 1 each by Other route in the morning, at noon, and at bedtime. Use as instructed   lisinopril (ZESTRIL) 40 MG tablet Take 1 tablet (40 mg total) by mouth daily.   metFORMIN (GLUCOPHAGE) 1000 MG tablet TAKE 1 TABLET BY MOUTH IN THE MORNING AND 1 TABLET BY MOUTH IN THE EVENING WITH MEALS   Multiple Vitamin (MULTIVITAMIN) tablet Take 1 tablet by mouth daily.   omeprazole (PRILOSEC) 20 MG capsule Take 1 capsule (20 mg total) by mouth daily.   sildenafil (REVATIO) 20 MG tablet TAKE ONE TO THREE TABLETS BY MOUTH PRIOR TO INTERCOURSE AS NEEDED   simvastatin (ZOCOR) 20 MG tablet Take 1 tablet (20 mg total) by mouth at  bedtime.   [DISCONTINUED] amLODipine (NORVASC) 2.5 MG tablet Take 1 tablet (2.5 mg total) by mouth daily.   No facility-administered encounter medications on file as of 11/09/2022.    Allergies (verified) Penicillins, Saxagliptin, and Tetracyclines & related   History: Past Medical History:  Diagnosis Date   Allergic rhinitis, cause unspecified    Allergy    Anemia, unspecified    Arthritis    Cancer ([place]) 08/17/2006   Melanoma   Diabetes mellitus without complication ([place])    type II   Essential hypertension, benign    GERD (gastroesophageal reflux disease)    Impotence of organic origin 04/14/2007   Obesity, unspecified    Other and unspecified hyperlipidemia    Personal history of colonic polyps    Skin lesion 04/14/2007   melanoma   Past Surgical History:  Procedure Laterality Date   APPENDECTOMY     arthroscopy of knee  08/17/2006   Left Knee   COLONOSCOPY  08/18/2007   normal. [name].   HERNIA REPAIR     age 51   melanoma of forearm resection  08/17/1990   Left        [name]   POLYPECTOMY     2009- TA +   REPLACEMENT TOTAL KNEE  Left    TONSILLECTOMY AND ADENOIDECTOMY     Family History  Problem Relation Age of Onset   Cancer Mother 31       lung   Coronary artery disease Father    Hypertension Father    Heart disease Father        AMI cause of death   Diabetes Father    Hypertension Brother    Diabetes Brother    Colon cancer Neg Hx    Colon polyps Neg Hx    Esophageal cancer Neg Hx    Rectal cancer Neg Hx    Stomach cancer Neg Hx    Social History   Socioeconomic History   Marital status: Married    Spouse name: Programmer, multimedia   Number of children: 1   Years of education: 12   Highest education level: Not on file  Occupational History   Occupation: outside Press photographer    Comment: engineered woods since 2000    Employer: [employer]  Tobacco Use   Smoking status: Former    Packs/day: 1.00    Years: 25.00    Additional pack years: 0.00    Total  pack years: 25.00    Types: Cigarettes    Quit date: 08/17/1989    Years since quitting: 33.2    Passive exposure: Never   Smokeless tobacco: Never   Tobacco comments:    quit 1991  Vaping Use   Vaping Use: Never used  Substance and Sexual Activity   Alcohol use: Yes    Alcohol/week: 5.0 standard drinks of alcohol    Types: 5 Cans of beer per week    Comment: moderate: drinks hard liquor 7 drinks per month in 08/2011/4 drinks 4 a week   Drug use: No   Sexual activity: Yes    Birth control/protection: Post-menopausal  Other Topics Concern   Not on file  Social History Narrative   Marital status:  Married x 25 years, happily, second marriage.       Children:  1 son (96 yo.)  Son in [place], [place].       Lives with wife.       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      Tobacco: none       Alcohol:    3 drinks per night every other night.       Drugs: none       Exercise: Light, walking, 3 times a week.        Caffeine use: carbonated beverages; consumes a minimal amount.        Seatbelt:  Always uses seat belts, no texting.       Guns:  Guns in the home stored in locked cabinet.        Home Safety:  Smoke Alarms in the home.        Education: [employer].   Social Determinants of Health   Financial Resource Strain: Not on file  Food Insecurity: Not on file  Transportation Needs: Not on file  Physical Activity: Not on file  Stress: Not on file  Social Connections: Not on file    Tobacco Counseling Counseling given: Not Answered Tobacco comments: quit 1991   Clinical Intake:  Pre-visit preparation completed: No  Pain : No/denies pain     BMI - recorded: 35.54 Nutritional Status: BMI > 30  Obese Nutritional Risks: None Diabetes: Yes CBG done?: No Did pt. bring in CBG monitor from home?: No  How often do you need to have someone help you when you read instructions, pamphlets, or other written materials from your  doctor or pharmacy?: 1 - Never  Diabetic? Yes  Interpreter Needed?: No  Information entered by :: Leanord Asal, RMA   Activities of Daily Living    11/05/2022    1:09 PM 09/22/2022    3:43 PM  In your present state of health, do you have any difficulty performing the following activities:  Hearing? 0 1  Vision? 0 0  Difficulty concentrating or making decisions? 0 1  Walking or climbing stairs? 0 0  Dressing or bathing? 0 0  Doing errands, shopping? 0 0  Preparing Food and eating ? N   Using the Toilet? N   In the past six months, have you accidently leaked urine? N   Do you have problems with loss of bowel control? N   Managing your Medications? N   Managing your Finances? N   Housekeeping or managing your Housekeeping? N     Patient Care Team: Velva Harman, PA as PCP - General (Family Medicine) Gastroenterology, [place] any recent Medical Services you may have received from other than [facility] providers in the past year (date may be approximate).     Assessment:   This is a routine wellness examination for [place].  Hearing/Vision screen No results found.  Dietary issues and exercise activities discussed:     Goals Addressed             This Visit's Progress    DIET - INCREASE WATER INTAKE        Depression Screen    09/22/2022    3:42 PM 07/14/2022   11:06 AM 03/10/2022    8:46 AM 11/07/2021    8:47 AM 07/08/2021    9:01 AM 04/03/2021    9:10 AM 01/30/2021   10:22 AM  PHQ 2/9 Scores  PHQ - 2 Score 0 0 0 0 0 0 0  PHQ- 9 Score 3 4 2 5 2 1  0    Fall Risk    11/05/2022    1:09 PM 10/13/2022    9:48 AM 09/22/2022    3:43 PM 07/14/2022   11:06 AM 11/07/2021    8:30 AM  Fall Risk   Falls in the past year? 0 0 0 0 0  Number falls in past yr: 0 0 0 0 0  Injury with Fall? 0 0 0 0 0  Risk for fall due to :     No Fall Risks  Follow up   Falls evaluation completed  Falls evaluation completed    FALL RISK PREVENTION PERTAINING TO THE HOME:  Any  stairs in or around the home? Yes  If so, are there any without handrails? No  Home free of loose throw rugs in walkways, pet beds, electrical cords, etc? Yes  Adequate lighting in your home to reduce risk of falls? Yes   ASSISTIVE DEVICES UTILIZED TO PREVENT FALLS:  Life alert? No  Use of a cane, walker or w/c? No  Grab bars in the bathroom? No  Shower chair or bench in shower? No  Elevated toilet seat or a handicapped toilet? No   TIMED UP AND GO:  Was the test performed? Yes .  Length of time to ambulate 10 feet: 10-15 sec.   Gait steady and fast without use of assistive device  Cognitive Function:        11/07/2021    8:30 AM  10/02/2020    8:09 AM 06/16/2019    8:14 AM  6CIT Screen  What Year? 0 points 0 points 0 points  What month? 0 points 0 points 0 points  What time? 0 points 0 points 0 points  Count back from 20 0 points 0 points 0 points  Months in reverse 0 points 0 points 0 points  Repeat phrase 0 points 0 points 2 points  Total Score 0 points 0 points 2 points    Immunizations Immunization History  Administered Date(s) Administered   Fluad Quad(high Dose 65+) 05/26/2019, 10/02/2020, 07/08/2021, 04/27/2022   Influenza Split 07/18/2007, 04/25/2012   Influenza, High Dose Seasonal PF 08/08/2018   Influenza,inj,Quad PF,6+ Mos 05/01/2013, 05/09/2014, 05/24/2015, 06/18/2017   PFIZER(Purple Top)SARS-COV-2 Vaccination 10/01/2019, 10/24/2019   Pneumococcal Conjugate-13 06/16/2019   Pneumococcal Polysaccharide-23 05/24/2015   Pneumococcal-Unspecified 08/17/2006   Tdap 09/15/2011, 01/09/2019   Zoster Recombinat (Shingrix) 10/07/2018, 02/14/2019    TDAP status: Up to date  Flu Vaccine status: Up to date  Pneumococcal vaccine status: Completed during today's visit.  Covid-19 vaccine status: Information provided on how to obtain vaccines.   Qualifies for Shingles Vaccine? Yes   Zostavax completed No   Shingrix Completed?: Yes  Screening Tests Health  Maintenance  Topic Date Due   Pneumonia Vaccine 8+ Years old (3 of 3 - PPSV23 or PCV20) 06/15/2020   FOOT EXAM  12/31/2021   COVID-19 Vaccine (3 - 2023-24 season) 11/25/2022 (Originally 04/17/2022)   Hepatitis C Screening  04/22/2029 (Originally 01/29/1969)   HEMOGLOBIN A1C  04/07/2023   Diabetic kidney evaluation - Urine ACR  09/23/2023   Diabetic kidney evaluation - eGFR measurement  10/08/2023   OPHTHALMOLOGY EXAM  10/19/2023   Medicare Annual Wellness (AWV)  11/09/2023   COLONOSCOPY (Pts 45-73yrs Insurance coverage will need to be confirmed)  02/19/2025   DTaP/Tdap/Td (3 - Td or Tdap) 01/08/2029   INFLUENZA VACCINE  Completed   Zoster Vaccines- Shingrix  Completed   HPV VACCINES  Aged Out    Health Maintenance  Health Maintenance Due  Topic Date Due   Pneumonia Vaccine 38+ Years old (43 of 3 - PPSV23 or PCV20) 06/15/2020   FOOT EXAM  12/31/2021    Colorectal cancer screening: Type of screening: Colonoscopy. Completed 03/11/2022. Repeat every 3 years  Lung Cancer Screening: (Low Dose CT Chest recommended if Age 3-80 years, 30 pack-year currently smoking OR have quit w/in 15years.) does not qualify.    Additional Screening:  Hepatitis C Screening: does qualify; Completed 06/16/2019  Vision Screening: Recommended annual ophthalmology exams for early detection of glaucoma and other disorders of the eye. Is the patient up to date with their annual eye exam?  Yes  Who is the provider or what is the name of the office in which the patient attends annual eye exams? [facility]    Dental Screening: Recommended annual dental exams for proper oral hygiene  Community Resource Referral / Chronic Care Management: CRR required this visit?  No   CCM required this visit?  No      Plan:    Encounter for Medicare annual wellness exam  Hypertension associated with diabetes ([place]) Assessment & Plan: Blood pressure elevated on last office visit, patient states that  blood pressure at home has typically been in the mid to upper Q000111Q systolic.  Recommend increasing amlodipine to 5 mg daily in addition to continuing lisinopril 40 mg daily.  Patient verbalized understanding and is agreeable to this plan.  Encouraged patient to continue ambulatory  blood pressure monitoring, follow-up in 2 months.  Orders: -     amLODIPine Besylate; Take 1 tablet (5 mg total) by mouth daily.  Dispense: 30 tablet; Refill: 2  Need for pneumococcal 20-valent conjugate vaccination -     Pneumococcal conjugate vaccine 20-valent    I have personally reviewed and noted the following in the patient's chart:   Medical and social history Use of alcohol, tobacco or illicit drugs  Current medications and supplements including opioid prescriptions. Patient is not currently taking opioid prescriptions. Functional ability and status Nutritional status Physical activity Advanced directives List of other physicians Hospitalizations, surgeries, and ER visits in previous 12 months Vitals Screenings to include cognitive, depression, and falls Referrals and appointments  In addition, I have reviewed and discussed with patient certain preventive protocols, quality metrics, and best practice recommendations. A written personalized care plan for preventive services as well as general preventive health recommendations were provided to patient.    Return in 2 months (on 01/09/2023) for follow-up for hypertension, diabetes, hyperlipidemia.  Velva Harman, PA   11/09/2022   Nurse Notes: face to face 20 min

## 2022-11-09 NOTE — Patient Instructions (Signed)
We will increase your amlodipine to 5 mg a day.  Continue checking your blood pressure at home, and I will see you in a couple of months to check-in!

## 2022-12-01 ENCOUNTER — Ambulatory Visit
Admission: EM | Admit: 2022-12-01 | Discharge: 2022-12-01 | Disposition: A | Discharge status: Home / Self Care | Payer: Medicare Other | Attending: Emergency Medicine | Admitting: Emergency Medicine

## 2022-12-01 ENCOUNTER — Other Ambulatory Visit: Payer: Self-pay

## 2022-12-01 ENCOUNTER — Encounter: Payer: Self-pay | Admitting: Emergency Medicine

## 2022-12-01 DIAGNOSIS — U071 COVID-19: Secondary | ICD-10-CM

## 2022-12-01 MED ORDER — PAXLOVID (300/100) 20 X 150 MG & 10 X 100MG PO TBPK: 3.0000 | ORAL_TABLET | Freq: Two times a day (BID) | ORAL | 0 refills | Status: AC

## 2022-12-01 MED ORDER — PROMETHAZINE-DM 6.25-15 MG/5ML PO SYRP: 5.0000 mL | ORAL_SOLUTION | Freq: Every evening | ORAL | 0 refills | Status: DC | PRN

## 2022-12-01 NOTE — ED Provider Notes (Signed)
[facility]    CSN: [id number] Arrival date & time: 12/01/22  7829    HISTORY   Chief Complaint  Patient presents with   Cough   HPI Joseph Collier is a pleasant, 72 y.o. male who presents to urgent care today. Patient complains of positive home COVID-19 test.  Patient states 3 days ago he began to experience a dry, nonproductive cough, body ache, chills and fever.  Patient states he took Tylenol about 2 hours prior to arrival today, patient has a temperature of 101.9 on arrival with a pulse rate of 132.  Patient denies loss of taste or smell, nausea, vomiting, diarrhea, headache.  The history is provided by the patient.   Past Medical History:  Diagnosis Date   Allergic rhinitis, cause unspecified    Allergy    Anemia, unspecified    Arthritis    Cancer 08/17/2006   Melanoma   Diabetes mellitus without complication    type II   Essential hypertension, benign    GERD (gastroesophageal reflux disease)    Impotence of organic origin 04/14/2007   Obesity, unspecified    Other and unspecified hyperlipidemia    Personal history of colonic polyps    Skin lesion 04/14/2007   melanoma   Patient Active Problem List   Diagnosis Date Noted   Solitary pulmonary nodule 10/18/2022   Bilateral hearing loss 09/17/2022   Allergic rhinitis 03/07/2016   Morbid obesity 03/07/2016   History of tobacco abuse- less than 30 pack year history quit 1991 03/07/2016   Type 2 diabetes mellitus with other specified complication 05/24/2015   Erectile dysfunction 05/10/2014   GERD (gastroesophageal reflux disease) 02/05/2014   Degenerative disc disease, cervical 10/30/2013   Hyperlipidemia associated with type 2 diabetes mellitus 09/05/2012   Hypertension associated with diabetes 09/05/2012   Past Surgical History:  Procedure Laterality Date   APPENDECTOMY     arthroscopy of knee  08/17/2006   Left Knee   COLONOSCOPY  08/18/2007   normal. Madilyn Fireman.   HERNIA REPAIR     age 44    melanoma of forearm resection  08/17/1990   Left        [name]   POLYPECTOMY     2009- TA +   REPLACEMENT TOTAL KNEE Left    TONSILLECTOMY AND ADENOIDECTOMY      Home Medications    Prior to Admission medications   Medication Sig Start Date End Date Taking? Authorizing Provider  amLODipine (NORVASC) 5 MG tablet Take 1 tablet (5 mg total) by mouth daily. 11/09/22   Melida Quitter, PA  aspirin EC 81 MG tablet Take 81 mg by mouth daily.    [provider]  dapagliflozin propanediol (FARXIGA) 10 MG TABS tablet Take 1 tablet (10 mg total) by mouth daily. 10/13/22   Melida Quitter, PA  Dulaglutide (TRULICITY) 3 MG/0.5ML SOPN INJECT 3 MG INTO THE SKIN ONCE A WEEK 10/13/22   Saralyn Pilar A, PA  fluticasone (FLONASE) 50 MCG/ACT nasal spray Place 2 sprays into both nostrils daily. 10/13/22   Melida Quitter, PA  glipiZIDE (GLUCOTROL) 10 MG tablet Take 1 tablet (10 mg total) by mouth 2 (two) times daily before a meal. 10/13/22   Saralyn Pilar A, PA  glucose blood test strip 1 each by Other route in the morning, at noon, and at bedtime. Use as instructed 10/13/22   Saralyn Pilar A, PA  lisinopril (ZESTRIL) 40 MG tablet Take 1 tablet (40 mg total) by mouth daily. 10/13/22  Saralyn Pilar A, PA  metFORMIN (GLUCOPHAGE) 1000 MG tablet TAKE 1 TABLET BY MOUTH IN THE MORNING AND 1 TABLET BY MOUTH IN THE EVENING WITH MEALS 10/13/22   Saralyn Pilar A, PA  Multiple Vitamin (MULTIVITAMIN) tablet Take 1 tablet by mouth daily.    [provider]  omeprazole (PRILOSEC) 20 MG capsule Take 1 capsule (20 mg total) by mouth daily. 10/13/22   Melida Quitter, PA  sildenafil (REVATIO) 20 MG tablet TAKE ONE TO THREE TABLETS BY MOUTH PRIOR TO INTERCOURSE AS NEEDED 07/28/21   Mayer Masker, PA-C  simvastatin (ZOCOR) 20 MG tablet Take 1 tablet (20 mg total) by mouth at bedtime. 10/13/22   Melida Quitter, PA    Family History Family History  Problem Relation Age of Onset   Cancer Mother 31        lung   Coronary artery disease Father    Hypertension Father    Heart disease Father        AMI cause of death   Diabetes Father    Hypertension Brother    Diabetes Brother    Colon cancer Neg Hx    Colon polyps Neg Hx    Esophageal cancer Neg Hx    Rectal cancer Neg Hx    Stomach cancer Neg Hx    Social History Social History   Tobacco Use   Smoking status: Former    Packs/day: 1.00    Years: 25.00    Additional pack years: 0.00    Total pack years: 25.00    Types: Cigarettes    Quit date: 08/17/1989    Years since quitting: 33.3    Passive exposure: Never   Smokeless tobacco: Never   Tobacco comments:    quit 1991  Vaping Use   Vaping Use: Never used  Substance Use Topics   Alcohol use: Yes    Alcohol/week: 5.0 standard drinks of alcohol    Types: 5 Cans of beer per week    Comment: moderate: drinks hard liquor 7 drinks per month in 08/2011/4 drinks 4 a week   Drug use: No   Allergies   Penicillins, Saxagliptin, and Tetracyclines & related  Review of Systems Review of Systems Pertinent findings revealed after performing a 14 point review of systems has been noted in the history of present illness.  Physical Exam Vital Signs BP 116/79 (BP Location: Left Arm)   Pulse (!) 132   Temp (!) 101.9 F (38.8 C) (Oral)   Resp 20   SpO2 93%   No data found.  Physical Exam Vitals and nursing note reviewed.  Constitutional:      General: He is not in acute distress.    Appearance: Normal appearance. He is ill-appearing.  HENT:     Head: Normocephalic and atraumatic.     Salivary Glands: Right salivary gland is not diffusely enlarged or tender. Left salivary gland is not diffusely enlarged or tender.     Right Ear: Tympanic membrane, ear canal and external ear normal. No drainage. No middle ear effusion. There is no impacted cerumen. Tympanic membrane is not erythematous or bulging.     Left Ear: Tympanic membrane, ear canal and external ear normal. No  drainage.  No middle ear effusion. There is no impacted cerumen. Tympanic membrane is not erythematous or bulging.     Nose: Nose normal. No nasal deformity, septal deviation, mucosal edema, congestion or rhinorrhea.     Right Turbinates: Not enlarged, swollen or pale.  Left Turbinates: Not enlarged, swollen or pale.     Right Sinus: No maxillary sinus tenderness or frontal sinus tenderness.     Left Sinus: No maxillary sinus tenderness or frontal sinus tenderness.     Mouth/Throat:     Lips: Pink. No lesions.     Mouth: Mucous membranes are moist. No oral lesions.     Pharynx: Oropharynx is clear. Uvula midline. No posterior oropharyngeal erythema or uvula swelling.     Tonsils: No tonsillar exudate. 0 on the right. 0 on the left.  Eyes:     General: Lids are normal.        Right eye: No discharge.        Left eye: No discharge.     Extraocular Movements: Extraocular movements intact.     Conjunctiva/sclera: Conjunctivae normal.     Right eye: Right conjunctiva is not injected.     Left eye: Left conjunctiva is not injected.  Neck:     Trachea: Trachea and phonation normal.  Cardiovascular:     Rate and Rhythm: Normal rate and regular rhythm.     Pulses: Normal pulses.     Heart sounds: Normal heart sounds. No murmur heard.    No friction rub. No gallop.  Pulmonary:     Effort: Pulmonary effort is normal. No accessory muscle usage, prolonged expiration or respiratory distress.     Breath sounds: Normal breath sounds. No stridor, decreased air movement or transmitted upper airway sounds. No decreased breath sounds, wheezing, rhonchi or rales.  Chest:     Chest wall: No tenderness.  Musculoskeletal:        General: Normal range of motion.     Cervical back: Normal range of motion and neck supple. Normal range of motion.  Lymphadenopathy:     Cervical: No cervical adenopathy.  Skin:    General: Skin is warm and dry.     Findings: No erythema or rash.  Neurological:      General: No focal deficit present.     Mental Status: He is alert and oriented to person, place, and time.  Psychiatric:        Mood and Affect: Mood normal.        Behavior: Behavior normal.     Visual Acuity Right Eye Distance:   Left Eye Distance:   Bilateral Distance:    Right Eye Near:   Left Eye Near:    Bilateral Near:     UC Couse / Diagnostics / Procedures:     Radiology No results found.  Procedures Procedures (including critical care time) EKG  Pending results:  Labs Reviewed - No data to display  Medications Ordered in UC: Medications - No data to display  UC Diagnoses / Final Clinical Impressions(s)   I have reviewed the triage vital signs and the nursing notes.  Pertinent labs & imaging results that were available during my care of the patient were reviewed by me and considered in my medical decision making (see chart for details).    Final diagnoses:  COVID-19   Patient will be treated empirically with Paxlovid.  Last GFR was normal.  Promethazine DM provided for nighttime cough.  Conservative care recommended, return precautions advised.     Please see discharge instructions below for further details of plan of care as provided to patient. ED Prescriptions     Medication Sig Dispense Auth. Provider   promethazine-dextromethorphan (PROMETHAZINE-DM) 6.25-15 MG/5ML syrup Take 5 mLs by mouth at bedtime as needed for  cough. 60 mL Theadora Rama Scales, PA-C   nirmatrelvir & ritonavir (PAXLOVID, 300/100,) 20 x 150 MG & 10 x 100MG  TBPK Take 3 tablets by mouth 2 (two) times daily for 5 days. Last GFR 81 on 10/07/2022 30 tablet Theadora Rama Scales, PA-C      PDMP not reviewed this encounter.  Disposition Upon Discharge:  Condition: stable for discharge home Home: take medications as prescribed; routine discharge instructions as discussed; follow up as advised.  Patient presented with an acute illness with associated systemic symptoms and  significant discomfort requiring urgent management. In my opinion, this is a condition that a prudent lay person (someone who possesses an average knowledge of health and medicine) may potentially expect to result in complications if not addressed urgently such as respiratory distress, impairment of bodily function or dysfunction of bodily organs.   Routine symptom specific, illness specific and/or disease specific instructions were discussed with the patient and/or caregiver at length.   As such, the patient has been evaluated and assessed, work-up was performed and treatment was provided in alignment with urgent care protocols and evidence based medicine.  Patient/parent/caregiver has been advised that the patient may require follow up for further testing and treatment if the symptoms continue in spite of treatment, as clinically indicated and appropriate.  If the patient was tested for COVID-19, Influenza and/or RSV, then the patient/parent/guardian was advised to isolate at home pending the results of his/her diagnostic coronavirus test and potentially longer if they're positive. I have also advised pt that if his/her COVID-19 test returns positive, it's recommended to self-isolate for at least 10 days after symptoms first appeared AND until fever-free for 24 hours without fever reducer AND other symptoms have improved or resolved. Discussed self-isolation recommendations as well as instructions for household member/close contacts as per the [facility] and [place] [facility], and also gave patient the COVID packet with this information.  Patient/parent/caregiver has been advised to return to the [facility] or PCP in 3-5 days if no better; to PCP or the Emergency Department if new signs and symptoms develop, or if the current signs or symptoms continue to change or worsen for further workup, evaluation and treatment as clinically indicated and appropriate  The patient will follow up with their current PCP if and as advised. If  the patient does not currently have a PCP we will assist them in obtaining one.   The patient may need specialty follow up if the symptoms continue, in spite of conservative treatment and management, for further workup, evaluation, consultation and treatment as clinically indicated and appropriate.  Patient/parent/caregiver verbalized understanding and agreement of plan as discussed.  All questions were addressed during visit.  Please see discharge instructions below for further details of plan.  Discharge Instructions:   Discharge Instructions      I have enclosed some information about COVID-19 and self-care at home that I hope you find helpful.  Is a prescription for Paxlovid, the antiviral medication for COVID-19, to your pharmacy.  While you are taking Paxlovid, please do not take simvastatin as they interfere with each other.  Please also do not take sildenafil while taking Paxlovid.  When you have finished your 5-day treatment with Paxlovid, you are welcome to resume both medications.  For your cough, I sent a prescription for Promethazine DM cough syrup that you can take up to 3 times daily as needed for troubling cough.  You are also welcome to take ibuprofen 400 to 600 mg for body aches and pain  and Tylenol 650 to 1000 mg for fever.  Both can be taken every 6-8 hours and you can also take them at the same time.  I anticipate that you will be feeling better in the next 3 to 5 days.  If your symptoms worsen in the next 3 to 5 days, please return for repeat evaluation.  If you develop severe shortness of breath, become confused or feel altered, please go to the emergency room for further evaluation as this could be due to your oxygen level being too low.  Thank you for visiting urgent care today.      This office note has been dictated using Teaching laboratory technician.  Unfortunately, this method of dictation can sometimes lead to typographical or grammatical errors.  I  apologize for your inconvenience in advance if this occurs.  Please do not hesitate to reach out to me if clarification is needed.      Theadora Rama Scales, PA-C 12/02/22 0930

## 2022-12-01 NOTE — ED Triage Notes (Signed)
Pt here for cough and fever with positive home covid test x 3 days; pt took tylenol this am

## 2022-12-01 NOTE — Discharge Instructions (Signed)
I have enclosed some information about COVID-19 and self-care at home that I hope you find helpful.  Is a prescription for Paxlovid, the antiviral medication for COVID-19, to your pharmacy.  While you are taking Paxlovid, please do not take simvastatin as they interfere with each other.  Please also do not take sildenafil while taking Paxlovid.  When you have finished your 5-day treatment with Paxlovid, you are welcome to resume both medications.  For your cough, I sent a prescription for Promethazine DM cough syrup that you can take up to 3 times daily as needed for troubling cough.  You are also welcome to take ibuprofen 400 to 600 mg for body aches and pain and Tylenol 650 to 1000 mg for fever.  Both can be taken every 6-8 hours and you can also take them at the same time.  I anticipate that you will be feeling better in the next 3 to 5 days.  If your symptoms worsen in the next 3 to 5 days, please return for repeat evaluation.  If you develop severe shortness of breath, become confused or feel altered, please go to the emergency room for further evaluation as this could be due to your oxygen level being too low.  Thank you for visiting urgent care today.

## 2023-01-13 ENCOUNTER — Ambulatory Visit (INDEPENDENT_AMBULATORY_CARE_PROVIDER_SITE_OTHER): Payer: Medicare Other | Admitting: Family Medicine

## 2023-01-13 ENCOUNTER — Encounter: Payer: Self-pay | Admitting: Family Medicine

## 2023-01-13 VITALS — BP 144/83 | HR 86 | Resp 18 | Ht 68.9 in | Wt 235.0 lb

## 2023-01-13 DIAGNOSIS — E1159 Type 2 diabetes mellitus with other circulatory complications: Secondary | ICD-10-CM | POA: Diagnosis not present

## 2023-01-13 DIAGNOSIS — E785 Hyperlipidemia, unspecified: Secondary | ICD-10-CM

## 2023-01-13 DIAGNOSIS — E1169 Type 2 diabetes mellitus with other specified complication: Secondary | ICD-10-CM

## 2023-01-13 DIAGNOSIS — Z7984 Long term (current) use of oral hypoglycemic drugs: Secondary | ICD-10-CM | POA: Diagnosis not present

## 2023-01-13 DIAGNOSIS — I152 Hypertension secondary to endocrine disorders: Secondary | ICD-10-CM

## 2023-01-13 MED ORDER — LISINOPRIL 40 MG PO TABS: 40.0000 mg | ORAL_TABLET | Freq: Every day | ORAL | 1 refills | Status: DC

## 2023-01-13 MED ORDER — DAPAGLIFLOZIN PROPANEDIOL 10 MG PO TABS: 10.0000 mg | ORAL_TABLET | Freq: Every day | ORAL | 1 refills | Status: DC

## 2023-01-13 MED ORDER — GLIPIZIDE 10 MG PO TABS
10.0000 mg | ORAL_TABLET | Freq: Two times a day (BID) | ORAL | 1 refills | Status: DC
Start: 2023-01-13 — End: 2023-07-21

## 2023-01-13 MED ORDER — TRULICITY 3 MG/0.5ML ~~LOC~~ SOAJ
SUBCUTANEOUS | 1 refills | Status: DC
Start: 2023-01-13 — End: 2023-07-21

## 2023-01-13 MED ORDER — AMLODIPINE BESYLATE 5 MG PO TABS: 5.0000 mg | ORAL_TABLET | Freq: Every day | ORAL | 2 refills | Status: DC

## 2023-01-13 MED ORDER — METFORMIN HCL 1000 MG PO TABS
ORAL_TABLET | ORAL | 1 refills | Status: DC
Start: 2023-01-13 — End: 2023-04-21

## 2023-01-13 MED ORDER — SIMVASTATIN 20 MG PO TABS: 20.0000 mg | ORAL_TABLET | Freq: Every day | ORAL | 1 refills | Status: DC

## 2023-01-13 NOTE — Assessment & Plan Note (Signed)
Stable, last A1c 6.6, rechecking today.  Continue Trulicity 3 mg weekly, Farxiga 10 mg daily, glipizide 10 mg twice daily, metformin 1000 mg twice daily.  Completed foot exam today.  Will continue to monitor.

## 2023-01-13 NOTE — Progress Notes (Signed)
Established Patient Office Visit  Subjective   Patient ID: Joseph Collier, male    DOB: 10-Nov-1950  Age: 72 y.o. MRN: [id number]  Chief Complaint  Patient presents with   Diabetes   Hypertension   Hyperlipidemia    HPI Joseph Collier is a 72 y.o. male presenting today for follow up of hypertension, hyperlipidemia, diabetes. Hypertension: Patient here for follow-up of elevated blood pressure. He is not exercising and is not adherent to low salt diet.   Pt denies chest pain, SOB, dizziness, edema, syncope, fatigue or heart palpitations. Taking lisinopril and amlodipine, reports excellent compliance with treatment. Denies side effects.  He has been checking his blood pressures on the machine at [place], they do consistently run high they are. Hyperlipidemia: tolerating simvastatin well with no myalgias or significant side effects.  The ASCVD Risk score (Arnett DK, et al., 2019) failed to calculate for the following reasons:   The valid total cholesterol range is 130 to 320 mg/dL Diabetes: denies hypoglycemic events, wounds or sores that are not healing well, increased thirst or urination. Denies vision problems, eye exam 10/19/2022. Taking Farxiga, glipizide, metformin, Trulicity as prescribed without any side effects.  ROS Negative unless otherwise noted in HPI   Objective:     BP (!) 144/83 (BP Location: Left Arm, Patient Position: Sitting, Cuff Size: Large)   Pulse 86   Resp 18   Ht 5' 8.9" (1.75 m)   Wt 235 lb (106.6 kg)   SpO2 97%   BMI 34.80 kg/m   Physical Exam Constitutional:      General: He is not in acute distress.    Appearance: Normal appearance.  HENT:     Head: Normocephalic and atraumatic.  Cardiovascular:     Rate and Rhythm: Normal rate and regular rhythm.     Pulses: Normal pulses.     Heart sounds: Normal heart sounds. No murmur heard.    No friction rub. No gallop.  Pulmonary:     Effort: Pulmonary effort is normal.     Breath sounds: Normal breath  sounds. No wheezing, rhonchi or rales.  Skin:    General: Skin is warm and dry.  Neurological:     Mental Status: He is alert and oriented to person, place, and time.  Psychiatric:        Mood and Affect: Mood normal.    Diabetic Foot Form - Detailed   Diabetic Foot Exam - detailed Diabetic Foot exam was performed with the following findings: Yes 01/13/2023  9:34 AM  Visual Foot Exam completed.: Yes  Can the patient see the bottom of their feet?: Yes Are the shoes appropriate in style and fit?: Yes Is there swelling or and abnormal foot shape?: No Is there a claw toe deformity?: No Is there elevated skin temparature?: No Is there foot or ankle muscle weakness?: No Normal Range of Motion: Yes Pulse Foot Exam completed.: Yes   Right posterior Tibialias: Present Left posterior Tibialias: Present   Right Dorsalis Pedis: Present Left Dorsalis Pedis: Present  Sensory Foot Exam Completed.: Yes Semmes-Weinstein Monofilament Test R Site 1-Great Toe: Pos L Site 1-Great Toe: Pos          Assessment & Plan:  Hypertension associated with diabetes (HCC) Assessment & Plan: BP goal <130/80.  BP above goal initially 139/83.  On repeat 144/83.  As we discussed management options, patient admits that he does not limit his sodium intake.  Continue lisinopril 40 mg daily, amlodipine 5 mg daily.  He is committed to decreasing his sodium intake over the next 3 months to see if he can meet his blood pressure goals by doing so.  Continue ambulatory blood pressure monitoring.  Orders: -     CBC with Differential/Platelet; Future -     Comprehensive metabolic panel; Future -     amLODIPine Besylate; Take 1 tablet (5 mg total) by mouth daily.  Dispense: 30 tablet; Refill: 2 -     Lisinopril; Take 1 tablet (40 mg total) by mouth daily.  Dispense: 90 tablet; Refill: 1  Hyperlipidemia associated with type 2 diabetes mellitus (HCC) Assessment & Plan: Last lipid panel: LDL 40, HDL 39, triglycerides 162.   Repeating lipid panel today.  Continue simvastatin 20 mg daily.  Will continue to monitor.  Orders: -     Lipid panel; Future -     Simvastatin; Take 1 tablet (20 mg total) by mouth at bedtime.  Dispense: 90 tablet; Refill: 1  Type 2 diabetes mellitus with other specified complication, without long-term current use of insulin (HCC) Assessment & Plan: Stable, last A1c 6.6, rechecking today.  Continue Trulicity 3 mg weekly, Farxiga 10 mg daily, glipizide 10 mg twice daily, metformin 1000 mg twice daily.  Completed foot exam today.  Will continue to monitor.  Orders: -     Hemoglobin A1c; Future -     Dapagliflozin Propanediol; Take 1 tablet (10 mg total) by mouth daily.  Dispense: 90 tablet; Refill: 1 -     glipiZIDE; Take 1 tablet (10 mg total) by mouth 2 (two) times daily before a meal.  Dispense: 180 tablet; Refill: 1 -     metFORMIN HCl; TAKE 1 TABLET BY MOUTH IN THE MORNING AND 1 TABLET BY MOUTH IN THE EVENING WITH MEALS  Dispense: 180 tablet; Refill: 1 -     Trulicity; INJECT 3 MG INTO THE SKIN ONCE A WEEK  Dispense: 12 mL; Refill: 1    Return in about 3 months (around 04/15/2023) for follow-up for HTN, HLD, DM, fasting blood work 1 week before.    Melida Quitter, PA

## 2023-01-13 NOTE — Assessment & Plan Note (Addendum)
BP goal <130/80.  BP above goal initially 139/83.  On repeat 144/83.  As we discussed management options, patient admits that he does not limit his sodium intake.  Continue lisinopril 40 mg daily, amlodipine 5 mg daily.  He is committed to decreasing his sodium intake over the next 3 months to see if he can meet his blood pressure goals by doing so.  Continue ambulatory blood pressure monitoring.

## 2023-01-13 NOTE — Assessment & Plan Note (Signed)
Last lipid panel: LDL 40, HDL 39, triglycerides 162.  Repeating lipid panel today.  Continue simvastatin 20 mg daily.  Will continue to monitor.

## 2023-01-13 NOTE — Patient Instructions (Signed)
It was good to see you today!  Continue checking your blood pressure at home.  The goal is for it to be less than 130/80.  We will continue with your medicines as they are, but make an effort over the next 3 months to decrease your sodium intake.  I have included some additional information in the back of your packet about what your sodium goals should be and how to make them.  I am confident that you will be able to make these changes and avoid needing to increase the medicines!

## 2023-01-14 LAB — COMPREHENSIVE METABOLIC PANEL
ALT: 23 IU/L (ref 0–44)
AST: 18 IU/L (ref 0–40)
Albumin/Globulin Ratio: 2.4 — ABNORMAL HIGH (ref 1.2–2.2)
Albumin: 4.8 g/dL (ref 3.9–4.9)
Alkaline Phosphatase: 58 IU/L (ref 44–121)
BUN/Creatinine Ratio: 15 (ref 10–24)
BUN: 14 mg/dL (ref 8–27)
Bilirubin Total: 0.9 mg/dL (ref 0.0–1.2)
CO2: 23 mmol/L (ref 20–29)
Calcium: 9.7 mg/dL (ref 8.6–10.2)
Chloride: 101 mmol/L (ref 96–106)
Creatinine, Ser: 0.94 mg/dL (ref 0.76–1.27)
Globulin, Total: 2 g/dL (ref 1.5–4.5)
Glucose: 99 mg/dL (ref 70–99)
Potassium: 4.4 mmol/L (ref 3.5–5.2)
Sodium: 140 mmol/L (ref 134–144)
Total Protein: 6.8 g/dL (ref 6.0–8.5)
eGFR: 88 mL/min/{1.73_m2} (ref >59)

## 2023-01-14 LAB — CBC WITH DIFFERENTIAL/PLATELET
Basophils Absolute: 0.1 10*3/uL (ref 0.0–0.2)
Basos: 1 %
EOS (ABSOLUTE): 0.3 10*3/uL (ref 0.0–0.4)
Eos: 3 %
Hematocrit: 44.5 % (ref 37.5–51.0)
Hemoglobin: 15 g/dL (ref 13.0–17.7)
Immature Grans (Abs): 0 10*3/uL (ref 0.0–0.1)
Immature Granulocytes: 0 %
Lymphocytes Absolute: 2.6 10*3/uL (ref 0.7–3.1)
Lymphs: 30 %
MCH: 29.2 pg (ref 26.6–33.0)
MCHC: 33.7 g/dL (ref 31.5–35.7)
MCV: 87 fL (ref 79–97)
Monocytes Absolute: 0.7 10*3/uL (ref 0.1–0.9)
Monocytes: 8 %
Neutrophils Absolute: 5 10*3/uL (ref 1.4–7.0)
Neutrophils: 58 %
Platelets: 326 10*3/uL (ref 150–450)
RBC: 5.13 x10E6/uL (ref 4.14–5.80)
RDW: 12.1 % (ref 11.6–15.4)
WBC: 8.6 10*3/uL (ref 3.4–10.8)

## 2023-01-14 LAB — LIPID PANEL
Chol/HDL Ratio: 2.7 ratio (ref 0.0–5.0)
Cholesterol, Total: 106 mg/dL (ref 100–199)
HDL: 39 mg/dL — ABNORMAL LOW (ref >39)
LDL Chol Calc (NIH): 51 mg/dL (ref 0–99)
Triglycerides: 81 mg/dL (ref 0–149)
VLDL Cholesterol Cal: 16 mg/dL (ref 5–40)

## 2023-01-14 LAB — HEMOGLOBIN A1C
Est. average glucose Bld gHb Est-mCnc: 148 mg/dL
Hgb A1c MFr Bld: 6.8 % — ABNORMAL HIGH (ref 4.8–5.6)

## 2023-02-15 NOTE — Progress Notes (Signed)
Reviewed with patient during subsequent visit.

## 2023-04-02 ENCOUNTER — Other Ambulatory Visit: Payer: Self-pay | Admitting: Family Medicine

## 2023-04-02 DIAGNOSIS — K219 Gastro-esophageal reflux disease without esophagitis: Secondary | ICD-10-CM

## 2023-04-02 DIAGNOSIS — E1159 Type 2 diabetes mellitus with other circulatory complications: Secondary | ICD-10-CM

## 2023-04-16 ENCOUNTER — Other Ambulatory Visit: Payer: Medicare Other

## 2023-04-16 DIAGNOSIS — E1169 Type 2 diabetes mellitus with other specified complication: Secondary | ICD-10-CM | POA: Diagnosis not present

## 2023-04-16 DIAGNOSIS — E1159 Type 2 diabetes mellitus with other circulatory complications: Secondary | ICD-10-CM | POA: Diagnosis not present

## 2023-04-16 DIAGNOSIS — I152 Hypertension secondary to endocrine disorders: Secondary | ICD-10-CM | POA: Diagnosis not present

## 2023-04-16 DIAGNOSIS — E785 Hyperlipidemia, unspecified: Secondary | ICD-10-CM | POA: Diagnosis not present

## 2023-04-17 LAB — COMPREHENSIVE METABOLIC PANEL
ALT: 29 IU/L (ref 0–44)
AST: 21 IU/L (ref 0–40)
Albumin: 4.5 g/dL (ref 3.9–4.9)
Alkaline Phosphatase: 46 IU/L (ref 44–121)
BUN/Creatinine Ratio: 15 (ref 10–24)
BUN: 14 mg/dL (ref 8–27)
Bilirubin Total: 0.5 mg/dL (ref 0.0–1.2)
CO2: 24 mmol/L (ref 20–29)
Calcium: 9.8 mg/dL (ref 8.6–10.2)
Chloride: 104 mmol/L (ref 96–106)
Creatinine, Ser: 0.93 mg/dL (ref 0.76–1.27)
Globulin, Total: 2.3 g/dL (ref 1.5–4.5)
Glucose: 137 mg/dL — ABNORMAL HIGH (ref 70–99)
Potassium: 4.7 mmol/L (ref 3.5–5.2)
Sodium: 143 mmol/L (ref 134–144)
Total Protein: 6.8 g/dL (ref 6.0–8.5)
eGFR: 88 mL/min/{1.73_m2} (ref >59)

## 2023-04-17 LAB — LIPID PANEL
Chol/HDL Ratio: 2.2 ratio (ref 0.0–5.0)
Cholesterol, Total: 94 mg/dL — ABNORMAL LOW (ref 100–199)
HDL: 43 mg/dL (ref >39)
LDL Chol Calc (NIH): 37 mg/dL (ref 0–99)
Triglycerides: 65 mg/dL (ref 0–149)
VLDL Cholesterol Cal: 14 mg/dL (ref 5–40)

## 2023-04-17 LAB — CBC
Hematocrit: 48.5 % (ref 37.5–51.0)
Hemoglobin: 15.4 g/dL (ref 13.0–17.7)
MCH: 29.8 pg (ref 26.6–33.0)
MCHC: 31.8 g/dL (ref 31.5–35.7)
MCV: 94 fL (ref 79–97)
Platelets: 333 10*3/uL (ref 150–450)
RBC: 5.16 x10E6/uL (ref 4.14–5.80)
RDW: 12.1 % (ref 11.6–15.4)
WBC: 8.8 10*3/uL (ref 3.4–10.8)

## 2023-04-17 LAB — HEMOGLOBIN A1C
Est. average glucose Bld gHb Est-mCnc: 146 mg/dL
Hgb A1c MFr Bld: 6.7 % — ABNORMAL HIGH (ref 4.8–5.6)

## 2023-04-21 ENCOUNTER — Ambulatory Visit (INDEPENDENT_AMBULATORY_CARE_PROVIDER_SITE_OTHER): Payer: Medicare Other | Admitting: Family Medicine

## 2023-04-21 ENCOUNTER — Encounter: Payer: Self-pay | Admitting: Family Medicine

## 2023-04-21 VITALS — BP 146/85 | HR 90 | Resp 18 | Ht 68.9 in | Wt 240.0 lb

## 2023-04-21 DIAGNOSIS — E1169 Type 2 diabetes mellitus with other specified complication: Secondary | ICD-10-CM | POA: Diagnosis not present

## 2023-04-21 DIAGNOSIS — Z7984 Long term (current) use of oral hypoglycemic drugs: Secondary | ICD-10-CM

## 2023-04-21 DIAGNOSIS — I152 Hypertension secondary to endocrine disorders: Secondary | ICD-10-CM | POA: Diagnosis not present

## 2023-04-21 DIAGNOSIS — E785 Hyperlipidemia, unspecified: Secondary | ICD-10-CM

## 2023-04-21 DIAGNOSIS — E1159 Type 2 diabetes mellitus with other circulatory complications: Secondary | ICD-10-CM | POA: Diagnosis not present

## 2023-04-21 DIAGNOSIS — R911 Solitary pulmonary nodule: Secondary | ICD-10-CM | POA: Diagnosis not present

## 2023-04-21 MED ORDER — AMLODIPINE BESYLATE 5 MG PO TABS
5.0000 mg | ORAL_TABLET | Freq: Every day | ORAL | 2 refills | Status: DC
Start: 2023-04-21 — End: 2023-07-21

## 2023-04-21 MED ORDER — METFORMIN HCL 1000 MG PO TABS
ORAL_TABLET | ORAL | 1 refills | Status: DC
Start: 2023-04-21 — End: 2023-07-21

## 2023-04-21 NOTE — Progress Notes (Addendum)
Established Patient Office Visit  Subjective   Patient ID: Joseph Collier, male    DOB: [birth year]  Age: 72 y.o. MRN: [id number]  Chief Complaint  Patient presents with   Diabetes   Hyperlipidemia   Hypertension   Ear Fullness    Rt ear feels full and has a clicking sound.     HPI Joseph Collier is a 72 y.o. male presenting today for follow up of hypertension, hyperlipidemia, diabetes. He is also concerned that his right ear has had an occasional clicking sound and feeling of fullness for about 2 weeks. Denies fever, changes in hearing, rhinorrhea, sore throat, pain, itching. Hypertension: Patient here for follow-up of elevated blood pressure. He is not exercising and is not adherent to low salt diet.   Pt denies chest pain, SOB, dizziness, edema, syncope, fatigue or heart palpitations. Taking lisinopril and amlodipine, reports excellent compliance with treatment. Denies side effects. Hyperlipidemia: tolerating simvastatin well with no myalgias or significant side effects.  The ASCVD Risk score (Arnett DK, et al., 2019) failed to calculate for the following reasons:   The valid total cholesterol range is 130 to 320 mg/dL Diabetes: denies hypoglycemic events, wounds or sores that are not healing well, increased thirst or urination. Denies vision problems, eye exam 10/19/2022. Taking Farxiga, glipizide, metformin, Trulicity as prescribed without any side effects.   ROS Negative unless otherwise noted in HPI   Objective:     BP (!) 146/85 (BP Location: Left Arm, Patient Position: Sitting, Cuff Size: Normal)   Pulse 90   Resp 18   Ht 5' 8.9" (1.75 m)   Wt 240 lb (108.9 kg)   SpO2 98%   BMI 35.54 kg/m   Physical Exam Constitutional:      General: He is not in acute distress.    Appearance: Normal appearance.  HENT:     Head: Normocephalic and atraumatic.     Right Ear: Tympanic membrane, ear canal and external ear normal. There is no impacted cerumen.     Left Ear: Tympanic  membrane, ear canal and external ear normal. There is no impacted cerumen.  Cardiovascular:     Rate and Rhythm: Normal rate and regular rhythm.     Heart sounds: Normal heart sounds. No murmur heard.    No friction rub. No gallop.  Pulmonary:     Effort: Pulmonary effort is normal. No respiratory distress.     Breath sounds: Normal breath sounds. No wheezing, rhonchi or rales.  Skin:    General: Skin is warm and dry.  Neurological:     Mental Status: He is alert and oriented to person, place, and time.  Psychiatric:        Mood and Affect: Mood normal.     Assessment & Plan:  Hypertension associated with diabetes (HCC) Assessment & Plan: BP goal <130/80.  BP above goal in office, he needs to locate his home BP monitor in order to check there. Encouraged him to find his monitor and keep a blood pressure log at home, he will let me know if home BP consistently >130/80. Continue lisinopril 40 mg daily, amlodipine 5 mg daily.  CMP within normal limits/stable from last. Will continue to monitor.  Orders: -     amLODIPine Besylate; Take 1 tablet (5 mg total) by mouth daily.  Dispense: 30 tablet; Refill: 2  Type 2 diabetes mellitus with other specified complication, without long-term current use of insulin (HCC) Assessment & Plan: Stable, A1c 6.7.  Continue Trulicity  3 mg weekly, Farxiga 10 mg daily, glipizide 10 mg twice daily, metformin 1000 mg twice daily. Will continue to monitor.  Orders: -     metFORMIN HCl; TAKE 1 TABLET BY MOUTH IN THE MORNING AND 1 TABLET BY MOUTH IN THE EVENING WITH MEALS  Dispense: 180 tablet; Refill: 1  Hyperlipidemia associated with type 2 diabetes mellitus (HCC) Assessment & Plan: Last lipid panel: LDL 37, HDL 43, triglycerides 65. Continue simvastatin 20 mg daily.  Will continue to monitor.   Solitary pulmonary nodule Assessment & Plan: Noted on 10/18/2022.  Patient is low risk but does have a remote history of smoking 2 packs/day prior to 1991.  Given  that he is low risk, follow-up for pulmonary nodules are not necessary, but we may consider a 39-month follow-up if it is discovered that he is not low risk.  Patient verbalized understanding and is agreeable to this plan.   Ear clicking and fullness Discussed referral to ENT versus trial of antihistamine for possible eustachian tube inflammation versus watchful waiting.  He would prefer to watch and wait to start and will contact me if he would like to pursue any other options.  Return in about 3 months (around 07/21/2023) for follow-up for HTN, HLD, DM, POC A1C at visit.    Joseph Quitter, PA

## 2023-04-21 NOTE — Assessment & Plan Note (Signed)
Stable, A1c 6.7.  Continue Trulicity 3 mg weekly, Farxiga 10 mg daily, glipizide 10 mg twice daily, metformin 1000 mg twice daily. Will continue to monitor.

## 2023-04-21 NOTE — Assessment & Plan Note (Signed)
Noted on 10/18/2022.  Patient is low risk but does have a remote history of smoking 2 packs/day prior to 1991.  Given that he is low risk, follow-up for pulmonary nodules are not necessary, but we may consider a 21-month follow-up if it is discovered that he is not low risk.  Patient verbalized understanding and is agreeable to this plan.

## 2023-04-21 NOTE — Assessment & Plan Note (Signed)
BP goal <130/80.  BP above goal in office, he needs to locate his home BP monitor in order to check there. Encouraged him to find his monitor and keep a blood pressure log at home, he will let me know if home BP consistently >130/80. Continue lisinopril 40 mg daily, amlodipine 5 mg daily.  CMP within normal limits/stable from last. Will continue to monitor.

## 2023-04-21 NOTE — Assessment & Plan Note (Signed)
Last lipid panel: LDL 37, HDL 43, triglycerides 65. Continue simvastatin 20 mg daily.  Will continue to monitor.

## 2023-04-28 ENCOUNTER — Encounter: Payer: Self-pay | Admitting: Family Medicine

## 2023-06-29 ENCOUNTER — Other Ambulatory Visit: Payer: Self-pay | Admitting: Family Medicine

## 2023-06-29 DIAGNOSIS — K219 Gastro-esophageal reflux disease without esophagitis: Secondary | ICD-10-CM

## 2023-07-21 ENCOUNTER — Encounter: Payer: Self-pay | Admitting: Family Medicine

## 2023-07-21 ENCOUNTER — Ambulatory Visit (INDEPENDENT_AMBULATORY_CARE_PROVIDER_SITE_OTHER): Payer: Medicare Other | Admitting: Family Medicine

## 2023-07-21 VITALS — BP 128/80 | HR 94 | Resp 18 | Ht 68.9 in | Wt 240.0 lb

## 2023-07-21 DIAGNOSIS — Z23 Encounter for immunization: Secondary | ICD-10-CM

## 2023-07-21 DIAGNOSIS — R918 Other nonspecific abnormal finding of lung field: Secondary | ICD-10-CM | POA: Diagnosis not present

## 2023-07-21 DIAGNOSIS — E1169 Type 2 diabetes mellitus with other specified complication: Secondary | ICD-10-CM

## 2023-07-21 DIAGNOSIS — Z7984 Long term (current) use of oral hypoglycemic drugs: Secondary | ICD-10-CM | POA: Diagnosis not present

## 2023-07-21 DIAGNOSIS — E1159 Type 2 diabetes mellitus with other circulatory complications: Secondary | ICD-10-CM | POA: Diagnosis not present

## 2023-07-21 DIAGNOSIS — I152 Hypertension secondary to endocrine disorders: Secondary | ICD-10-CM | POA: Diagnosis not present

## 2023-07-21 DIAGNOSIS — E785 Hyperlipidemia, unspecified: Secondary | ICD-10-CM | POA: Diagnosis not present

## 2023-07-21 DIAGNOSIS — Z1159 Encounter for screening for other viral diseases: Secondary | ICD-10-CM

## 2023-07-21 LAB — POCT GLYCOSYLATED HEMOGLOBIN (HGB A1C): Hemoglobin A1C: 7 % — AB (ref 4.0–5.6)

## 2023-07-21 MED ORDER — AMLODIPINE BESYLATE 5 MG PO TABS
5.0000 mg | ORAL_TABLET | Freq: Every day | ORAL | 1 refills | Status: DC
Start: 2023-07-21 — End: 2023-10-21

## 2023-07-21 MED ORDER — SIMVASTATIN 20 MG PO TABS
20.0000 mg | ORAL_TABLET | Freq: Every day | ORAL | 1 refills | Status: DC
Start: 2023-07-21 — End: 2023-10-21

## 2023-07-21 MED ORDER — LISINOPRIL 40 MG PO TABS: 40.0000 mg | ORAL_TABLET | Freq: Every day | ORAL | 1 refills | Status: DC

## 2023-07-21 MED ORDER — METFORMIN HCL 1000 MG PO TABS
ORAL_TABLET | ORAL | 1 refills | Status: DC
Start: 2023-07-21 — End: 2023-10-21

## 2023-07-21 MED ORDER — GLIPIZIDE 10 MG PO TABS: 10.0000 mg | ORAL_TABLET | Freq: Two times a day (BID) | ORAL | 1 refills | Status: DC

## 2023-07-21 MED ORDER — DAPAGLIFLOZIN PROPANEDIOL 10 MG PO TABS: 10.0000 mg | ORAL_TABLET | Freq: Every day | ORAL | 1 refills | Status: DC

## 2023-07-21 MED ORDER — TRULICITY 3 MG/0.5ML ~~LOC~~ SOAJ: SUBCUTANEOUS | 1 refills | Status: DC

## 2023-07-21 NOTE — Patient Instructions (Signed)
Keep up the amazing work, I am so excited that your blood pressure has gone down!  I have ordered the chest CT to follow-up on the small nodules that were seen last year.  This should be done sometime in early March.

## 2023-07-21 NOTE — Assessment & Plan Note (Signed)
Repeating chest CT for surveillance in early March (12 months after initial).  If nodules stable and maintain benign appearance, no need for further surveillance imaging.

## 2023-07-21 NOTE — Assessment & Plan Note (Signed)
BP goal <130/80.  BP has improved significantly since last appointment, essentially at goal.  Continue lisinopril 40 mg daily, amlodipine 5 mg daily.  Most recent CMP within normal limits/stable from last. Will continue to monitor.

## 2023-07-21 NOTE — Assessment & Plan Note (Signed)
A1c increased slightly to 7.0.  Patient knows that he needs to exercise more frequently and focus on his nutrition.  He is committed to doing so over the next few months. Continue Trulicity 3 mg weekly, Farxiga 10 mg daily, glipizide 10 mg twice daily, metformin 1000 mg twice daily. Will continue to monitor.

## 2023-07-21 NOTE — Progress Notes (Signed)
Established Patient Office Visit  Subjective   Patient ID: Joseph Collier, male    DOB: 03/20/1951  Age: 72 y.o. MRN: [id number]  Chief Complaint  Patient presents with   Diabetes   Hyperlipidemia   Hypertension    HPI Joseph Collier is a 72 y.o. male presenting today for follow up of hypertension, hyperlipidemia, diabetes.  He would like his flu shot today. Hypertension: Pt denies chest pain, SOB, dizziness, edema, syncope, fatigue or heart palpitations. Taking lisinopril and amlodipine, reports excellent compliance with treatment. Denies side effects. Hyperlipidemia: tolerating simvastatin well with no myalgias or significant side effects.  The ASCVD Risk score (Arnett DK, et al., 2019) failed to calculate for the following reasons:   The valid total cholesterol range is 130 to 320 mg/dL Diabetes: denies hypoglycemic events, wounds or sores that are not healing well, increased thirst or urination. Denies vision problems, eye exam up-to-date. Taking Trulicity, Farxiga, glipizide, metformin as prescribed without any side effects.    Outpatient Medications Prior to Visit  Medication Sig   aspirin EC 81 MG tablet Take 81 mg by mouth daily.   fluticasone (FLONASE) 50 MCG/ACT nasal spray Place 2 sprays into both nostrils daily.   glucose blood test strip 1 each by Other route in the morning, at noon, and at bedtime. Use as instructed   Multiple Vitamin (MULTIVITAMIN) tablet Take 1 tablet by mouth daily.   omeprazole (PRILOSEC) 20 MG capsule Take 1 capsule by mouth once daily   sildenafil (REVATIO) 20 MG tablet TAKE ONE TO THREE TABLETS BY MOUTH PRIOR TO INTERCOURSE AS NEEDED   [DISCONTINUED] amLODipine (NORVASC) 5 MG tablet Take 1 tablet (5 mg total) by mouth daily.   [DISCONTINUED] dapagliflozin propanediol (FARXIGA) 10 MG TABS tablet Take 1 tablet (10 mg total) by mouth daily.   [DISCONTINUED] Dulaglutide (TRULICITY) 3 MG/0.5ML SOPN INJECT 3 MG INTO THE SKIN ONCE A WEEK    [DISCONTINUED] glipiZIDE (GLUCOTROL) 10 MG tablet Take 1 tablet (10 mg total) by mouth 2 (two) times daily before a meal.   [DISCONTINUED] lisinopril (ZESTRIL) 40 MG tablet Take 1 tablet (40 mg total) by mouth daily.   [DISCONTINUED] metFORMIN (GLUCOPHAGE) 1000 MG tablet TAKE 1 TABLET BY MOUTH IN THE MORNING AND 1 TABLET BY MOUTH IN THE EVENING WITH MEALS   [DISCONTINUED] simvastatin (ZOCOR) 20 MG tablet Take 1 tablet (20 mg total) by mouth at bedtime.   No facility-administered medications prior to visit.    ROS Negative unless otherwise noted in HPI   Objective:     BP 128/80 (BP Location: Left Arm, Patient Position: Sitting, Cuff Size: Large)   Pulse 94   Resp 18   Ht 5' 8.9" (1.75 m)   Wt 240 lb (108.9 kg)   SpO2 98%   BMI 35.54 kg/m   Physical Exam Constitutional:      General: He is not in acute distress.    Appearance: Normal appearance.  HENT:     Head: Normocephalic and atraumatic.  Cardiovascular:     Rate and Rhythm: Normal rate and regular rhythm.     Heart sounds: Normal heart sounds. No murmur heard.    No friction rub. No gallop.  Pulmonary:     Effort: Pulmonary effort is normal. No respiratory distress.     Breath sounds: Normal breath sounds. No wheezing, rhonchi or rales.  Skin:    General: Skin is warm and dry.  Neurological:     Mental Status: He is alert and oriented  to person, place, and time.  Psychiatric:        Mood and Affect: Mood normal.     Assessment & Plan:  Type 2 diabetes mellitus with other specified complication, without long-term current use of insulin (HCC) Assessment & Plan: A1c increased slightly to 7.0.  Patient knows that he needs to exercise more frequently and focus on his nutrition.  He is committed to doing so over the next few months. Continue Trulicity 3 mg weekly, Farxiga 10 mg daily, glipizide 10 mg twice daily, metformin 1000 mg twice daily. Will continue to monitor.  Orders: -     POCT glycosylated hemoglobin (Hb  A1C) -     Dapagliflozin Propanediol; Take 1 tablet (10 mg total) by mouth daily.  Dispense: 90 tablet; Refill: 1 -     Trulicity; INJECT 3 MG INTO THE SKIN ONCE A WEEK  Dispense: 12 mL; Refill: 1 -     glipiZIDE; Take 1 tablet (10 mg total) by mouth 2 (two) times daily before a meal.  Dispense: 180 tablet; Refill: 1 -     metFORMIN HCl; TAKE 1 TABLET BY MOUTH IN THE MORNING AND 1 TABLET BY MOUTH IN THE EVENING WITH MEALS  Dispense: 180 tablet; Refill: 1 -     CBC with Differential/Platelet; Future -     Comprehensive metabolic panel; Future -     Hemoglobin A1c; Future  Need for influenza vaccination -     Flu Vaccine Trivalent High Dose (Fluad)  Multiple pulmonary nodules Assessment & Plan: Repeating chest CT for surveillance in early March (12 months after initial).  If nodules stable and maintain benign appearance, no need for further surveillance imaging.  Orders: -     CT CHEST WO CONTRAST; Future  Hypertension associated with diabetes (HCC) Assessment & Plan: BP goal <130/80.  BP has improved significantly since last appointment, essentially at goal.  Continue lisinopril 40 mg daily, amlodipine 5 mg daily.  Most recent CMP within normal limits/stable from last. Will continue to monitor.  Orders: -     amLODIPine Besylate; Take 1 tablet (5 mg total) by mouth daily.  Dispense: 90 tablet; Refill: 1 -     Lisinopril; Take 1 tablet (40 mg total) by mouth daily.  Dispense: 90 tablet; Refill: 1 -     CBC with Differential/Platelet; Future -     Comprehensive metabolic panel; Future  Hyperlipidemia associated with type 2 diabetes mellitus (HCC) Assessment & Plan: Last lipid panel: LDL 37, HDL 43, triglycerides 65. Continue simvastatin 20 mg daily.  Will continue to monitor.  Orders: -     Simvastatin; Take 1 tablet (20 mg total) by mouth at bedtime.  Dispense: 90 tablet; Refill: 1 -     CBC with Differential/Platelet; Future -     Comprehensive metabolic panel; Future -     Lipid  panel; Future    Return in about 3 months (around 10/19/2023) for follow-up for HTN, HLD, DM, fasting blood work 1 week before.    Melida Quitter, PA

## 2023-07-21 NOTE — Assessment & Plan Note (Signed)
Last lipid panel: LDL 37, HDL 43, triglycerides 65. Continue simvastatin 20 mg daily.  Will continue to monitor.

## 2023-07-26 ENCOUNTER — Ambulatory Visit
Admission: RE | Admit: 2023-07-26 | Discharge: 2023-07-26 | Disposition: A | Discharge status: Home / Self Care | Payer: Medicare Other | Source: Ambulatory Visit | Attending: Family Medicine | Admitting: Family Medicine

## 2023-07-26 DIAGNOSIS — I2510 Atherosclerotic heart disease of native coronary artery without angina pectoris: Secondary | ICD-10-CM | POA: Diagnosis not present

## 2023-07-26 DIAGNOSIS — R918 Other nonspecific abnormal finding of lung field: Secondary | ICD-10-CM

## 2023-07-26 DIAGNOSIS — R911 Solitary pulmonary nodule: Secondary | ICD-10-CM | POA: Diagnosis not present

## 2023-09-22 ENCOUNTER — Other Ambulatory Visit: Payer: Self-pay | Admitting: Family Medicine

## 2023-09-22 DIAGNOSIS — K219 Gastro-esophageal reflux disease without esophagitis: Secondary | ICD-10-CM

## 2023-09-23 ENCOUNTER — Encounter: Payer: Self-pay | Admitting: Family Medicine

## 2023-10-14 ENCOUNTER — Other Ambulatory Visit: Payer: Medicare Other

## 2023-10-14 DIAGNOSIS — I152 Hypertension secondary to endocrine disorders: Secondary | ICD-10-CM | POA: Diagnosis not present

## 2023-10-14 DIAGNOSIS — Z1159 Encounter for screening for other viral diseases: Secondary | ICD-10-CM

## 2023-10-14 DIAGNOSIS — E1169 Type 2 diabetes mellitus with other specified complication: Secondary | ICD-10-CM | POA: Diagnosis not present

## 2023-10-14 DIAGNOSIS — E785 Hyperlipidemia, unspecified: Secondary | ICD-10-CM | POA: Diagnosis not present

## 2023-10-14 DIAGNOSIS — E1159 Type 2 diabetes mellitus with other circulatory complications: Secondary | ICD-10-CM | POA: Diagnosis not present

## 2023-10-15 ENCOUNTER — Encounter: Payer: Self-pay | Admitting: Family Medicine

## 2023-10-15 LAB — CBC WITH DIFFERENTIAL/PLATELET
Basophils Absolute: 0.1 10*3/uL (ref 0.0–0.2)
Basos: 1 %
EOS (ABSOLUTE): 0.3 10*3/uL (ref 0.0–0.4)
Eos: 3 %
Hematocrit: 45.9 % (ref 37.5–51.0)
Hemoglobin: 15.8 g/dL (ref 13.0–17.7)
Immature Grans (Abs): 0 10*3/uL (ref 0.0–0.1)
Immature Granulocytes: 0 %
Lymphocytes Absolute: 2.9 10*3/uL (ref 0.7–3.1)
Lymphs: 31 %
MCH: 30.5 pg (ref 26.6–33.0)
MCHC: 34.4 g/dL (ref 31.5–35.7)
MCV: 89 fL (ref 79–97)
Monocytes Absolute: 1 10*3/uL — ABNORMAL HIGH (ref 0.1–0.9)
Monocytes: 11 %
Neutrophils Absolute: 5.1 10*3/uL (ref 1.4–7.0)
Neutrophils: 54 %
Platelets: 350 10*3/uL (ref 150–450)
RBC: 5.18 x10E6/uL (ref 4.14–5.80)
RDW: 12.1 % (ref 11.6–15.4)
WBC: 9.5 10*3/uL (ref 3.4–10.8)

## 2023-10-15 LAB — COMPREHENSIVE METABOLIC PANEL
ALT: 23 [IU]/L (ref 0–44)
AST: 18 [IU]/L (ref 0–40)
Albumin: 4.8 g/dL (ref 3.9–4.9)
Alkaline Phosphatase: 48 [IU]/L (ref 44–121)
BUN/Creatinine Ratio: 15 (ref 10–24)
BUN: 16 mg/dL (ref 8–27)
Bilirubin Total: 0.6 mg/dL (ref 0.0–1.2)
CO2: 24 mmol/L (ref 20–29)
Calcium: 10.3 mg/dL — ABNORMAL HIGH (ref 8.6–10.2)
Chloride: 101 mmol/L (ref 96–106)
Creatinine, Ser: 1.04 mg/dL (ref 0.76–1.27)
Globulin, Total: 2.3 g/dL (ref 1.5–4.5)
Glucose: 113 mg/dL — ABNORMAL HIGH (ref 70–99)
Potassium: 4.8 mmol/L (ref 3.5–5.2)
Sodium: 141 mmol/L (ref 134–144)
Total Protein: 7.1 g/dL (ref 6.0–8.5)
eGFR: 77 mL/min/{1.73_m2} (ref >59)

## 2023-10-15 LAB — LIPID PANEL
Chol/HDL Ratio: 2.7 {ratio} (ref 0.0–5.0)
Cholesterol, Total: 101 mg/dL (ref 100–199)
HDL: 38 mg/dL — ABNORMAL LOW (ref >39)
LDL Chol Calc (NIH): 48 mg/dL (ref 0–99)
Triglycerides: 73 mg/dL (ref 0–149)
VLDL Cholesterol Cal: 15 mg/dL (ref 5–40)

## 2023-10-15 LAB — HEMOGLOBIN A1C
Est. average glucose Bld gHb Est-mCnc: 157 mg/dL
Hgb A1c MFr Bld: 7.1 % — ABNORMAL HIGH (ref 4.8–5.6)

## 2023-10-15 LAB — HEPATITIS C ANTIBODY: Hep C Virus Ab: NONREACTIVE

## 2023-10-21 ENCOUNTER — Ambulatory Visit: Payer: Medicare Other | Admitting: Family Medicine

## 2023-10-21 ENCOUNTER — Encounter: Payer: Self-pay | Admitting: Family Medicine

## 2023-10-21 VITALS — BP 139/74 | HR 80 | Ht 68.9 in | Wt 240.5 lb

## 2023-10-21 DIAGNOSIS — I152 Hypertension secondary to endocrine disorders: Secondary | ICD-10-CM | POA: Diagnosis not present

## 2023-10-21 DIAGNOSIS — H524 Presbyopia: Secondary | ICD-10-CM | POA: Diagnosis not present

## 2023-10-21 DIAGNOSIS — E785 Hyperlipidemia, unspecified: Secondary | ICD-10-CM | POA: Diagnosis not present

## 2023-10-21 DIAGNOSIS — Z7984 Long term (current) use of oral hypoglycemic drugs: Secondary | ICD-10-CM

## 2023-10-21 DIAGNOSIS — H2513 Age-related nuclear cataract, bilateral: Secondary | ICD-10-CM | POA: Diagnosis not present

## 2023-10-21 DIAGNOSIS — E1159 Type 2 diabetes mellitus with other circulatory complications: Secondary | ICD-10-CM

## 2023-10-21 DIAGNOSIS — E1169 Type 2 diabetes mellitus with other specified complication: Secondary | ICD-10-CM | POA: Diagnosis not present

## 2023-10-21 LAB — POCT UA - MICROALBUMIN
Creatinine, POC: 50 mg/dL
Microalbumin Ur, POC: 10 mg/L

## 2023-10-21 LAB — HM DIABETES EYE EXAM

## 2023-10-21 MED ORDER — DAPAGLIFLOZIN PROPANEDIOL 10 MG PO TABS: 10.0000 mg | ORAL_TABLET | Freq: Every day | ORAL | 1 refills | Status: AC

## 2023-10-21 MED ORDER — AMLODIPINE BESYLATE 5 MG PO TABS: 5.0000 mg | ORAL_TABLET | Freq: Every day | ORAL | 1 refills | Status: DC

## 2023-10-21 MED ORDER — METFORMIN HCL 1000 MG PO TABS: ORAL_TABLET | ORAL | 1 refills | Status: DC

## 2023-10-21 MED ORDER — GLIPIZIDE 10 MG PO TABS: 10.0000 mg | ORAL_TABLET | Freq: Two times a day (BID) | ORAL | 1 refills | Status: AC

## 2023-10-21 MED ORDER — SIMVASTATIN 20 MG PO TABS: 20.0000 mg | ORAL_TABLET | Freq: Every day | ORAL | 1 refills | Status: AC

## 2023-10-21 MED ORDER — TRULICITY 4.5 MG/0.5ML ~~LOC~~ SOAJ: 4.5000 mg | SUBCUTANEOUS | 1 refills | Status: DC

## 2023-10-21 MED ORDER — LISINOPRIL 40 MG PO TABS: 40.0000 mg | ORAL_TABLET | Freq: Every day | ORAL | 1 refills | Status: DC

## 2023-10-21 NOTE — Assessment & Plan Note (Signed)
 Last lipid panel: LDL 48, HDL 38, triglycerides 73. Continue simvastatin 20 mg daily.  Will continue to monitor.

## 2023-10-21 NOTE — Progress Notes (Signed)
 Established Patient Office Visit  Subjective   Patient ID: Joseph Collier, male    DOB: [birth year]  Age: 73 y.o. MRN: [id number]  Chief Complaint  Patient presents with   Hypertension   Hyperlipidemia   Diabetes    HPI Joseph Collier is a 73 y.o. male presenting today for follow up of hypertension, hyperlipidemia, diabetes. Hypertension:  Pt denies chest pain, SOB, dizziness, edema, syncope, fatigue or heart palpitations. Taking amlodipine and lisinopril, reports excellent compliance with treatment. Denies side effects. Hyperlipidemia: tolerating simvastatin 20 mg daily well with no myalgias or significant side effects.  The ASCVD Risk score (Arnett DK, et al., 2019) failed to calculate for the following reasons:   The valid total cholesterol range is 130 to 320 mg/dL Diabetes: denies hypoglycemic events, wounds or sores that are not healing well, increased thirst or urination. Denies vision problems, eye exam scheduled for this afternoon at [facility]. Taking Trulicity, glipizide, metformin, Marcelline Deist as prescribed without any side effects.   Outpatient Medications Prior to Visit  Medication Sig   aspirin EC 81 MG tablet Take 81 mg by mouth daily.   fluticasone (FLONASE) 50 MCG/ACT nasal spray Place 2 sprays into both nostrils daily.   glucose blood test strip 1 each by Other route in the morning, at noon, and at bedtime. Use as instructed   Multiple Vitamin (MULTIVITAMIN) tablet Take 1 tablet by mouth daily.   omeprazole (PRILOSEC) 20 MG capsule Take 1 capsule by mouth once daily   sildenafil (REVATIO) 20 MG tablet TAKE ONE TO THREE TABLETS BY MOUTH PRIOR TO INTERCOURSE AS NEEDED   [DISCONTINUED] amLODipine (NORVASC) 5 MG tablet Take 1 tablet (5 mg total) by mouth daily.   [DISCONTINUED] dapagliflozin propanediol (FARXIGA) 10 MG TABS tablet Take 1 tablet (10 mg total) by mouth daily.   [DISCONTINUED] Dulaglutide (TRULICITY) 3 MG/0.5ML SOAJ INJECT 3 MG INTO THE SKIN ONCE  A WEEK   [DISCONTINUED] glipiZIDE (GLUCOTROL) 10 MG tablet Take 1 tablet (10 mg total) by mouth 2 (two) times daily before a meal.   [DISCONTINUED] lisinopril (ZESTRIL) 40 MG tablet Take 1 tablet (40 mg total) by mouth daily.   [DISCONTINUED] metFORMIN (GLUCOPHAGE) 1000 MG tablet TAKE 1 TABLET BY MOUTH IN THE MORNING AND 1 TABLET BY MOUTH IN THE EVENING WITH MEALS   [DISCONTINUED] simvastatin (ZOCOR) 20 MG tablet Take 1 tablet (20 mg total) by mouth at bedtime.   No facility-administered medications prior to visit.    ROS Negative unless otherwise noted in HPI   Objective:     BP 139/74   Pulse 80   Ht 5' 8.9" (1.75 m)   Wt 240 lb 8 oz (109.1 kg)   SpO2 99%   BMI 35.62 kg/m   Physical Exam Constitutional:      General: He is not in acute distress.    Appearance: Normal appearance.  HENT:     Head: Normocephalic and atraumatic.  Cardiovascular:     Rate and Rhythm: Normal rate and regular rhythm.     Heart sounds: Normal heart sounds. No murmur heard.    No friction rub. No gallop.  Pulmonary:     Effort: Pulmonary effort is normal. No respiratory distress.     Breath sounds: Normal breath sounds. No wheezing, rhonchi or rales.  Skin:    General: Skin is warm and dry.  Neurological:     Mental Status: He is alert and oriented to person, place, and time.  Psychiatric:  Mood and Affect: Mood normal.    Results for orders placed or performed in visit on 10/21/23  POCT UA - Microalbumin  Result Value Ref Range   Microalbumin Ur, POC 10 mg/L   Creatinine, POC 50 mg/dL   Albumin/Creatinine Ratio, Urine, POC 30mg /g      Assessment & Plan:  Hypertension associated with diabetes (HCC) Assessment & Plan: BP goal <130/80.  Continue lisinopril 40 mg daily, amlodipine 5 mg daily.  Most recent CMP within normal limits/stable from last, calcium slightly elevated but corrected calcium within normal limits. Will continue to monitor.  Orders: -     amLODIPine Besylate;  Take 1 tablet (5 mg total) by mouth daily.  Dispense: 90 tablet; Refill: 1 -     Lisinopril; Take 1 tablet (40 mg total) by mouth daily.  Dispense: 90 tablet; Refill: 1  Hyperlipidemia associated with type 2 diabetes mellitus (HCC) Assessment & Plan: Last lipid panel: LDL 48, HDL 38, triglycerides 73. Continue simvastatin 20 mg daily.  Will continue to monitor.  Orders: -     Simvastatin; Take 1 tablet (20 mg total) by mouth at bedtime.  Dispense: 90 tablet; Refill: 1  Type 2 diabetes mellitus with other specified complication, without long-term current use of insulin (HCC) Assessment & Plan: A1c increased slightly to 7.1.  A1c continues to gradually increase, patient agreeable to increasing dose of Trulicity to 4.5 mg weekly for improved glycemic control.  Continue Farxiga 10 mg daily, glipizide 10 mg twice daily, metformin 1000 mg twice daily. Will continue to monitor.  Requesting records from eye exam this afternoon, collecting urine sample for albumin/creatinine ratio.  Orders: -     POCT UA - Microalbumin -     Dapagliflozin Propanediol; Take 1 tablet (10 mg total) by mouth daily.  Dispense: 90 tablet; Refill: 1 -     glipiZIDE; Take 1 tablet (10 mg total) by mouth 2 (two) times daily before a meal.  Dispense: 180 tablet; Refill: 1 -     metFORMIN HCl; TAKE 1 TABLET BY MOUTH IN THE MORNING AND 1 TABLET BY MOUTH IN THE EVENING WITH MEALS  Dispense: 180 tablet; Refill: 1 -     Trulicity; Inject 4.5 mg as directed once a week.  Dispense: 6 mL; Refill: 1    Return in about 4 months (around 02/20/2024) for follow-up for HTN, HLD, DM, fasting labs 1 week before.    Melida Quitter, PA

## 2023-10-21 NOTE — Assessment & Plan Note (Signed)
 BP goal <130/80.  Continue lisinopril 40 mg daily, amlodipine 5 mg daily.  Most recent CMP within normal limits/stable from last, calcium slightly elevated but corrected calcium within normal limits. Will continue to monitor.

## 2023-10-21 NOTE — Assessment & Plan Note (Addendum)
 A1c increased slightly to 7.1.  A1c continues to gradually increase, patient agreeable to increasing dose of Trulicity to 4.5 mg weekly for improved glycemic control.  Continue Farxiga 10 mg daily, glipizide 10 mg twice daily, metformin 1000 mg twice daily. Will continue to monitor.  Requesting records from eye exam this afternoon, collecting urine sample for albumin/creatinine ratio.

## 2023-10-22 ENCOUNTER — Encounter: Payer: Self-pay | Admitting: Family Medicine

## 2023-11-10 DIAGNOSIS — H25811 Combined forms of age-related cataract, right eye: Secondary | ICD-10-CM | POA: Diagnosis not present

## 2023-11-10 DIAGNOSIS — H2511 Age-related nuclear cataract, right eye: Secondary | ICD-10-CM | POA: Diagnosis not present

## 2024-02-14 ENCOUNTER — Other Ambulatory Visit: Payer: Self-pay | Admitting: *Deleted

## 2024-02-14 DIAGNOSIS — I152 Hypertension secondary to endocrine disorders: Secondary | ICD-10-CM

## 2024-02-14 DIAGNOSIS — E1169 Type 2 diabetes mellitus with other specified complication: Secondary | ICD-10-CM

## 2024-02-15 ENCOUNTER — Other Ambulatory Visit

## 2024-02-15 DIAGNOSIS — I152 Hypertension secondary to endocrine disorders: Secondary | ICD-10-CM

## 2024-02-15 DIAGNOSIS — E1169 Type 2 diabetes mellitus with other specified complication: Secondary | ICD-10-CM

## 2024-02-15 DIAGNOSIS — E785 Hyperlipidemia, unspecified: Secondary | ICD-10-CM | POA: Diagnosis not present

## 2024-02-15 DIAGNOSIS — E1159 Type 2 diabetes mellitus with other circulatory complications: Secondary | ICD-10-CM | POA: Diagnosis not present

## 2024-02-16 ENCOUNTER — Ambulatory Visit: Payer: Self-pay

## 2024-02-16 LAB — COMPREHENSIVE METABOLIC PANEL WITH GFR
ALT: 20 IU/L (ref 0–44)
AST: 18 IU/L (ref 0–40)
Albumin: 4.7 g/dL (ref 3.8–4.8)
Alkaline Phosphatase: 55 IU/L (ref 44–121)
BUN/Creatinine Ratio: 16 (ref 10–24)
BUN: 16 mg/dL (ref 8–27)
Bilirubin Total: 0.8 mg/dL (ref 0.0–1.2)
CO2: 22 mmol/L (ref 20–29)
Calcium: 10.1 mg/dL (ref 8.6–10.2)
Chloride: 100 mmol/L (ref 96–106)
Creatinine, Ser: 0.99 mg/dL (ref 0.76–1.27)
Globulin, Total: 2.5 g/dL (ref 1.5–4.5)
Glucose: 117 mg/dL — ABNORMAL HIGH (ref 70–99)
Potassium: 4.8 mmol/L (ref 3.5–5.2)
Sodium: 140 mmol/L (ref 134–144)
Total Protein: 7.2 g/dL (ref 6.0–8.5)
eGFR: 81 mL/min/{1.73_m2} (ref >59)

## 2024-02-16 LAB — LIPID PANEL
Chol/HDL Ratio: 3 ratio (ref 0.0–5.0)
Cholesterol, Total: 110 mg/dL (ref 100–199)
HDL: 37 mg/dL — ABNORMAL LOW (ref >39)
LDL Chol Calc (NIH): 54 mg/dL (ref 0–99)
Triglycerides: 99 mg/dL (ref 0–149)
VLDL Cholesterol Cal: 19 mg/dL (ref 5–40)

## 2024-02-16 LAB — TSH: TSH: 2.08 u[IU]/mL (ref 0.450–4.500)

## 2024-02-16 LAB — HEMOGLOBIN A1C
Est. average glucose Bld gHb Est-mCnc: 154 mg/dL
Hgb A1c MFr Bld: 7 % — ABNORMAL HIGH (ref 4.8–5.6)

## 2024-02-16 LAB — CBC WITH DIFFERENTIAL/PLATELET
Basophils Absolute: 0.1 10*3/uL (ref 0.0–0.2)
Basos: 1 %
EOS (ABSOLUTE): 0.2 10*3/uL (ref 0.0–0.4)
Eos: 2 %
Hematocrit: 48.8 % (ref 37.5–51.0)
Hemoglobin: 16.2 g/dL (ref 13.0–17.7)
Immature Grans (Abs): 0 10*3/uL (ref 0.0–0.1)
Immature Granulocytes: 0 %
Lymphocytes Absolute: 3.2 10*3/uL — ABNORMAL HIGH (ref 0.7–3.1)
Lymphs: 32 %
MCH: 30.8 pg (ref 26.6–33.0)
MCHC: 33.2 g/dL (ref 31.5–35.7)
MCV: 93 fL (ref 79–97)
Monocytes Absolute: 0.9 10*3/uL (ref 0.1–0.9)
Monocytes: 9 %
Neutrophils Absolute: 5.6 10*3/uL (ref 1.4–7.0)
Neutrophils: 56 %
Platelets: 336 10*3/uL (ref 150–450)
RBC: 5.26 x10E6/uL (ref 4.14–5.80)
RDW: 12.5 % (ref 11.6–15.4)
WBC: 10 10*3/uL (ref 3.4–10.8)

## 2024-02-21 ENCOUNTER — Ambulatory Visit (INDEPENDENT_AMBULATORY_CARE_PROVIDER_SITE_OTHER)

## 2024-02-21 VITALS — BP 143/78 | HR 95 | Temp 97.8°F | Ht 68.9 in | Wt 234.0 lb

## 2024-02-21 DIAGNOSIS — N529 Male erectile dysfunction, unspecified: Secondary | ICD-10-CM | POA: Diagnosis not present

## 2024-02-21 DIAGNOSIS — E785 Hyperlipidemia, unspecified: Secondary | ICD-10-CM | POA: Diagnosis not present

## 2024-02-21 DIAGNOSIS — E1159 Type 2 diabetes mellitus with other circulatory complications: Secondary | ICD-10-CM | POA: Diagnosis not present

## 2024-02-21 DIAGNOSIS — Z7984 Long term (current) use of oral hypoglycemic drugs: Secondary | ICD-10-CM

## 2024-02-21 DIAGNOSIS — I152 Hypertension secondary to endocrine disorders: Secondary | ICD-10-CM | POA: Diagnosis not present

## 2024-02-21 DIAGNOSIS — E1169 Type 2 diabetes mellitus with other specified complication: Secondary | ICD-10-CM

## 2024-02-21 MED ORDER — SILDENAFIL CITRATE 20 MG PO TABS: ORAL_TABLET | ORAL | 0 refills | Status: AC

## 2024-02-21 MED ORDER — AMLODIPINE BESYLATE 10 MG PO TABS: 10.0000 mg | ORAL_TABLET | Freq: Every day | ORAL | 2 refills | Status: AC

## 2024-02-21 NOTE — Assessment & Plan Note (Signed)
 Stable.  Refill sent in for Revatio  20 mg.

## 2024-02-21 NOTE — Assessment & Plan Note (Signed)
 Patient has lost 6 and half pounds since his last visit.  Encouraged him to continue with dietary changes and 4.5 mg of Trulicity  injection once weekly to assist with weight loss.  Will continue to monitor.

## 2024-02-21 NOTE — Assessment & Plan Note (Signed)
 Last lipid panel: LDL 54, HDL 37, triglycerides 99.  Continue simvastatin  20 mg daily.  Most recent CMP within normal limits.  Will continue to monitor.

## 2024-02-21 NOTE — Progress Notes (Signed)
 Established Patient Office Visit  Subjective   Patient ID: Joseph Collier, male    DOB: 1951/06/30  Age: 73 y.o. MRN: [id number]  Chief Complaint  Patient presents with   Medical Management of Chronic Issues    HPI  Joseph Collier is a 73 y.o. y/o male who presents to the clinic today for follow up on HTN, HLD, DM.   HTN: Patient currently taking lisinopril  and amlodipine  for control of their blood pressure. Reports excellence compliance with this medication. Denies side effects or episodes of hypotension. Checks BP at home periodically at his [facility] pharmacy and reports his readings at home range between 135-150 SBP. Denies CP, SOB, Palpitations, vision changes, HA, or edema.   HLD: Patient currently taking simvastatin  daily for their cholesterol. Reports excellent compliance with this medication. Denies side effects/new myalgias.   DM: Patient currently takes metformin , glipizide , mounjaro, and trulicity  for management of their diabetes. Reports that they are taking this medication as prescribed. Denies side effects. Denies episodes of hypoglycemia. Denies new sores/wounds that are not healing.      ROS Per HPI.    Objective:     BP (!) 143/78   Pulse 95   Temp 97.8 F (36.6 C) (Oral)   Ht 5' 8.9 (1.75 m)   Wt 234 lb (106.1 kg)   SpO2 98%   BMI 34.66 kg/m    Physical Exam Constitutional:      General: He is not in acute distress.    Appearance: Normal appearance.  Cardiovascular:     Rate and Rhythm: Normal rate and regular rhythm.     Heart sounds: Normal heart sounds. No murmur heard.    No friction rub. No gallop.  Pulmonary:     Effort: Pulmonary effort is normal. No respiratory distress.     Breath sounds: Normal breath sounds.  Abdominal:     General: Abdomen is flat. Bowel sounds are normal.     Palpations: Abdomen is soft.  Musculoskeletal:        General: No swelling.  Skin:    General: Skin is warm and dry.  Neurological:     General: No  focal deficit present.     Mental Status: He is alert.  Psychiatric:        Mood and Affect: Mood normal.        Behavior: Behavior normal.        Thought Content: Thought content normal.    No results found for any visits on 02/21/24.  Last CBC Lab Results  Component Value Date   WBC 10.0 02/15/2024   HGB 16.2 02/15/2024   HCT 48.8 02/15/2024   MCV 93 02/15/2024   MCH 30.8 02/15/2024   RDW 12.5 02/15/2024   PLT 336 02/15/2024   Last metabolic panel Lab Results  Component Value Date   GLUCOSE 117 (H) 02/15/2024   NA 140 02/15/2024   K 4.8 02/15/2024   CL 100 02/15/2024   CO2 22 02/15/2024   BUN 16 02/15/2024   CREATININE 0.99 02/15/2024   EGFR 81 02/15/2024   CALCIUM 10.1 02/15/2024   PROT 7.2 02/15/2024   ALBUMIN 4.7 02/15/2024   LABGLOB 2.5 02/15/2024   AGRATIO 2.4 (H) 01/13/2023   BILITOT 0.8 02/15/2024   ALKPHOS 55 02/15/2024   AST 18 02/15/2024   ALT 20 02/15/2024   Last lipids Lab Results  Component Value Date   CHOL 110 02/15/2024   HDL 37 (L) 02/15/2024   LDLCALC 54 02/15/2024  TRIG 99 02/15/2024   CHOLHDL 3.0 02/15/2024   Last hemoglobin A1c Lab Results  Component Value Date   HGBA1C 7.0 (H) 02/15/2024   Last thyroid  functions Lab Results  Component Value Date   TSH 2.080 02/15/2024      The ASCVD Risk score (Arnett DK, et al., 2019) failed to calculate for the following reasons:   The valid total cholesterol range is 130 to 320 mg/dL    Assessment & Plan:   Hypertension associated with diabetes (HCC) Assessment & Plan: BP goal <130/80.  Blood pressure above goal in office today.  Patient reports his blood pressure is above goal on ambulatory blood pressure monitoring as well.  Continue lisinopril  40 mg daily.  Have increased amlodipine  from 5 mg to 10 mg daily.  Most recent CMP within normal limits.  Sent patient home with a blood pressure log to monitor blood pressure periodically at home.  Advised him to bring this log with him to  his next appointment and/or notify if he begins to experience episodes of low blood pressure/dizziness/faintness upon standing.  Will reassess blood pressure in 3 months.   Erectile dysfunction, unspecified erectile dysfunction type Assessment & Plan: Stable.  Refill sent in for Revatio  20 mg.  Orders: -     Sildenafil  Citrate; TAKE ONE TO THREE TABLETS BY MOUTH PRIOR TO INTERCOURSE AS NEEDED  Dispense: 30 tablet; Refill: 0  Morbid obesity (HCC) Assessment & Plan: Patient has lost 6 and half pounds since his last visit.  Encouraged him to continue with dietary changes and 4.5 mg of Trulicity  injection once weekly to assist with weight loss.  Will continue to monitor.   Hyperlipidemia associated with type 2 diabetes mellitus (HCC) Assessment & Plan: Last lipid panel: LDL 54, HDL 37, triglycerides 99.  Continue simvastatin  20 mg daily.  Most recent CMP within normal limits.  Will continue to monitor.   Type 2 diabetes mellitus with other specified complication, without long-term current use of insulin  (HCC) Assessment & Plan: A1c now stable at 7.0.  Continue Trulicity  4.5 mg weekly, Farxiga  10 mg daily, glipizide  10 mg twice daily, metformin  1000 mg twice daily.  Patient deferred foot exam today.  Eye exam, UACR up-to-date.  Will reassess A1c in 3 months and make medication adjustments as needed.   Other orders -     amLODIPine  Besylate; Take 1 tablet (10 mg total) by mouth daily.  Dispense: 90 tablet; Refill: 2   Return in about 3 months (around 05/23/2024) for HTN.    Saddie JULIANNA Sacks, PA-C

## 2024-02-21 NOTE — Assessment & Plan Note (Signed)
 A1c now stable at 7.0.  Continue Trulicity  4.5 mg weekly, Farxiga  10 mg daily, glipizide  10 mg twice daily, metformin  1000 mg twice daily.  Patient deferred foot exam today.  Eye exam, UACR up-to-date.  Will reassess A1c in 3 months and make medication adjustments as needed.

## 2024-02-21 NOTE — Patient Instructions (Signed)
 It was nice to see you today!  As we discussed in clinic:  -Continue your current medications for diabetes.  Your A1c is stable, so we can just continue this current regimen for now. - I have increased your amlodipine  dose from 5 mg to 10 mg for a little more blood pressure control.  You can take 2 of your 5 mg tablets until you run out, then I have sent in the 10 mg tablets for you to take daily thereafter.  Continue her lisinopril  40 mg for blood pressure. - Continue simvastatin  daily for cholesterol control. -I am sending you home with a blood pressure log.  Please check blood pressure a few times a week and record the readings.  If your readings are running consistently low or you are experiencing dizziness/faintness upon standing, please let me know so we can adjust the medication.  Dose. -I will plan on seeing you back in 3 months for follow-up on blood pressure.  If you have any problems before your next visit feel free to message me via MyChart (minor issues or questions) or call the office, otherwise you may reach out to schedule an office visit.  Thank you! Saddie Sacks, PA-C

## 2024-02-21 NOTE — Assessment & Plan Note (Signed)
 BP goal <130/80.  Blood pressure above goal in office today.  Patient reports his blood pressure is above goal on ambulatory blood pressure monitoring as well.  Continue lisinopril  40 mg daily.  Have increased amlodipine  from 5 mg to 10 mg daily.  Most recent CMP within normal limits.  Sent patient home with a blood pressure log to monitor blood pressure periodically at home.  Advised him to bring this log with him to his next appointment and/or notify if he begins to experience episodes of low blood pressure/dizziness/faintness upon standing.  Will reassess blood pressure in 3 months.

## 2024-03-12 ENCOUNTER — Other Ambulatory Visit: Payer: Self-pay | Admitting: Family Medicine

## 2024-03-12 DIAGNOSIS — K219 Gastro-esophageal reflux disease without esophagitis: Secondary | ICD-10-CM

## 2024-03-31 ENCOUNTER — Other Ambulatory Visit: Payer: Self-pay | Admitting: Family Medicine

## 2024-03-31 DIAGNOSIS — E1169 Type 2 diabetes mellitus with other specified complication: Secondary | ICD-10-CM

## 2024-05-10 ENCOUNTER — Telehealth: Payer: Self-pay | Admitting: *Deleted

## 2024-05-10 NOTE — Telephone Encounter (Signed)
 Copied from CRM #[id number]. Topic: Clinical - Medical Advice >> May 10, 2024  1:07 PM Joseph Collier wrote: Reason for CRM: Tested positive for Covid 19, Fever is gone but still bodyaches. Does he need to be seen by a provider.

## 2024-05-10 NOTE — Telephone Encounter (Signed)
**Note De-identified  [place] Obfuscation** Please advise 

## 2024-05-10 NOTE — Telephone Encounter (Signed)
 Pt informed of below.

## 2024-05-10 NOTE — Telephone Encounter (Signed)
 As long as fever has resolved and he is improving each day, I don't need to see him. He can do 1000 mg tylenol  every 8 hours as needed for the body aches. It may take up to 2 weeks for him to feel back to himself.  If he begins to experience chest pain, fevers, or shortness of breath, he should call back to be seen. Thanks!

## 2024-05-23 ENCOUNTER — Ambulatory Visit (INDEPENDENT_AMBULATORY_CARE_PROVIDER_SITE_OTHER)

## 2024-05-23 VITALS — BP 138/82 | HR 96 | Ht 68.9 in | Wt 230.8 lb

## 2024-05-23 DIAGNOSIS — Z23 Encounter for immunization: Secondary | ICD-10-CM

## 2024-05-23 DIAGNOSIS — E1169 Type 2 diabetes mellitus with other specified complication: Secondary | ICD-10-CM

## 2024-05-23 DIAGNOSIS — Z7984 Long term (current) use of oral hypoglycemic drugs: Secondary | ICD-10-CM | POA: Diagnosis not present

## 2024-05-23 DIAGNOSIS — I152 Hypertension secondary to endocrine disorders: Secondary | ICD-10-CM

## 2024-05-23 DIAGNOSIS — E1159 Type 2 diabetes mellitus with other circulatory complications: Secondary | ICD-10-CM

## 2024-05-23 NOTE — Patient Instructions (Signed)
 VISIT SUMMARY: You had a follow-up appointment today to discuss your recent COVID-19 infection and ongoing management of hypertension, diabetes, and hyperlipidemia. You are feeling better after your COVID-19 infection, and we reviewed your current medications and health maintenance.  YOUR PLAN: -RECENT COVID-19 INFECTION: You recently had a COVID-19 infection but are now feeling better with no ongoing symptoms. It seems you likely contracted it from coworkers. No further action is needed at this time.  -ESSENTIAL HYPERTENSION: Hypertension means high blood pressure. Your blood pressure is generally well-controlled with your current medications, lisinopril  and amlodipine  10 mg. Continue taking these medications as prescribed. Monitor your blood pressure at home 3-4 times a week and record the readings in the new blood pressure log provided. We also checked your blood pressure manually before you left the office.  -TYPE 2 DIABETES MELLITUS: Type 2 diabetes is a condition where your body does not use insulin  properly, leading to high blood sugar levels. Your last A1c was 7%, which is a measure of your average blood sugar levels over the past three months. We will schedule a lab appointment for you to check your A1c again in eight weeks.  -GENERAL HEALTH MAINTENANCE: You agreed to receive the flu vaccine today to help protect you from the flu. We administered the flu vaccine during your visit.  INSTRUCTIONS: Please continue to monitor your blood pressure at home 3-4 times a week and record the readings in the new log provided. We will schedule a lab appointment for you to check your A1c in eight weeks. If you have any concerns or experience any new symptoms, please contact our office.  If you have any problems before your next visit feel free to message me via MyChart (minor issues or questions) or call the office, otherwise you may reach out to schedule an office visit.  Thank you! Saddie Sacks, PA-C

## 2024-05-23 NOTE — Assessment & Plan Note (Signed)
 Patient has lost 10 lbs over the last several months. Encouraged him to continue with dietary changes and 4.5 mg of Trulicity  injection once weekly to assist with weight loss. Will continue to monitor.

## 2024-05-23 NOTE — Assessment & Plan Note (Signed)
 BP Goal <130/80. Above goal initially in office and then slightly above goal on recheck. Blood pressure generally at goal with occasional elevations. Current regimen effective. Risk of hypotension with additional medication at this time.  - Continue lisinopril  40 mg and amlodipine  10 mg. - Monitor blood pressure at home 3-4 times a week. - Provide new blood pressure log for home monitoring. - If BP still elevated at next visit in 8 weeks, consider adding hydrochlorothiazide to regimen.

## 2024-05-23 NOTE — Progress Notes (Signed)
 Established Patient Office Visit  Subjective   Patient ID: Joseph Collier, male    DOB: 20-Mar-1951  Age: 73 y.o. MRN: [id number]  Chief Complaint  Patient presents with   Hypertension    HPI  History of Present Illness   Joseph Collier is a 73 year old male with hypertension who presents for follow-up.  Recent covid-19 infection - Contracted COVID-19 after three days of persistent symptoms initially mistaken for flu or allergies - Experienced fever prompting COVID-19 testing, which was positive - Fever resolved by the evening of the same day as positive test - Currently feeling improved with no ongoing symptoms - No household contacts contracted COVID-19; likely exposure from coworkers  Hypertension management - Currently taking amlodipine , increased from 5 mg to 10 mg daily three months ago - Due to pharmacy error, received 5 mg tablets and has been taking 5 mg twice daily to use up supply - Also taking lisinopril  40 mg daily - Monitors blood pressure at home and records readings - Lost blood pressure log but has been taking pictures of readings - BP's averaging between 105-135 / 70's-80's          ROS Per HPI.    Objective:     BP 138/82   Pulse 96   Ht 5' 8.9 (1.75 m)   Wt 230 lb 12 oz (104.7 kg)   SpO2 97%   BMI 34.17 kg/m    Physical Exam Constitutional:      General: He is not in acute distress.    Appearance: Normal appearance.  Cardiovascular:     Rate and Rhythm: Normal rate and regular rhythm.     Heart sounds: Normal heart sounds. No murmur heard.    No friction rub. No gallop.  Pulmonary:     Effort: Pulmonary effort is normal. No respiratory distress.     Breath sounds: Normal breath sounds.  Musculoskeletal:        General: No swelling.  Skin:    General: Skin is warm and dry.  Neurological:     General: No focal deficit present.     Mental Status: He is alert.  Psychiatric:        Mood and Affect: Mood normal.         Behavior: Behavior normal.        Thought Content: Thought content normal.      No results found for any visits on 05/23/24.  Last CBC Lab Results  Component Value Date   WBC 10.0 02/15/2024   HGB 16.2 02/15/2024   HCT 48.8 02/15/2024   MCV 93 02/15/2024   MCH 30.8 02/15/2024   RDW 12.5 02/15/2024   PLT 336 02/15/2024   Last metabolic panel Lab Results  Component Value Date   GLUCOSE 117 (H) 02/15/2024   NA 140 02/15/2024   K 4.8 02/15/2024   CL 100 02/15/2024   CO2 22 02/15/2024   BUN 16 02/15/2024   CREATININE 0.99 02/15/2024   EGFR 81 02/15/2024   CALCIUM 10.1 02/15/2024   PROT 7.2 02/15/2024   ALBUMIN 4.7 02/15/2024   LABGLOB 2.5 02/15/2024   AGRATIO 2.4 (H) 01/13/2023   BILITOT 0.8 02/15/2024   ALKPHOS 55 02/15/2024   AST 18 02/15/2024   ALT 20 02/15/2024   Last lipids Lab Results  Component Value Date   CHOL 110 02/15/2024   HDL 37 (L) 02/15/2024   LDLCALC 54 02/15/2024   TRIG 99 02/15/2024   CHOLHDL 3.0 02/15/2024  Last hemoglobin A1c Lab Results  Component Value Date   HGBA1C 7.0 (H) 02/15/2024   Last thyroid  functions Lab Results  Component Value Date   TSH 2.080 02/15/2024   Last vitamin D  Lab Results  Component Value Date   VD25OH 49.5 06/16/2019      The ASCVD Risk score (Arnett DK, et al., 2019) failed to calculate for the following reasons:   The valid total cholesterol range is 130 to 320 mg/dL    Assessment & Plan:   Encounter for vaccination -     Flu vaccine HIGH DOSE PF(Fluzone Trivalent)  Hypertension associated with diabetes (HCC) Assessment & Plan: BP Goal <130/80. Above goal initially in office and then slightly above goal on recheck. Blood pressure generally at goal with occasional elevations. Current regimen effective. Risk of hypotension with additional medication at this time.  - Continue lisinopril  40 mg and amlodipine  10 mg. - Monitor blood pressure at home 3-4 times a week. - Provide new blood pressure log  for home monitoring. - If BP still elevated at next visit in 8 weeks, consider adding hydrochlorothiazide to regimen.   Morbid obesity (HCC) Assessment & Plan: Patient has lost 10 lbs over the last several months. Encouraged him to continue with dietary changes and 4.5 mg of Trulicity  injection once weekly to assist with weight loss. Will continue to monitor.    Type 2 diabetes mellitus with other specified complication, without long-term current use of insulin  (HCC) Assessment & Plan: Last A1c at goal at 7.0%. Continue Trulicity  4.5 mg weekly, Farxiga  10 mg daily, glipizide  10 mg twice daily, metformin  1000 mg twice daily. Patient deferred foot exam today. Eye exam, UACR up-to-date. Will reassess A1c in 3 months and make medication adjustments as needed.      Return in about 8 weeks (around 07/18/2024) for HTN, DM.    Saddie JULIANNA Sacks, PA-C

## 2024-05-23 NOTE — Assessment & Plan Note (Signed)
 Last A1c at goal at 7.0%. Continue Trulicity  4.5 mg weekly, Farxiga  10 mg daily, glipizide  10 mg twice daily, metformin  1000 mg twice daily. Patient deferred foot exam today. Eye exam, UACR up-to-date. Will reassess A1c in 3 months and make medication adjustments as needed.

## 2024-05-30 ENCOUNTER — Telehealth: Admitting: Physician Assistant

## 2024-05-30 DIAGNOSIS — K529 Noninfective gastroenteritis and colitis, unspecified: Secondary | ICD-10-CM

## 2024-05-30 MED ORDER — ONDANSETRON 4 MG PO TBDP: 4.0000 mg | ORAL_TABLET | Freq: Three times a day (TID) | ORAL | 0 refills | Status: AC | PRN

## 2024-05-30 MED ORDER — LOPERAMIDE HCL 2 MG PO CAPS: ORAL_CAPSULE | ORAL | 0 refills | Status: AC

## 2024-05-30 NOTE — Progress Notes (Signed)
 Virtual Visit Consent   Joseph Collier, you are scheduled for a virtual visit with a [facility] provider today. Just as with appointments in the office, your consent must be obtained to participate. Your consent will be active for this visit and any virtual visit you may have with one of our providers in the next 365 days. If you have a MyChart account, a copy of this consent can be sent to you electronically.  As this is a virtual visit, video technology does not allow for your provider to perform a traditional examination. This may limit your provider's ability to fully assess your condition. If your provider identifies any concerns that need to be evaluated in person or the need to arrange testing (such as labs, EKG, etc.), we will make arrangements to do so. Although advances in technology are sophisticated, we cannot ensure that it will always work on either your end or our end. If the connection with a video visit is poor, the visit may have to be switched to a telephone visit. With either a video or telephone visit, we are not always able to ensure that we have a secure connection.  By engaging in this virtual visit, you consent to the provision of healthcare and authorize for your insurance to be billed (if applicable) for the services provided during this visit. Depending on your insurance coverage, you may receive a charge related to this service.  I need to obtain your verbal consent now. Are you willing to proceed with your visit today? [name] Wease has provided verbal consent on 05/30/2024 for a virtual visit (video or telephone). Joseph Collier, [place]  Date: 05/30/2024 9:02 AM   Virtual Visit via Video Note   I, Joseph Collier, connected with  KWESI SANGHA  ([id number], [birth year]) on 05/30/24 at  8:30 AM EDT by a video-enabled telemedicine application and verified that I am speaking with the correct person using two identifiers.  Location: Patient: Virtual Visit  Location Patient: Home Provider: Virtual Visit Location Provider: Home Office   I discussed the limitations of evaluation and management by telemedicine and the availability of in person appointments. The patient expressed understanding and agreed to proceed.    History of Present Illness: Joseph Collier is a 73 y.o. who identifies as a male who was assigned male at birth, and is being seen today for loose stools. Notes starting Saturday evening into Sunday with diarrhea every 30-45 minutes. Notes very loose and small amounts each time. Usually one push and done. Would be fine until almost the next hour when the urgency would return.Questions borderline fever Saturday but none since. Some nausea. Episode of non-bloody emesis yesterday. Abdominal cramping just before need to defecate. Otherwise no abdominal pain. Denies blood in the stool. Denies recent travel or known sick contact. No abnormal food source.  OTC -- Imodium -- limited benefit with one dose. Pepto Bismol -- slightly helpful. Is hydrating well -- drinking plenty of fluids.      HPI: HPI  Problems:  Patient Active Problem List   Diagnosis Date Noted   Multiple pulmonary nodules 07/21/2023   Bilateral hearing loss 09/17/2022   Allergic rhinitis 03/07/2016   Morbid obesity (HCC) 03/07/2016   History of tobacco abuse- less than 30 pack year history quit 1991 03/07/2016   Type 2 diabetes mellitus with other specified complication (HCC) 05/24/2015   Erectile dysfunction 05/10/2014   GERD (gastroesophageal reflux disease) 02/05/2014   Degenerative disc disease, cervical 10/30/2013  Hyperlipidemia associated with type 2 diabetes mellitus (HCC) 09/05/2012   Hypertension associated with diabetes (HCC) 09/05/2012    Allergies:  Allergies  Allergen Reactions   Penicillins Hives and Swelling   Saxagliptin  Other (See Comments)   Tetracyclines & Related Hives and Swelling   Medications:  Current Outpatient Medications:     loperamide (IMODIUM) 2 MG capsule, Take 4 mg once, then 2 mg after each loose stool up to total of 16 mg/day., Disp: 30 capsule, Rfl: 0   ondansetron (ZOFRAN-ODT) 4 MG disintegrating tablet, Take 1 tablet (4 mg total) by mouth every 8 (eight) hours as needed for nausea or vomiting., Disp: 20 tablet, Rfl: 0   amLODipine  (NORVASC ) 10 MG tablet, Take 1 tablet (10 mg total) by mouth daily., Disp: 90 tablet, Rfl: 2   aspirin EC 81 MG tablet, Take 81 mg by mouth daily., Disp: , Rfl:    dapagliflozin  propanediol (FARXIGA ) 10 MG TABS tablet, Take 1 tablet (10 mg total) by mouth daily., Disp: 90 tablet, Rfl: 1   fluticasone  (FLONASE ) 50 MCG/ACT nasal spray, Place 2 sprays into both nostrils daily., Disp: 16 g, Rfl: 1   glipiZIDE  (GLUCOTROL ) 10 MG tablet, Take 1 tablet (10 mg total) by mouth 2 (two) times daily before a meal., Disp: 180 tablet, Rfl: 1   glucose blood test strip, 1 each by Other route in the morning, at noon, and at bedtime. Use as instructed, Disp: 100 each, Rfl: 12   lisinopril  (ZESTRIL ) 40 MG tablet, Take 1 tablet (40 mg total) by mouth daily., Disp: 90 tablet, Rfl: 1   metFORMIN  (GLUCOPHAGE ) 1000 MG tablet, TAKE 1 TABLET BY MOUTH IN THE MORNING AND 1 TABLET BY MOUTH IN THE EVENING WITH MEALS, Disp: 180 tablet, Rfl: 1   Multiple Vitamin (MULTIVITAMIN) tablet, Take 1 tablet by mouth daily., Disp: , Rfl:    omeprazole  (PRILOSEC) 20 MG capsule, Take 1 capsule by mouth once daily, Disp: 90 capsule, Rfl: 0   sildenafil  (REVATIO ) 20 MG tablet, TAKE ONE TO THREE TABLETS BY MOUTH PRIOR TO INTERCOURSE AS NEEDED, Disp: 30 tablet, Rfl: 0   simvastatin  (ZOCOR ) 20 MG tablet, Take 1 tablet (20 mg total) by mouth at bedtime., Disp: 90 tablet, Rfl: 1   TRULICITY  4.5 MG/0.5ML SOAJ, INJECT 4.5 MG INTO THE SKIN ONCE A WEEK, Disp: 12 mL, Rfl: 0  Observations/Objective: Patient is well-developed, well-nourished in no acute distress.  Resting comfortably at home.  Head is normocephalic, atraumatic.  No  labored breathing.  Speech is clear and coherent with logical content.  Patient is alert and oriented at baseline.   Assessment and Plan: 1. Gastroenteritis (Primary) - loperamide (IMODIUM) 2 MG capsule; Take 4 mg once, then 2 mg after each loose stool up to total of 16 mg/day.  Dispense: 30 capsule; Refill: 0 - ondansetron (ZOFRAN-ODT) 4 MG disintegrating tablet; Take 1 tablet (4 mg total) by mouth every 8 (eight) hours as needed for nausea or vomiting.  Dispense: 20 tablet; Refill: 0  Supportive measures and OTC medications reviewed. Rx Zofran and Loperamide to use as directed. BRAT diet once up to eating. Strict ER precautions reviewed.  Follow Up Instructions: I discussed the assessment and treatment plan with the patient. The patient was provided an opportunity to ask questions and all were answered. The patient agreed with the plan and demonstrated an understanding of the instructions.  A copy of instructions were sent to the patient via MyChart unless otherwise noted below.   The patient was advised to  call back or seek an in-person evaluation if the symptoms worsen or if the condition fails to improve as anticipated.    Joseph Velma Lunger, PA-C

## 2024-05-30 NOTE — Patient Instructions (Signed)
 Joseph LABOR Jerez, thank you for joining [name], PA-C for today's virtual visit.  While this provider is not your primary care provider (PCP), if your PCP is located in our provider database this encounter information will be shared with them immediately following your visit.   A [facility] MyChart account gives you access to today's visit and all your visits, tests, and labs performed at [facility]  click here if you don't have a [facility] MyChart account or go to [email or web address]  Consent: (Patient) Joseph Collier provided verbal consent for this virtual visit at the beginning of the encounter.  Current Medications:  Current Outpatient Medications:    loperamide (IMODIUM) 2 MG capsule, Take 4 mg once, then 2 mg after each loose stool up to total of 16 mg/day., Disp: 30 capsule, Rfl: 0   ondansetron (ZOFRAN-ODT) 4 MG disintegrating tablet, Take 1 tablet (4 mg total) by mouth every 8 (eight) hours as needed for nausea or vomiting., Disp: 20 tablet, Rfl: 0   amLODipine  (NORVASC ) 10 MG tablet, Take 1 tablet (10 mg total) by mouth daily., Disp: 90 tablet, Rfl: 2   aspirin EC 81 MG tablet, Take 81 mg by mouth daily., Disp: , Rfl:    dapagliflozin  propanediol (FARXIGA ) 10 MG TABS tablet, Take 1 tablet (10 mg total) by mouth daily., Disp: 90 tablet, Rfl: 1   fluticasone  (FLONASE ) 50 MCG/ACT nasal spray, Place 2 sprays into both nostrils daily., Disp: 16 g, Rfl: 1   glipiZIDE  (GLUCOTROL ) 10 MG tablet, Take 1 tablet (10 mg total) by mouth 2 (two) times daily before a meal., Disp: 180 tablet, Rfl: 1   glucose blood test strip, 1 each by Other route in the morning, at noon, and at bedtime. Use as instructed, Disp: 100 each, Rfl: 12   lisinopril  (ZESTRIL ) 40 MG tablet, Take 1 tablet (40 mg total) by mouth daily., Disp: 90 tablet, Rfl: 1   metFORMIN  (GLUCOPHAGE ) 1000 MG tablet, TAKE 1 TABLET BY MOUTH IN THE MORNING AND 1 TABLET BY MOUTH IN THE EVENING WITH MEALS, Disp:  180 tablet, Rfl: 1   Multiple Vitamin (MULTIVITAMIN) tablet, Take 1 tablet by mouth daily., Disp: , Rfl:    omeprazole  (PRILOSEC) 20 MG capsule, Take 1 capsule by mouth once daily, Disp: 90 capsule, Rfl: 0   sildenafil  (REVATIO ) 20 MG tablet, TAKE ONE TO THREE TABLETS BY MOUTH PRIOR TO INTERCOURSE AS NEEDED, Disp: 30 tablet, Rfl: 0   simvastatin  (ZOCOR ) 20 MG tablet, Take 1 tablet (20 mg total) by mouth at bedtime., Disp: 90 tablet, Rfl: 1   TRULICITY  4.5 MG/0.5ML SOAJ, INJECT 4.5 MG INTO THE SKIN ONCE A WEEK, Disp: 12 mL, Rfl: 0   Medications ordered in this encounter:  Meds ordered this encounter  Medications   loperamide (IMODIUM) 2 MG capsule    Sig: Take 4 mg once, then 2 mg after each loose stool up to total of 16 mg/day.    Dispense:  30 capsule    Refill:  0    Supervising Provider:   LAMPTEY, PHILIP O [[id number]]   ondansetron (ZOFRAN-ODT) 4 MG disintegrating tablet    Sig: Take 1 tablet (4 mg total) by mouth every 8 (eight) hours as needed for nausea or vomiting.    Dispense:  20 tablet    Refill:  0    Supervising Provider:   BLAISE ALEENE KIDD [phone]     *If you need refills on other medications prior to your next appointment, please contact  your pharmacy*  Follow-Up: Call back or seek an in-person evaluation if the symptoms worsen or if the condition fails to improve as anticipated.  [facility] [phone]  Other Instructions Stay hydrated and rest. Once you feel up to eating, keep a very bland diet. Take the prescribed medications as directed for nausea and frequent, loose stools.  If you note any non-resolving, new, or worsening symptoms despite treatment, please seek an in-person evaluation ASAP.   Viral Gastroenteritis, Adult  Viral gastroenteritis is also known as the stomach flu. This condition may affect your stomach, your small intestine, and your large intestine. It can cause sudden watery poop (diarrhea), fever, and vomiting. This  condition is caused by certain germs (viruses). These germs can be passed from person to person very easily (are contagious). Having watery poop and vomiting can make you feel weak and cause you to not have enough water in your body (get dehydrated). This can make you tired and thirsty, make you have a dry mouth, and make it so you pee (urinate) less often. It is important to replace the fluids that you lose from having watery poop and vomiting. What are the causes? You can get sick by catching germs from other people. You can also get sick by: Eating food, drinking water, or touching a surface that has the germs on it (is contaminated). Sharing utensils or other personal items with a person who is sick. What increases the risk? Having a weak body defense system (immune system). Living with one or more children who are younger than 2 years. Living in a nursing home. Going on cruise ships. What are the signs or symptoms? Symptoms of this condition start suddenly. Symptoms may last for a few days or for as long as a week. Common symptoms include: Watery poop. Vomiting. Other symptoms include: Fever. Headache. Feeling tired (fatigue). Pain in the belly (abdomen). Chills. Feeling weak. Feeling like you may vomit (nauseous). Muscle aches. Not feeling hungry. How is this treated? This condition typically goes away on its own. The focus of treatment is to replace the fluids that you lose. This condition may be treated with: An ORS (oral rehydration solution). This is a drink that helps you replace fluids and minerals your body lost. It is sold at pharmacies and stores. Medicines to help with your symptoms. Probiotic supplements to reduce symptoms of watery poop. Fluids given through an IV tube, if needed. Older adults and people with other diseases or a weak body defense system are at higher risk for not having enough water in the body. Follow these instructions at home: Eating and  drinking  Take an ORS as told by your doctor. Drink clear fluids in small amounts as you are able. Clear fluids include: Water. Ice chips. Fruit juice that has water added to it (is diluted). Low-calorie sports drinks. Drink enough fluid to keep your pee (urine) pale yellow. Eat small amounts of healthy foods every 3-4 hours as you are able. This may include whole grains, fruits, vegetables, lean meats, and yogurt. Avoid fluids that have a lot of sugar or caffeine in them. This includes energy drinks, sports drinks, and soda. Avoid spicy or fatty foods. Avoid alcohol. General instructions  Wash your hands often. This is very important after you have watery poop or you vomit. If you cannot use soap and water, use hand sanitizer. Make sure that all people in your home wash their hands well and often. Take over-the-counter and prescription medicines only  as told by your doctor. Rest at home while you get better. Watch your condition for any changes. Take a warm bath to help with any burning or pain from having watery poop. Keep all follow-up visits. Contact a doctor if: You cannot keep fluids down. Your symptoms get worse. You have new symptoms. You feel light-headed or dizzy. You have muscle cramps. Get help right away if: You have chest pain. You have trouble breathing, or you are breathing very fast. You have a fast heartbeat. You feel very weak or you faint. You have a very bad headache, a stiff neck, or both. You have a rash. You have very bad pain, cramping, or bloating in your belly. Your skin feels cold and clammy. You feel mixed up (confused). You have pain when you pee. You have signs of not having enough water in the body, such as: Dark pee, hardly any pee, or no pee. Cracked lips. Dry mouth. Sunken eyes. Feeling very sleepy. Feeling weak. You have signs of bleeding, such as: You see blood in your vomit. Your vomit looks like coffee grounds. You have bloody or  black poop or poop that looks like tar. These symptoms may be an emergency. Get help right away. Call 911. Do not wait to see if the symptoms will go away. Do not drive yourself to the hospital. Summary Viral gastroenteritis is also known as the stomach flu. This condition can cause sudden watery poop (diarrhea), fever, and vomiting. These germs can be passed from person to person very easily. Take an ORS (oral rehydration solution) as told by your doctor. This is a drink that is sold at pharmacies and stores. Wash your hands often, especially after having watery poop or vomiting. If you cannot use soap and water, use hand sanitizer. This information is not intended to replace advice given to you by your health care provider. Make sure you discuss any questions you have with your health care provider. Document Revised: 06/02/2021 Document Reviewed: 06/02/2021 Elsevier Patient Education  2024 Elsevier Inc.   If you have been instructed to have an in-person evaluation today at a local Urgent Care facility, please use the link below. It will take you to a list of all of our available [facility], including address, phone number and hours of operation. Please do not delay care.  [facility]  If you or a family member do not have a primary care provider, use the link below to schedule a visit and establish care. When you choose a [facility] primary care physician or advanced practice provider, you gain a long-term partner in health. Find a Primary Care Provider  Learn more about [facility]'s in-office and virtual care options:  - Get Care Now

## 2024-06-08 ENCOUNTER — Other Ambulatory Visit: Payer: Self-pay

## 2024-06-08 DIAGNOSIS — K219 Gastro-esophageal reflux disease without esophagitis: Secondary | ICD-10-CM

## 2024-06-13 DIAGNOSIS — H2512 Age-related nuclear cataract, left eye: Secondary | ICD-10-CM | POA: Diagnosis not present

## 2024-06-13 DIAGNOSIS — Z961 Presence of intraocular lens: Secondary | ICD-10-CM | POA: Diagnosis not present

## 2024-06-18 ENCOUNTER — Other Ambulatory Visit: Payer: Self-pay

## 2024-06-18 DIAGNOSIS — E1169 Type 2 diabetes mellitus with other specified complication: Secondary | ICD-10-CM

## 2024-07-06 ENCOUNTER — Other Ambulatory Visit: Payer: Self-pay

## 2024-07-06 DIAGNOSIS — E1159 Type 2 diabetes mellitus with other circulatory complications: Secondary | ICD-10-CM

## 2024-07-06 DIAGNOSIS — E1169 Type 2 diabetes mellitus with other specified complication: Secondary | ICD-10-CM

## 2024-07-07 ENCOUNTER — Other Ambulatory Visit: Payer: Self-pay | Admitting: *Deleted

## 2024-07-07 DIAGNOSIS — E1169 Type 2 diabetes mellitus with other specified complication: Secondary | ICD-10-CM

## 2024-07-07 MED ORDER — METFORMIN HCL 1000 MG PO TABS: ORAL_TABLET | ORAL | 1 refills | Status: AC

## 2024-07-08 ENCOUNTER — Other Ambulatory Visit: Payer: Self-pay | Admitting: Family Medicine

## 2024-07-08 DIAGNOSIS — E1159 Type 2 diabetes mellitus with other circulatory complications: Secondary | ICD-10-CM

## 2024-07-11 ENCOUNTER — Other Ambulatory Visit

## 2024-07-11 DIAGNOSIS — E785 Hyperlipidemia, unspecified: Secondary | ICD-10-CM | POA: Diagnosis not present

## 2024-07-11 DIAGNOSIS — I152 Hypertension secondary to endocrine disorders: Secondary | ICD-10-CM | POA: Diagnosis not present

## 2024-07-11 DIAGNOSIS — E1169 Type 2 diabetes mellitus with other specified complication: Secondary | ICD-10-CM

## 2024-07-11 DIAGNOSIS — E1159 Type 2 diabetes mellitus with other circulatory complications: Secondary | ICD-10-CM

## 2024-07-12 ENCOUNTER — Ambulatory Visit: Payer: Self-pay

## 2024-07-12 LAB — HEMOGLOBIN A1C
Est. average glucose Bld gHb Est-mCnc: 126 mg/dL
Hgb A1c MFr Bld: 6 % — ABNORMAL HIGH (ref 4.8–5.6)

## 2024-07-12 LAB — COMPREHENSIVE METABOLIC PANEL WITH GFR
ALT: 17 IU/L (ref 0–44)
AST: 18 IU/L (ref 0–40)
Albumin: 4.7 g/dL (ref 3.8–4.8)
Alkaline Phosphatase: 61 IU/L (ref 47–123)
BUN/Creatinine Ratio: 12 (ref 10–24)
BUN: 12 mg/dL (ref 8–27)
Bilirubin Total: 0.6 mg/dL (ref 0.0–1.2)
CO2: 22 mmol/L (ref 20–29)
Calcium: 9.9 mg/dL (ref 8.6–10.2)
Chloride: 102 mmol/L (ref 96–106)
Creatinine, Ser: 0.99 mg/dL (ref 0.76–1.27)
Globulin, Total: 2.4 g/dL (ref 1.5–4.5)
Glucose: 81 mg/dL (ref 70–99)
Potassium: 4.3 mmol/L (ref 3.5–5.2)
Sodium: 140 mmol/L (ref 134–144)
Total Protein: 7.1 g/dL (ref 6.0–8.5)
eGFR: 81 mL/min/1.73 (ref >59)

## 2024-07-12 LAB — CBC WITH DIFFERENTIAL/PLATELET
Basophils Absolute: 0.1 x10E3/uL (ref 0.0–0.2)
Basos: 1 %
EOS (ABSOLUTE): 0.2 x10E3/uL (ref 0.0–0.4)
Eos: 3 %
Hematocrit: 45.2 % (ref 37.5–51.0)
Hemoglobin: 14.8 g/dL (ref 13.0–17.7)
Immature Grans (Abs): 0 x10E3/uL (ref 0.0–0.1)
Immature Granulocytes: 0 %
Lymphocytes Absolute: 2.4 x10E3/uL (ref 0.7–3.1)
Lymphs: 29 %
MCH: 29.2 pg (ref 26.6–33.0)
MCHC: 32.7 g/dL (ref 31.5–35.7)
MCV: 89 fL (ref 79–97)
Monocytes Absolute: 0.7 x10E3/uL (ref 0.1–0.9)
Monocytes: 9 %
Neutrophils Absolute: 4.9 x10E3/uL (ref 1.4–7.0)
Neutrophils: 58 %
Platelets: 366 x10E3/uL (ref 150–450)
RBC: 5.06 x10E6/uL (ref 4.14–5.80)
RDW: 13 % (ref 11.6–15.4)
WBC: 8.3 x10E3/uL (ref 3.4–10.8)

## 2024-07-12 LAB — LIPID PANEL
Chol/HDL Ratio: 2.7 ratio (ref 0.0–5.0)
Cholesterol, Total: 96 mg/dL — ABNORMAL LOW (ref 100–199)
HDL: 36 mg/dL — ABNORMAL LOW (ref >39)
LDL Chol Calc (NIH): 47 mg/dL (ref 0–99)
Triglycerides: 53 mg/dL (ref 0–149)
VLDL Cholesterol Cal: 13 mg/dL (ref 5–40)

## 2024-07-21 ENCOUNTER — Ambulatory Visit

## 2024-07-21 VITALS — BP 118/64 | HR 83 | Temp 98.3°F | Ht 68.9 in | Wt 216.1 lb

## 2024-07-21 DIAGNOSIS — I152 Hypertension secondary to endocrine disorders: Secondary | ICD-10-CM | POA: Diagnosis not present

## 2024-07-21 DIAGNOSIS — Z7984 Long term (current) use of oral hypoglycemic drugs: Secondary | ICD-10-CM | POA: Diagnosis not present

## 2024-07-21 DIAGNOSIS — E1159 Type 2 diabetes mellitus with other circulatory complications: Secondary | ICD-10-CM | POA: Diagnosis not present

## 2024-07-21 DIAGNOSIS — E1169 Type 2 diabetes mellitus with other specified complication: Secondary | ICD-10-CM | POA: Diagnosis not present

## 2024-07-21 DIAGNOSIS — E785 Hyperlipidemia, unspecified: Secondary | ICD-10-CM | POA: Diagnosis not present

## 2024-07-21 NOTE — Patient Instructions (Signed)
 VISIT SUMMARY: You had a follow-up visit to manage your hypertension, type 2 diabetes, and other health conditions. Your overall health is well-managed, with significant weight loss and improved blood sugar levels.  YOUR PLAN: WEIGHT LOSS AND LIFESTYLE MODIFICATION: You have lost 18 pounds since your last visit, now weighing 216 pounds. -Continue your current weight loss efforts.  TYPE 2 DIABETES MELLITUS: Your blood sugar control has improved, with your A1c now at 6.0.  -Continue taking Trulicity , metformin , glipizide , and Farxiga  as prescribed. -Monitor for low blood sugar, especially if you miss meals. -Consider reducing the glipizide  dose in 3-4 months if your A1c remains stable.  HYPERTENSION: Your blood pressure is well-controlled. -Continue taking your current blood pressure medication, lisinopril .  HYPERLIPIDEMIA: Your cholesterol levels are slightly below normal. -Continue taking simvastatin  as prescribed. -Recheck your cholesterol levels in a few months.  GASTROESOPHAGEAL REFLUX DISEASE: You are managing your heartburn with omeprazole  as needed. -Continue taking omeprazole  as needed for heartburn.  IMMUNIZATION STATUS: You have received flu, pneumonia, shingles, and tetanus vaccinations but have not yet received the COVID vaccine. -You are eligible for the COVID vaccine. Please consider getting vaccinated.  GENERAL HEALTH MAINTENANCE: Your overall health is well-managed with normal blood pressure, blood counts, kidney and liver function, and electrolytes. -Schedule a follow-up appointment in 3-4 months for blood work and medication review.  If you have any problems before your next visit feel free to message me via MyChart (minor issues or questions) or call the office, otherwise you may reach out to schedule an office visit.  Thank you! Saddie Sacks, PA-C

## 2024-07-24 NOTE — Assessment & Plan Note (Signed)
 Last lipid panel: LDL 47, HDL 36, Trig 53. Continue simvastatin  20 mg daily for now. If total cholesterol continues to decrease, may be able to decrease simvastatin  to 10 mg in future.

## 2024-07-24 NOTE — Progress Notes (Signed)
 Established Patient Office Visit  Subjective   Patient ID: Joseph Collier, male    DOB: 1950-10-18  Age: 73 y.o. MRN: [id number]  Chief Complaint  Patient presents with   Medical Management of Chronic Issues    HPI   History of Present Illness   Joseph Collier is a 73 year old male with hypertension and type 2 diabetes who presents for routine follow-up and medication management.  Weight loss and lifestyle modification - Actively working on weight loss - Lost 18 pounds since last visit, now weighing 216 pounds (previously 234 pounds)  Type 2 diabetes mellitus management and glycemic control - Current medications: Trulicity , metformin  1000 mg twice daily, glipizide  10 mg twice daily, Farxiga  - Hemoglobin A1c improved to 6.0 (previously 7.0 in July) - Episodes of hypoglycemia characterized by shakiness, particularly if he forgets to eat at work   Gastroesophageal reflux disease - Continues to use omeprazole  as needed for heartburn  Immunization status - Received flu, pneumonia, shingles, and tetanus vaccinations - Eligible for COVID vaccine but has not yet received it          ROS Per HPI.    Objective:     BP 118/64   Pulse 83   Temp 98.3 F (36.8 C) (Oral)   Ht 5' 8.9 (1.75 m)   Wt 216 lb 1.3 oz (98 kg)   SpO2 97%   BMI 32.00 kg/m    Physical Exam Constitutional:      General: He is not in acute distress.    Appearance: Normal appearance.  Cardiovascular:     Rate and Rhythm: Normal rate and regular rhythm.     Heart sounds: Normal heart sounds. No murmur heard.    No friction rub. No gallop.  Pulmonary:     Effort: Pulmonary effort is normal. No respiratory distress.     Breath sounds: Normal breath sounds.  Musculoskeletal:        General: No swelling.     Cervical back: Neck supple.  Lymphadenopathy:     Cervical: No cervical adenopathy.  Skin:    General: Skin is warm and dry.  Neurological:     General: No focal deficit  present.     Mental Status: He is alert.  Psychiatric:        Mood and Affect: Mood normal.        Behavior: Behavior normal.        Thought Content: Thought content normal.      No results found for any visits on 07/21/24.  Last CBC Lab Results  Component Value Date   WBC 8.3 07/11/2024   HGB 14.8 07/11/2024   HCT 45.2 07/11/2024   MCV 89 07/11/2024   MCH 29.2 07/11/2024   RDW 13.0 07/11/2024   PLT 366 07/11/2024   Last metabolic panel Lab Results  Component Value Date   GLUCOSE 81 07/11/2024   NA 140 07/11/2024   K 4.3 07/11/2024   CL 102 07/11/2024   CO2 22 07/11/2024   BUN 12 07/11/2024   CREATININE 0.99 07/11/2024   EGFR 81 07/11/2024   CALCIUM 9.9 07/11/2024   PROT 7.1 07/11/2024   ALBUMIN 4.7 07/11/2024   LABGLOB 2.4 07/11/2024   AGRATIO 2.4 (H) 01/13/2023   BILITOT 0.6 07/11/2024   ALKPHOS 61 07/11/2024   AST 18 07/11/2024   ALT 17 07/11/2024   Last lipids Lab Results  Component Value Date   CHOL 96 (L) 07/11/2024   HDL 36 (L)  07/11/2024   LDLCALC 47 07/11/2024   TRIG 53 07/11/2024   CHOLHDL 2.7 07/11/2024   Last hemoglobin A1c Lab Results  Component Value Date   HGBA1C 6.0 (H) 07/11/2024   Last thyroid  functions Lab Results  Component Value Date   TSH 2.080 02/15/2024   FREET4 1.39 06/16/2019   Last vitamin D  Lab Results  Component Value Date   VD25OH 49.5 06/16/2019      The ASCVD Risk score (Arnett DK, et al., 2019) failed to calculate for the following reasons:   The valid total cholesterol range is 130 to 320 mg/dL    Assessment & Plan:   Type 2 diabetes mellitus with other specified complication, without long-term current use of insulin  (HCC) Assessment & Plan: A1c improved to 6.0. No hypoglycemia unless meals missed. Current medications: Trulicity , metformin  1000 mg BID, glipizide  10 mg BID,  Farxiga  10 mg every day.  - Continue current diabetes medications. - Monitor for hypoglycemia, especially if meals missed. -  Consider reducing glipizide  dose in 3 months if A1c stable.   Hypertension associated with diabetes (HCC) Assessment & Plan: BP Goal < 130/80. At goal in office today. - Continue lisinopril  40 mg and amlodipine  10 mg. - Monitor blood pressure at home 3-4 times a week.   Hyperlipidemia associated with type 2 diabetes mellitus (HCC) Assessment & Plan: Last lipid panel: LDL 47, HDL 36, Trig 53. Continue simvastatin  20 mg daily for now. If total cholesterol continues to decrease, may be able to decrease simvastatin  to 10 mg in future.      Return in about 4 months (around 11/19/2024) for HTN, DM, HLD.    Joseph JULIANNA Sacks, PA-C

## 2024-07-24 NOTE — Assessment & Plan Note (Signed)
 A1c improved to 6.0. No hypoglycemia unless meals missed. Current medications: Trulicity , metformin  1000 mg BID, glipizide  10 mg BID,  Farxiga  10 mg every day.  - Continue current diabetes medications. - Monitor for hypoglycemia, especially if meals missed. - Consider reducing glipizide  dose in 3 months if A1c stable.

## 2024-07-24 NOTE — Assessment & Plan Note (Signed)
 BP Goal < 130/80. At goal in office today. - Continue lisinopril  40 mg and amlodipine  10 mg. - Monitor blood pressure at home 3-4 times a week.

## 2024-09-04 ENCOUNTER — Other Ambulatory Visit: Payer: Self-pay

## 2024-09-04 ENCOUNTER — Other Ambulatory Visit: Payer: Self-pay | Admitting: Family Medicine

## 2024-09-04 DIAGNOSIS — K219 Gastro-esophageal reflux disease without esophagitis: Secondary | ICD-10-CM

## 2024-09-04 DIAGNOSIS — I152 Hypertension secondary to endocrine disorders: Secondary | ICD-10-CM

## 2024-09-07 ENCOUNTER — Other Ambulatory Visit: Payer: Self-pay

## 2024-09-07 DIAGNOSIS — E1169 Type 2 diabetes mellitus with other specified complication: Secondary | ICD-10-CM

## 2024-09-11 ENCOUNTER — Other Ambulatory Visit (HOSPITAL_COMMUNITY): Payer: Self-pay

## 2024-09-11 ENCOUNTER — Telehealth: Payer: Self-pay

## 2024-09-11 NOTE — Telephone Encounter (Signed)
 Pharmacy Patient Advocate Encounter   Received notification from [facility] KEY that prior authorization for TRULICITY  4.5 MG/0.5ML SOAJ  is required/requested.   Insurance verification completed.   The patient is insured through [facility].   Per test claim: PA required; PA submitted to above mentioned insurance via Latent Key/confirmation #/EOC [id number] Status is pending  Per test claim 4 pen at 4.5ml weekly equals 28 days supply Provide Exception Process Printed Notice Maximum Daily Dose of [phone]

## 2024-09-12 ENCOUNTER — Other Ambulatory Visit (HOSPITAL_COMMUNITY): Payer: Self-pay

## 2024-09-14 ENCOUNTER — Other Ambulatory Visit (HOSPITAL_COMMUNITY): Payer: Self-pay

## 2024-09-14 NOTE — Telephone Encounter (Signed)
 Pt informed of below.

## 2024-09-14 NOTE — Telephone Encounter (Signed)
 Pharmacy Patient Advocate Encounter  Received notification from [facility] MEDICARE that Prior Authorization for Trulicity  4.5MG /0.5ML auto-injectors  has been APPROVED from 09/13/2024 to 08/16/2025. Unable to obtain price due to refill too soon rejection, last fill date 09/13/2024 next available fill date04/08/2024 90 DAYS SUPPLY   PA #/Case ID/Reference #: [id number]

## 2024-11-14 ENCOUNTER — Other Ambulatory Visit

## 2024-11-21 ENCOUNTER — Ambulatory Visit
